# Patient Record
Sex: Female | Born: 1960 | Race: White | Hispanic: No | Marital: Married
Health system: Southern US, Academic
[De-identification: ages and names within clinical notes are randomized; demographics above are authoritative.]

## PROBLEM LIST (undated history)

## (undated) DIAGNOSIS — E039 Hypothyroidism, unspecified: Secondary | ICD-10-CM

## (undated) DIAGNOSIS — M329 Systemic lupus erythematosus, unspecified: Secondary | ICD-10-CM

## (undated) DIAGNOSIS — R569 Unspecified convulsions: Secondary | ICD-10-CM

## (undated) DIAGNOSIS — F32A Depression, unspecified: Secondary | ICD-10-CM

## (undated) DIAGNOSIS — R262 Difficulty in walking, not elsewhere classified: Secondary | ICD-10-CM

## (undated) DIAGNOSIS — E079 Disorder of thyroid, unspecified: Secondary | ICD-10-CM

## (undated) DIAGNOSIS — Z5189 Encounter for other specified aftercare: Secondary | ICD-10-CM

## (undated) DIAGNOSIS — G629 Polyneuropathy, unspecified: Secondary | ICD-10-CM

## (undated) DIAGNOSIS — IMO0002 Reserved for concepts with insufficient information to code with codable children: Secondary | ICD-10-CM

## (undated) DIAGNOSIS — S72402A Unspecified fracture of lower end of left femur, initial encounter for closed fracture: Secondary | ICD-10-CM

## (undated) DIAGNOSIS — D2121 Benign neoplasm of connective and other soft tissue of right lower limb, including hip: Secondary | ICD-10-CM

## (undated) DIAGNOSIS — G2581 Restless legs syndrome: Secondary | ICD-10-CM

## (undated) DIAGNOSIS — K3184 Gastroparesis: Secondary | ICD-10-CM

## (undated) HISTORY — PX: HERNIA REPAIR: SHX51

## (undated) HISTORY — PX: GASTRIC BYPASS: SHX52

## (undated) HISTORY — DX: Restless legs syndrome: G25.81

## (undated) HISTORY — PX: CHOLECYSTECTOMY: SHX55

## (undated) HISTORY — DX: Encounter for other specified aftercare: Z51.89

## (undated) HISTORY — DX: Unspecified fracture of lower end of left femur, initial encounter for closed fracture: S72.402A

## (undated) HISTORY — DX: Polyneuropathy, unspecified: G62.9

## (undated) HISTORY — PX: WISDOM TOOTH EXTRACTION: SHX21

## (undated) HISTORY — DX: Difficulty in walking, not elsewhere classified: R26.2

## (undated) HISTORY — DX: Benign neoplasm of connective and other soft tissue of right lower limb, including hip: D21.21

---

## 1998-01-20 ENCOUNTER — Ambulatory Visit (INDEPENDENT_AMBULATORY_CARE_PROVIDER_SITE_OTHER): Payer: Self-pay | Admitting: EXTERNAL

## 1998-04-19 ENCOUNTER — Ambulatory Visit (INDEPENDENT_AMBULATORY_CARE_PROVIDER_SITE_OTHER): Payer: Self-pay | Admitting: EXTERNAL

## 1998-09-01 ENCOUNTER — Ambulatory Visit (INDEPENDENT_AMBULATORY_CARE_PROVIDER_SITE_OTHER): Payer: Self-pay | Admitting: EXTERNAL

## 1999-02-09 ENCOUNTER — Ambulatory Visit (INDEPENDENT_AMBULATORY_CARE_PROVIDER_SITE_OTHER): Payer: Self-pay | Admitting: EXTERNAL

## 2000-02-04 ENCOUNTER — Ambulatory Visit (HOSPITAL_COMMUNITY): Payer: Self-pay

## 2000-07-17 ENCOUNTER — Ambulatory Visit (INDEPENDENT_AMBULATORY_CARE_PROVIDER_SITE_OTHER): Payer: Self-pay | Admitting: Neurological Surgery

## 2005-10-27 ENCOUNTER — Inpatient Hospital Stay (HOSPITAL_COMMUNITY): Payer: Self-pay | Admitting: Neurology

## 2013-04-29 ENCOUNTER — Other Ambulatory Visit (HOSPITAL_COMMUNITY): Payer: Self-pay | Admitting: EXTERNAL

## 2013-05-14 ENCOUNTER — Other Ambulatory Visit (HOSPITAL_COMMUNITY): Payer: Self-pay

## 2013-05-26 ENCOUNTER — Ambulatory Visit
Admission: RE | Admit: 2013-05-26 | Discharge: 2013-05-26 | Disposition: A | Discharge status: Home / Self Care | Source: Ambulatory Visit | Attending: EXTERNAL | Admitting: EXTERNAL

## 2013-05-26 DIAGNOSIS — H9319 Tinnitus, unspecified ear: Secondary | ICD-10-CM | POA: Insufficient documentation

## 2013-05-26 MED ORDER — IOPAMIDOL 370 MG IODINE/ML (76 %) INTRAVENOUS SOLUTION
140.00 mL | INTRAVENOUS | Status: AC
Start: 2013-05-26 — End: 2013-05-26
  Administered 2013-05-26: 140 mL via INTRAVENOUS

## 2013-09-05 ENCOUNTER — Inpatient Hospital Stay (HOSPITAL_COMMUNITY): Payer: Self-pay | Admitting: INTERNAL MEDICINE

## 2020-09-15 ENCOUNTER — Encounter: Payer: Self-pay | Admitting: Psychiatry

## 2020-09-15 ENCOUNTER — Emergency Department: Payer: 59

## 2020-09-15 ENCOUNTER — Inpatient Hospital Stay
Admission: EM | Admit: 2020-09-15 | Discharge: 2020-09-18 | DRG: 871 | Disposition: A | Discharge status: Home / Self Care | Payer: 59 | Attending: Internal Medicine | Admitting: Internal Medicine

## 2020-09-15 ENCOUNTER — Other Ambulatory Visit: Payer: Self-pay

## 2020-09-15 DIAGNOSIS — Z79890 Hormone replacement therapy (postmenopausal): Secondary | ICD-10-CM | POA: Diagnosis not present

## 2020-09-15 DIAGNOSIS — R112 Nausea with vomiting, unspecified: Secondary | ICD-10-CM

## 2020-09-15 DIAGNOSIS — K529 Noninfective gastroenteritis and colitis, unspecified: Secondary | ICD-10-CM | POA: Diagnosis not present

## 2020-09-15 DIAGNOSIS — R569 Unspecified convulsions: Secondary | ICD-10-CM | POA: Diagnosis not present

## 2020-09-15 DIAGNOSIS — R197 Diarrhea, unspecified: Secondary | ICD-10-CM | POA: Diagnosis present

## 2020-09-15 DIAGNOSIS — Z888 Allergy status to other drugs, medicaments and biological substances status: Secondary | ICD-10-CM | POA: Diagnosis not present

## 2020-09-15 DIAGNOSIS — A419 Sepsis, unspecified organism: Secondary | ICD-10-CM | POA: Diagnosis present

## 2020-09-15 DIAGNOSIS — G40909 Epilepsy, unspecified, not intractable, without status epilepticus: Secondary | ICD-10-CM | POA: Diagnosis not present

## 2020-09-15 DIAGNOSIS — G40409 Other generalized epilepsy and epileptic syndromes, not intractable, without status epilepticus: Secondary | ICD-10-CM | POA: Diagnosis present

## 2020-09-15 DIAGNOSIS — Z79899 Other long term (current) drug therapy: Secondary | ICD-10-CM | POA: Diagnosis not present

## 2020-09-15 DIAGNOSIS — E039 Hypothyroidism, unspecified: Secondary | ICD-10-CM | POA: Diagnosis present

## 2020-09-15 DIAGNOSIS — E860 Dehydration: Secondary | ICD-10-CM | POA: Diagnosis present

## 2020-09-15 DIAGNOSIS — D509 Iron deficiency anemia, unspecified: Secondary | ICD-10-CM | POA: Diagnosis present

## 2020-09-15 DIAGNOSIS — E669 Obesity, unspecified: Secondary | ICD-10-CM | POA: Diagnosis present

## 2020-09-15 DIAGNOSIS — J181 Lobar pneumonia, unspecified organism: Secondary | ICD-10-CM | POA: Diagnosis not present

## 2020-09-15 DIAGNOSIS — Z9884 Bariatric surgery status: Secondary | ICD-10-CM | POA: Diagnosis not present

## 2020-09-15 DIAGNOSIS — Z20822 Contact with and (suspected) exposure to covid-19: Secondary | ICD-10-CM | POA: Diagnosis present

## 2020-09-15 DIAGNOSIS — Z6832 Body mass index (BMI) 32.0-32.9, adult: Secondary | ICD-10-CM | POA: Diagnosis not present

## 2020-09-15 DIAGNOSIS — I10 Essential (primary) hypertension: Secondary | ICD-10-CM | POA: Diagnosis present

## 2020-09-15 DIAGNOSIS — J189 Pneumonia, unspecified organism: Secondary | ICD-10-CM | POA: Diagnosis not present

## 2020-09-15 DIAGNOSIS — J690 Pneumonitis due to inhalation of food and vomit: Secondary | ICD-10-CM | POA: Diagnosis present

## 2020-09-15 HISTORY — DX: Gastroparesis: K31.84

## 2020-09-15 HISTORY — DX: Unspecified convulsions: R56.9

## 2020-09-15 LAB — URINALYSIS, COMPLETE (UACMP) WITH MICROSCOPIC
Bacteria, UA: NONE SEEN
Bilirubin Urine: NEGATIVE
Glucose, UA: NEGATIVE mg/dL
Hgb urine dipstick: NEGATIVE
Ketones, ur: NEGATIVE mg/dL
Leukocytes,Ua: NEGATIVE
Nitrite: NEGATIVE
Protein, ur: NEGATIVE mg/dL
Specific Gravity, Urine: 1.023 (ref 1.005–1.030)
pH: 7 (ref 5.0–8.0)

## 2020-09-15 LAB — CBC WITH DIFFERENTIAL/PLATELET
Abs Immature Granulocytes: 0.06 10*3/uL (ref 0.00–0.07)
Basophils Absolute: 0 10*3/uL (ref 0.0–0.1)
Basophils Relative: 0 %
Eosinophils Absolute: 0 10*3/uL (ref 0.0–0.5)
Eosinophils Relative: 0 %
HCT: 32.6 % — ABNORMAL LOW (ref 36.0–46.0)
Hemoglobin: 10.3 g/dL — ABNORMAL LOW (ref 12.0–15.0)
Immature Granulocytes: 0 %
Lymphocytes Relative: 8 %
Lymphs Abs: 1.2 10*3/uL (ref 0.7–4.0)
MCH: 26.9 pg (ref 26.0–34.0)
MCHC: 31.6 g/dL (ref 30.0–36.0)
MCV: 85.1 fL (ref 80.0–100.0)
Monocytes Absolute: 0.5 10*3/uL (ref 0.1–1.0)
Monocytes Relative: 3 %
Neutro Abs: 13.1 10*3/uL — ABNORMAL HIGH (ref 1.7–7.7)
Neutrophils Relative %: 89 %
Platelets: 341 10*3/uL (ref 150–400)
RBC: 3.83 MIL/uL — ABNORMAL LOW (ref 3.87–5.11)
RDW: 15.2 % (ref 11.5–15.5)
WBC: 14.8 10*3/uL — ABNORMAL HIGH (ref 4.0–10.5)
nRBC: 0 % (ref 0.0–0.2)

## 2020-09-15 LAB — CBG MONITORING, ED: Glucose-Capillary: 79 mg/dL (ref 70–99)

## 2020-09-15 LAB — RESPIRATORY PANEL BY RT PCR (FLU A&B, COVID)
Influenza A by PCR: NEGATIVE
Influenza B by PCR: NEGATIVE
SARS Coronavirus 2 by RT PCR: NEGATIVE

## 2020-09-15 LAB — COMPREHENSIVE METABOLIC PANEL
ALT: 20 U/L (ref 0–44)
AST: 21 U/L (ref 15–41)
Albumin: 3.6 g/dL (ref 3.5–5.0)
Alkaline Phosphatase: 119 U/L (ref 38–126)
Anion gap: 10 (ref 5–15)
BUN: 14 mg/dL (ref 6–20)
CO2: 23 mmol/L (ref 22–32)
Calcium: 8.3 mg/dL — ABNORMAL LOW (ref 8.9–10.3)
Chloride: 109 mmol/L (ref 98–111)
Creatinine, Ser: 0.88 mg/dL (ref 0.44–1.00)
GFR, Estimated: >60 mL/min (ref >60)
Glucose, Bld: 119 mg/dL — ABNORMAL HIGH (ref 70–99)
Potassium: 3.8 mmol/L (ref 3.5–5.1)
Sodium: 142 mmol/L (ref 135–145)
Total Bilirubin: 0.6 mg/dL (ref 0.3–1.2)
Total Protein: 6.7 g/dL (ref 6.5–8.1)

## 2020-09-15 LAB — LACTIC ACID, PLASMA
Lactic Acid, Venous: 2.2 mmol/L (ref 0.5–1.9)
Lactic Acid, Venous: 1.4 mmol/L (ref 0.5–1.9)
Lactic Acid, Venous: 2 mmol/L (ref 0.5–1.9)

## 2020-09-15 LAB — LIPASE, BLOOD: Lipase: 29 U/L (ref 11–51)

## 2020-09-15 LAB — PROCALCITONIN: Procalcitonin: 6.05 ng/mL

## 2020-09-15 LAB — PHOSPHORUS: Phosphorus: 3.5 mg/dL (ref 2.5–4.6)

## 2020-09-15 LAB — MAGNESIUM: Magnesium: 1.6 mg/dL — ABNORMAL LOW (ref 1.7–2.4)

## 2020-09-15 MED ORDER — SODIUM CHLORIDE 0.9 % IV BOLUS
500.0000 mL | Freq: Once | INTRAVENOUS | Status: AC
  Administered 2020-09-15: 500 mL via INTRAVENOUS

## 2020-09-15 MED ORDER — ACETAMINOPHEN 325 MG PO TABS
650.0000 mg | ORAL_TABLET | Freq: Four times a day (QID) | ORAL | Status: DC | PRN
  Administered 2020-09-15 – 2020-09-16 (×3): 650 mg via ORAL
  Filled 2020-09-15 (×3): qty 2

## 2020-09-15 MED ORDER — MAGNESIUM SULFATE 2 GM/50ML IV SOLN: 2.0000 g | Freq: Once | INTRAVENOUS | Status: DC

## 2020-09-15 MED ORDER — ONDANSETRON HCL 4 MG/2ML IJ SOLN
4.0000 mg | Freq: Four times a day (QID) | INTRAMUSCULAR | Status: DC | PRN
  Administered 2020-09-16 – 2020-09-18 (×2): 4 mg via INTRAVENOUS
  Filled 2020-09-15 (×2): qty 2

## 2020-09-15 MED ORDER — LACTATED RINGERS IV BOLUS
1000.0000 mL | Freq: Once | INTRAVENOUS | Status: AC
  Administered 2020-09-15: 1000 mL via INTRAVENOUS

## 2020-09-15 MED ORDER — MORPHINE SULFATE (PF) 2 MG/ML IV SOLN
2.0000 mg | Freq: Once | INTRAVENOUS | Status: AC
  Administered 2020-09-15: 2 mg via INTRAVENOUS
  Filled 2020-09-15: qty 1

## 2020-09-15 MED ORDER — PROMETHAZINE HCL 25 MG PO TABS
25.0000 mg | ORAL_TABLET | Freq: Three times a day (TID) | ORAL | Status: DC | PRN
  Administered 2020-09-16 – 2020-09-18 (×6): 25 mg via ORAL
  Filled 2020-09-15 (×8): qty 1

## 2020-09-15 MED ORDER — ACETAMINOPHEN 160 MG/5ML PO SOLN
1000.0000 mg | Freq: Once | ORAL | Status: AC
  Administered 2020-09-15: 1000 mg via ORAL
  Filled 2020-09-15: qty 40.6

## 2020-09-15 MED ORDER — SODIUM CHLORIDE 0.9 % IV BOLUS
1000.0000 mL | Freq: Once | INTRAVENOUS | Status: AC
  Administered 2020-09-15: 1000 mL via INTRAVENOUS

## 2020-09-15 MED ORDER — PRAMIPEXOLE DIHYDROCHLORIDE 0.25 MG PO TABS
0.7500 mg | ORAL_TABLET | Freq: Every day | ORAL | Status: DC
  Administered 2020-09-15 – 2020-09-17 (×3): 0.75 mg via ORAL
  Filled 2020-09-15 (×4): qty 3

## 2020-09-15 MED ORDER — ONDANSETRON HCL 4 MG/2ML IJ SOLN
4.0000 mg | Freq: Once | INTRAMUSCULAR | Status: AC
  Administered 2020-09-15: 4 mg via INTRAVENOUS
  Filled 2020-09-15: qty 2

## 2020-09-15 MED ORDER — SODIUM CHLORIDE 0.9 % IV SOLN
2.0000 g | Freq: Once | INTRAVENOUS | Status: AC
  Administered 2020-09-15: 2 g via INTRAVENOUS
  Filled 2020-09-15: qty 2

## 2020-09-15 MED ORDER — ENOXAPARIN SODIUM 60 MG/0.6ML ~~LOC~~ SOLN
0.5000 mg/kg | SUBCUTANEOUS | Status: DC
  Administered 2020-09-15 – 2020-09-17 (×3): 50 mg via SUBCUTANEOUS
  Filled 2020-09-15 (×3): qty 0.6

## 2020-09-15 MED ORDER — VANCOMYCIN HCL 1250 MG/250ML IV SOLN
1250.0000 mg | Freq: Two times a day (BID) | INTRAVENOUS | Status: DC
  Administered 2020-09-16 – 2020-09-17 (×3): 1250 mg via INTRAVENOUS
  Filled 2020-09-15 (×4): qty 250

## 2020-09-15 MED ORDER — MAGNESIUM OXIDE 400 (241.3 MG) MG PO TABS
400.0000 mg | ORAL_TABLET | Freq: Two times a day (BID) | ORAL | Status: DC
  Administered 2020-09-15 – 2020-09-17 (×4): 400 mg via ORAL
  Filled 2020-09-15 (×4): qty 1

## 2020-09-15 MED ORDER — IOHEXOL 300 MG/ML  SOLN
100.0000 mL | Freq: Once | INTRAMUSCULAR | Status: AC | PRN
  Administered 2020-09-15: 100 mL via INTRAVENOUS

## 2020-09-15 MED ORDER — IOHEXOL 9 MG/ML PO SOLN
1000.0000 mL | Freq: Once | ORAL | Status: AC
  Administered 2020-09-15: 500 mL via ORAL

## 2020-09-15 MED ORDER — ZONISAMIDE 100 MG PO CAPS
100.0000 mg | ORAL_CAPSULE | Freq: Every day | ORAL | Status: DC
  Administered 2020-09-15 – 2020-09-18 (×4): 100 mg via ORAL
  Filled 2020-09-15 (×4): qty 1

## 2020-09-15 MED ORDER — SODIUM CHLORIDE 0.9 % IV SOLN
2.0000 g | Freq: Three times a day (TID) | INTRAVENOUS | Status: DC
  Administered 2020-09-15 – 2020-09-18 (×8): 2 g via INTRAVENOUS
  Filled 2020-09-15 (×10): qty 2

## 2020-09-15 MED ORDER — SODIUM CHLORIDE 0.9 % IV SOLN: 1.0000 g | Freq: Three times a day (TID) | INTRAVENOUS | Status: DC

## 2020-09-15 MED ORDER — LACTATED RINGERS IV SOLN: INTRAVENOUS | Status: AC

## 2020-09-15 MED ORDER — LEVOTHYROXINE SODIUM 50 MCG PO TABS
75.0000 ug | ORAL_TABLET | Freq: Every day | ORAL | Status: DC
  Administered 2020-09-16: 75 ug via ORAL
  Filled 2020-09-15: qty 1

## 2020-09-15 MED ORDER — VANCOMYCIN HCL 2000 MG/400ML IV SOLN
2000.0000 mg | Freq: Once | INTRAVENOUS | Status: AC
  Administered 2020-09-15: 17:00:00 2000 mg via INTRAVENOUS
  Filled 2020-09-15: qty 400

## 2020-09-15 MED ORDER — DIAZEPAM 5 MG PO TABS
10.0000 mg | ORAL_TABLET | Freq: Every day | ORAL | Status: DC
  Administered 2020-09-15 – 2020-09-17 (×3): 10 mg via ORAL
  Filled 2020-09-15 (×3): qty 2

## 2020-09-15 MED ORDER — DIAZEPAM 10 MG RE GEL: 10.0000 mg | Freq: Every day | RECTAL | Status: DC | PRN

## 2020-09-15 MED ORDER — TOPIRAMATE 100 MG PO TABS
100.0000 mg | ORAL_TABLET | Freq: Three times a day (TID) | ORAL | Status: DC
  Administered 2020-09-15 – 2020-09-18 (×8): 100 mg via ORAL
  Filled 2020-09-15 (×10): qty 1

## 2020-09-15 MED ORDER — ENOXAPARIN SODIUM 40 MG/0.4ML ~~LOC~~ SOLN: 40.0000 mg | SUBCUTANEOUS | Status: DC

## 2020-09-15 NOTE — Progress Notes (Signed)
Following for code sepsis 

## 2020-09-15 NOTE — Progress Notes (Signed)
CODE SEPSIS - PHARMACY COMMUNICATION  **Broad Spectrum Antibiotics should be administered within 1 hour of Sepsis diagnosis**  Time Code Sepsis Called/Page Received: @ 1359  Antibiotics Ordered: Cefepime and Vancomycin   Time of 1st antibiotic administration: @ 1431  Additional action taken by pharmacy: N/A  If necessary, Name of Provider/Nurse Contacted: N/A   Gardner Candle, PharmD, BCPS Clinical Pharmacist 09/15/2020 2:01 PM

## 2020-09-15 NOTE — Plan of Care (Signed)
  Problem: Education: Goal: Knowledge of General Education information will improve Description: Including pain rating scale, medication(s)/side effects and non-pharmacologic comfort measures Outcome: Progressing   Problem: Activity: Goal: Ability to tolerate increased activity will improve Outcome: Progressing   Problem: Clinical Measurements: Goal: Ability to maintain a body temperature in the normal range will improve Outcome: Progressing   Problem: Respiratory: Goal: Ability to maintain adequate ventilation will improve Outcome: Progressing   Problem: Respiratory: Goal: Ability to maintain a clear airway will improve Outcome: Progressing   Problem: Fluid Volume: Goal: Hemodynamic stability will improve Outcome: Progressing   

## 2020-09-15 NOTE — ED Notes (Signed)
MD at bedside. 

## 2020-09-15 NOTE — ED Triage Notes (Signed)
Patient arrived via EMS due to N/V per EMS and patient mother patient woke up in the middle of the night, was shaky, patient has a hx of seizures, not sure if she had a seizure. Patient is here from [place] was traveling with family. VSS

## 2020-09-15 NOTE — ED Notes (Signed)
Attempted to call report - placed on hold with no answer

## 2020-09-15 NOTE — Progress Notes (Signed)
PHARMACY -  BRIEF ANTIBIOTIC NOTE   Pharmacy has received consult(s) for Cefepime and vancomycin from an ED provider.  The patient's profile has been reviewed for ht/wt/allergies/indication/available labs.    One time order(s) placed for Vancomycin 2000mg  and Cefepime 2g IV   Further antibiotics/pharmacy consults should be ordered by admitting physician if indicated.                       Thank you, , PharmD, BCPS Clinical Pharmacist 09/15/2020 1:59 PM

## 2020-09-15 NOTE — ED Provider Notes (Signed)
Patient reports she is starting to feel improvement.  Nausea has improved no longer having any pain or discomfort.  Alert resting comfortably, patient's niece who is a Advice worker also at the bedside.  Reviewed labs with the patient and work-up to this point.   Sharyn Creamer, MD 09/15/20 1140

## 2020-09-15 NOTE — ED Provider Notes (Signed)
[facility] Emergency Department Provider Note ____________________________________________   First MD Initiated Contact with Patient 09/15/20 [phone]     (approximate)  I have reviewed the triage vital signs and the nursing notes.   HISTORY  Chief Complaint Nausea   HPI Hooria Gasparini is a 59 y.o. female history of epilepsy, seizure disorder, and gastroparesis with previous gastric bypass  Patient traveling from [place].  She reports yesterday she felt tired a little more fatigued than usual while traveling here to pick up her grandchildren she reports that she suddenly had severe abdominal cramps explosive diarrhea  yesterday afternoon, then continued her travel here and last night vomited once.  She is continued to dry heave and feels a sensation as though her bowels are moving in her left upper abdomen.  She has continued to pass gas and continue to have a couple loose nonbloody stools.  She has not noticed any fever.  Denies Covid exposure.  Does have a history of seizure disorder with severe seizures in the past, does not believe she had any seizure yesterday but says it is possible she could have a she has multiple different types of seizures but felt more like she was really having vomiting.  She does not have her medications with her but reports she obtains them at [facility] in [place]  Past Medical History:  Diagnosis Date  . Gastroparesis   . Seizures [facility])     Patient Active Problem List   Diagnosis Date Noted  . Sepsis (HCC) 09/15/2020      Prior to Admission medications   Medication Sig Start Date End Date Taking? Authorizing Provider  cyanocobalamin (,VITAMIN B-12,) 1000 MCG/ML injection Inject 1,000 mcg into the muscle every 14 (fourteen) days.   Yes [provider]  diazepam (DIASTAT ACUDIAL) 10 MG GEL Place 10 mg rectally daily as needed for seizure.   Yes [provider]  diazepam (VALIUM) 5 MG tablet Take 10 mg  by mouth at bedtime.   Yes [provider]  levothyroxine (SYNTHROID) 75 MCG tablet Take 75 mcg by mouth daily. 08/19/20  Yes [provider]  pramipexole (MIRAPEX) 0.75 MG tablet Take 0.75 mg by mouth at bedtime. 07/27/20  Yes [provider]  promethazine (PHENERGAN) 25 MG tablet Take 25 mg by mouth 3 (three) times daily as needed for nausea/vomiting. 08/15/20  Yes [provider]  topiramate (TOPAMAX) 100 MG tablet Take 100 mg by mouth 3 (three) times daily.   Yes [provider]  zonisamide (ZONEGRAN) 50 MG capsule Take 100 mg by mouth daily.  06/19/20  Yes [provider]    Allergies Reglan [metoclopramide]  No family history on file.  Social History Social History   Tobacco Use  . Smoking status: Not on file  Substance Use Topics  . Alcohol use: Not on file  . Drug use: Not on file  No active drug abuse, no alcohol use today  Review of Systems Constitutional: No fever/chills Eyes: No visual changes. ENT: No sore throat. Cardiovascular: Denies chest pain. Respiratory: Denies shortness of breath. Gastrointestinal: No abdominal pain.   Genitourinary: Negative for dysuria. Musculoskeletal: Negative for back pain. Skin: Negative for rash. Neurological: Negative for headaches, areas of focal weakness or numbness.    ____________________________________________   PHYSICAL EXAM:  VITAL SIGNS: ED Triage Vitals  Enc Vitals Group     BP 09/15/20 0956 123/67     Pulse Rate 09/15/20 0956 (!) 104     Resp  09/15/20 0956 16     Temp 09/15/20 0956 (!) 100.4 F (38 C)     Temp Source 09/15/20 0956 Oral     SpO2 09/15/20 0951 98 %     Weight 09/15/20 1006 220 lb (99.8 kg)     Height 09/15/20 1006 5\' 9"  (1.753 m)     Head Circumference --      Peak Flow --      Pain Score 09/15/20 0956 6     Pain Loc --      Pain Edu? --      Excl. in GC? --     Constitutional: Alert and oriented. Well appearing and in no acute  distress. Eyes: Conjunctivae are normal. Head: Atraumatic. Nose: No congestion/rhinnorhea. Mouth/Throat: Mucous membranes are quite dry.  Patient reports she has not had anything to eat since last night at all Neck: No stridor.  Cardiovascular: Normal rate, regular rhythm. Grossly normal heart sounds.  Good peripheral circulation. Respiratory: Normal respiratory effort.  No retractions. Lungs CTAB. Gastrointestinal: Soft and mildly tender throughout but no obvious focal discomfort. No distention.  No rebound or guarding. Musculoskeletal: No lower extremity tenderness nor edema. Neurologic:  Normal speech and language. No gross focal neurologic deficits are appreciated.  Skin:  Skin is warm, dry and intact. No rash noted. Psychiatric: Mood and affect are normal. Speech and behavior are normal.  ____________________________________________   LABS (all labs ordered are listed, but only abnormal results are displayed)  Labs Reviewed  CBC WITH DIFFERENTIAL/PLATELET - Abnormal; Notable for the following components:      Result Value   WBC 14.8 (*)    RBC 3.83 (*)    Hemoglobin 10.3 (*)    HCT 32.6 (*)    Neutro Abs 13.1 (*)    All other components within normal limits  COMPREHENSIVE METABOLIC PANEL - Abnormal; Notable for the following components:   Glucose, Bld 119 (*)    Calcium 8.3 (*)    All other components within normal limits  URINALYSIS, COMPLETE (UACMP) WITH MICROSCOPIC - Abnormal; Notable for the following components:   Color, Urine YELLOW (*)    APPearance CLEAR (*)    All other components within normal limits  LACTIC ACID, PLASMA - Abnormal; Notable for the following components:   Lactic Acid, Venous 2.2 (*)    All other components within normal limits  MAGNESIUM - Abnormal; Notable for the following components:   Magnesium 1.6 (*)    All other components within normal limits  CULTURE, BLOOD (SINGLE)  RESPIRATORY PANEL BY RT PCR (FLU A&B, COVID)  CULTURE, BLOOD  (SINGLE)  URINE CULTURE  GASTROINTESTINAL PANEL BY PCR, STOOL (REPLACES STOOL CULTURE)  GASTROINTESTINAL PANEL BY PCR, STOOL (REPLACES STOOL CULTURE)  LIPASE, BLOOD  LACTIC ACID, PLASMA  PHOSPHORUS  LACTIC ACID, PLASMA  PROCALCITONIN  CBG MONITORING, ED   ____________________________________________  ____________________________________________  RADIOLOGY  CT Head Wo Contrast  Result Date: 09/15/2020 CLINICAL DATA:  Headache.  History of seizures EXAM: CT HEAD WITHOUT CONTRAST TECHNIQUE: Contiguous axial images were obtained from the base of the skull through the vertex without intravenous contrast. COMPARISON:  None. FINDINGS: Brain: The ventricles are normal in size and configuration. There is symmetric mild frontal atrophy bilaterally. There is no intracranial mass, hemorrhage, extra-axial fluid collection, or midline shift. Brain parenchyma appears unremarkable. No demonstrable acute infarct. Vascular: No hyperdense mass. There are foci of calcification in the carotid siphon regions. Skull: Bony calvarium appears intact. Sinuses/Orbits: There is mucosal thickening involving several  ethmoid air cells. Other visualized paranasal sinuses are clear. Visualized orbits appear symmetric bilaterally. Other: Mastoid air cells are clear. IMPRESSION: Mild frontal atrophy bilaterally.  Ventricles appear normal. Brain parenchyma appears unremarkable.  No mass or hemorrhage. There are foci of arterial vascular calcification. There is mucosal thickening in several ethmoid air cells. Electronically Signed   By: Bretta BangWilliam  Woodruff III M.D.   On: 09/15/2020 13:38   CT ABDOMEN PELVIS W CONTRAST  Result Date: 09/15/2020 CLINICAL DATA:  Nausea, vomiting. EXAM: CT ABDOMEN AND PELVIS WITH CONTRAST TECHNIQUE: Multidetector CT imaging of the abdomen and pelvis was performed using the standard protocol following bolus administration of intravenous contrast. CONTRAST:  100mL OMNIPAQUE IOHEXOL 300 MG/ML  SOLN  COMPARISON:  None. FINDINGS: Lower chest: Mild airspace opacities are noted in the right lung base concerning for possible pneumonia. Moderate size sliding-type hiatal hernia is noted. Hepatobiliary: No focal liver abnormality is seen. Status post cholecystectomy. No biliary dilatation. Pancreas: Unremarkable. No pancreatic ductal dilatation or surrounding inflammatory changes. Spleen: Normal in size without focal abnormality. Adrenals/Urinary Tract: Adrenal glands are unremarkable. Kidneys are normal, without renal calculi, focal lesion, or hydronephrosis. Bladder is unremarkable. Stomach/Bowel: Status post gastric bypass. There is no evidence of bowel obstruction or inflammation. The appendix is unremarkable. Vascular/Lymphatic: Aortic atherosclerosis. No enlarged abdominal or pelvic lymph nodes. Reproductive: Uterus and bilateral adnexa are unremarkable. Other: No abdominal wall hernia or abnormality. No abdominopelvic ascites. Musculoskeletal: No acute or significant osseous findings. IMPRESSION: 1. Mild airspace opacities are noted in the right lung base concerning for possible pneumonia. 2. Moderate size sliding-type hiatal hernia. 3. Status post gastric bypass. 4. No other abnormality seen in the abdomen or pelvis. 5. Aortic atherosclerosis. Aortic Atherosclerosis (ICD10-I70.0). Electronically Signed   By: Lupita RaiderJames  Green Jr M.D.   On: 09/15/2020 13:43   DG Chest Portable 1 View  Result Date: 09/15/2020 CLINICAL DATA:  Sepsis. EXAM: PORTABLE CHEST 1 VIEW COMPARISON:  None. FINDINGS: The heart size and mediastinal contours are within normal limits. Both lungs are clear. The visualized skeletal structures are unremarkable. IMPRESSION: No active disease. Electronically Signed   By: Lupita RaiderJames  Green Jr M.D.   On: 09/15/2020 14:17    Imaging reviewed, no acute intracranial findings are denoted on CT.  Abdomen pelvis demonstrate possible findings of pneumonia but no acute intra-abdominal pathology is identified.   Incidental findings as noted above.  Chest x-ray is noted as clear ____________________________________________   PROCEDURES  Procedure(s) performed: None  Procedures  Critical Care performed: No  ____________________________________________   INITIAL IMPRESSION / ASSESSMENT AND PLAN / ED COURSE  Pertinent labs & imaging results that were available during my care of the patient were reviewed by me and considered in my medical decision making (see chart for details).   Differential diagnosis includes but is not limited to, abdominal perforation, aortic dissection, cholecystitis, appendicitis, diverticulitis, colitis, esophagitis/gastritis, kidney stone, pyelonephritis, urinary tract infection, aortic aneurysm. All are considered in decision and treatment plan. Based upon the patient's presentation and risk factors, and his presentation of nausea vomiting crampy abdominal pain loose stools sounds likely infectious in nature.  She does however have a history of previous gastric bypass and I believe CT imaging would be helpful.  She also reports associated headache, but does have a history of headaches, but in consideration other etiologies of refractory nausea vomiting and headache could be representative of central neurologic etiology such as intracranial hemorrhage, mass, etc.  This however is lower on my differential but discussed with the patient  we will proceed with imaging of the head to help exclude this, though I suspect most likely etiology be abdominal  No acute pulmonary or cardiac symptoms.  No chest pain.  Reassuring vascular exam of distal extremities     ----------------------------------------- 2:00 PM on 09/15/2020 -----------------------------------------  Patient's blood pressure seems to have dropped consistently, now 82/48.  Per her and her niece she does have a history of running low blood pressures but not in the 80s.  She is awake alert fully oriented.  Her CT scan  demonstrating possible pneumonia, have ordered chest x-ray as well to evaluate.  Covid test negative.  Elevated white count, concern for sepsis given fever, hypotension and source is still not clearly delineated though may be pulmonary patient family does report did notice she had a cough yesterday which she does not typically have.  I have placed admission request to hospitalist Lucianne Muss    ____________________________________________   FINAL CLINICAL IMPRESSION(S) / ED DIAGNOSES  Final diagnoses:  Severe sepsis (HCC)  Nausea vomiting and diarrhea        Note:  This document was prepared using Dragon voice recognition software and may include unintentional dictation errors       Sharyn Creamer, MD 09/15/20 1619

## 2020-09-15 NOTE — Progress Notes (Signed)
Notified bedside nurse of need to draw repeat lactic acid. 

## 2020-09-15 NOTE — H&P (Signed)
[facility] History and Physical   Patient: Chelsea Pena ZOX:[id number]   PCP: System, Provider Not In DOB: [birth year]   DOA: 09/15/2020   DOS: 09/15/2020   DOS: the patient was seen and examined on 09/15/2020   Patient coming from: The patient is coming from Home  Chief Complaint: Abdominal pain, diarrhea nausea and vomiting, possible seizures and headache  HPI: Chelsea BucksCindy Pounds is a 59 y.o. female with Past medical history of seizure disorder, lupus, hypothyroid,  gastroparesis status post gastric bypass, as reviewed from EMR, presented [facility] ED due to complaining of abdominal pain and diarrhea which started yesterday [place] and on the way she stopped and had huge bowel movement, she was feeling sick and did not eat much yesterday.  Patient was feeling nauseous and vomited last night.  Today morning patient was feeling sick as well nauseous and vomited and she feels like she was shaking might had an episode of seizures but she is not sure, not sure about passing out or loss of consciousness.  Patient was complaining of headache and also stated that she is being having cough for the past 1 to 2 days but no history of fever.  Patient is vaccinated against Covid 19 and she received booster dose on Monday, patient also received right hip steroid injection on Monday.   ED Course: Hypertensive, low blood pressure, fluid bolus was given in the ED as per sepsis protocol, chest x-ray consistent with right lower lobe infiltrates, WBC count 14.8, lactic acid 2.2 GI pathogen and C. difficile pending Follow procalcitonin Follow blood cultures Patient admitted for further management for sepsis as bellow     Review of Systems: as mentioned in the history of present illness.  All other systems reviewed and are negative.  Past Medical History:  Diagnosis Date  . Gastroparesis   . Seizures (HCC)    The histories are not reviewed yet. Please review them in the  "History" navigator section and refresh this SmartLink. Social History: Patient denied smoking, drug abuse and alcohol abuse.   Allergies  Allergen Reactions  . Reglan [Metoclopramide] Other (See Comments)    Tardive dyskinesia      Family history reviewed and not pertinent   Prior to Admission medications   Not on File    Physical Exam: Vitals:   09/15/20 1445 09/15/20 1500 09/15/20 1515 09/15/20 1558  BP: 100/62   (!) 106/55  Pulse: 73 73 74 70  Resp: (!) 21 19 (!) 23 17  Temp:    98.7 F (37.1 C)  TempSrc:    Oral  SpO2: 100% 100% 100% 100%  Weight:      Height:        General: alert and oriented to time, place, and person. Appear in mild distress, affect appropriate Eyes: PERRLA, Conjunctiva normal ENT: Oral Mucosa Clear, dry  Neck: No JVD, no Abnormal Mass Or lumps Cardiovascular: S1 and S2 Present, no Murmur, peripheral pulses symmetrical Respiratory: good respiratory effort, Bilateral Air entry equal and Decreased, no signs of accessory muscle use, Clear to Auscultation, no Crackles, no wheezes Abdomen: Bowel Sound present, Soft and no tenderness, no hernia Skin: no rashes  Extremities: no Pedal edema, no calf tenderness Neurologic: without any new focal findings Gait not checked due to patient safety concerns  Data Reviewed: I have personally reviewed and interpreted labs, imaging as discussed below.  CBC: Recent Labs  Lab 09/15/20 1000  WBC 14.8*  NEUTROABS 13.1*  HGB 10.3*  HCT 32.6*  MCV 85.1  PLT 341   Basic Metabolic Panel: Recent Labs  Lab 09/15/20 1000 09/15/20 1430  NA 142  --   K 3.8  --   CL 109  --   CO2 23  --   GLUCOSE 119*  --   BUN 14  --   CREATININE 0.88  --   CALCIUM 8.3*  --   MG  --  1.6*  PHOS  --  3.5   GFR: Estimated Creatinine Clearance: 86.5 mL/min (by C-G formula based on SCr of 0.88 mg/dL). Liver Function Tests: Recent Labs  Lab 09/15/20 1000  AST 21  ALT 20  ALKPHOS 119  BILITOT 0.6  PROT 6.7   ALBUMIN 3.6   Recent Labs  Lab 09/15/20 1000  LIPASE 29   No results for input(s): AMMONIA in the last 168 hours. Coagulation Profile: No results for input(s): INR, PROTIME in the last 168 hours. Cardiac Enzymes: No results for input(s): CKTOTAL, CKMB, CKMBINDEX, TROPONINI in the last 168 hours. BNP (last 3 results) No results for input(s): PROBNP in the last 8760 hours. HbA1C: No results for input(s): HGBA1C in the last 72 hours. CBG: Recent Labs  Lab 09/15/20 0948  GLUCAP 79   Lipid Profile: No results for input(s): CHOL, HDL, LDLCALC, TRIG, CHOLHDL, LDLDIRECT in the last 72 hours. Thyroid Function Tests: No results for input(s): TSH, T4TOTAL, FREET4, T3FREE, THYROIDAB in the last 72 hours. Anemia Panel: No results for input(s): VITAMINB12, FOLATE, FERRITIN, TIBC, IRON, RETICCTPCT in the last 72 hours. Urine analysis:    Component Value Date/Time   COLORURINE YELLOW (A) 09/15/2020 1246   APPEARANCEUR CLEAR (A) 09/15/2020 1246   LABSPEC 1.023 09/15/2020 1246   PHURINE 7.0 09/15/2020 1246   GLUCOSEU NEGATIVE 09/15/2020 1246   HGBUR NEGATIVE 09/15/2020 1246   BILIRUBINUR NEGATIVE 09/15/2020 1246   KETONESUR NEGATIVE 09/15/2020 1246   PROTEINUR NEGATIVE 09/15/2020 1246   NITRITE NEGATIVE 09/15/2020 1246   LEUKOCYTESUR NEGATIVE 09/15/2020 1246    Radiological Exams on Admission: CT Head Wo Contrast  Result Date: 09/15/2020 CLINICAL DATA:  Headache.  History of seizures EXAM: CT HEAD WITHOUT CONTRAST TECHNIQUE: Contiguous axial images were obtained from the base of the skull through the vertex without intravenous contrast. COMPARISON:  None. FINDINGS: Brain: The ventricles are normal in size and configuration. There is symmetric mild frontal atrophy bilaterally. There is no intracranial mass, hemorrhage, extra-axial fluid collection, or midline shift. Brain parenchyma appears unremarkable. No demonstrable acute infarct. Vascular: No hyperdense mass. There are foci of  calcification in the carotid siphon regions. Skull: Bony calvarium appears intact. Sinuses/Orbits: There is mucosal thickening involving several ethmoid air cells. Other visualized paranasal sinuses are clear. Visualized orbits appear symmetric bilaterally. Other: Mastoid air cells are clear. IMPRESSION: Mild frontal atrophy bilaterally.  Ventricles appear normal. Brain parenchyma appears unremarkable.  No mass or hemorrhage. There are foci of arterial vascular calcification. There is mucosal thickening in several ethmoid air cells. Electronically Signed   By: Bretta Bang III M.D.   On: 09/15/2020 13:38   CT ABDOMEN PELVIS W CONTRAST  Result Date: 09/15/2020 CLINICAL DATA:  Nausea, vomiting. EXAM: CT ABDOMEN AND PELVIS WITH CONTRAST TECHNIQUE: Multidetector CT imaging of the abdomen and pelvis was performed using the standard protocol following bolus administration of intravenous contrast. CONTRAST:  OMNIPAQUE IOHEXOL 300 MG/ML  SOLN COMPARISON:  None. FINDINGS: Lower chest: Mild airspace opacities are noted in the right lung base concerning for possible pneumonia. Moderate size sliding-type hiatal  hernia is noted. Hepatobiliary: No focal liver abnormality is seen. Status post cholecystectomy. No biliary dilatation. Pancreas: Unremarkable. No pancreatic ductal dilatation or surrounding inflammatory changes. Spleen: Normal in size without focal abnormality. Adrenals/Urinary Tract: Adrenal glands are unremarkable. Kidneys are normal, without renal calculi, focal lesion, or hydronephrosis. Bladder is unremarkable. Stomach/Bowel: Status post gastric bypass. There is no evidence of bowel obstruction or inflammation. The appendix is unremarkable. Vascular/Lymphatic: Aortic atherosclerosis. No enlarged abdominal or pelvic lymph nodes. Reproductive: Uterus and bilateral adnexa are unremarkable. Other: No abdominal wall hernia or abnormality. No abdominopelvic ascites. Musculoskeletal: No acute or  significant osseous findings. IMPRESSION: 1. Mild airspace opacities are noted in the right lung base concerning for possible pneumonia. 2. Moderate size sliding-type hiatal hernia. 3. Status post gastric bypass. 4. No other abnormality seen in the abdomen or pelvis. 5. Aortic atherosclerosis. Aortic Atherosclerosis (ICD10-I70.0). Electronically Signed   By: Lupita Raider M.D.   On: 09/15/2020 13:43   DG Chest Portable 1 View  Result Date: 09/15/2020 CLINICAL DATA:  Sepsis. EXAM: PORTABLE CHEST 1 VIEW COMPARISON:  None. FINDINGS: The heart size and mediastinal contours are within normal limits. Both lungs are clear. The visualized skeletal structures are unremarkable. IMPRESSION: No active disease. Electronically Signed   By: Lupita Raider M.D.   On: 09/15/2020 14:17     I reviewed all nursing notes, pharmacy notes, vitals, pertinent old records.  Assessment/Plan  Active Problems:   Sepsis (HCC)    # Sepsis could be secondary to viral gastroenteritis versus pneumonia Patient presented with abdominal pain, nausea vomiting and diarrhea and cough CT scan abdomen did not show any significant findings in the abdomen, hiatal hernia, status post gastric bypass and right lower lobe infiltrates Chest x-ray negative WBC count elevated 14.8, lactic acid 2.2 COVID-19 negative Follow C. difficile and GI pathogen F/u Repeat lactic acid level, follow-up procalcitonin level Patient was given vancomycin and cefepime in the ED which has been continued Pharmacy consulted for vancomycin dosing and trough monitoring Follow blood culture   # History of seizure disorder Resumed home medications Topamax, zonisamide and diazepam Continue seizure precautions  # Hypothyroid, continue with Synthroid    Nutrition: Soft diet easy to digest DVT Prophylaxis: Subcutaneous Lovenox  Advance goals of care discussion: Full code   Consults: None   Family Communication: family was not present at bedside,  at the time of interview.  Opportunity was given to ask question and all questions were answered satisfactorily.  Disposition: Admitted as inpatient, med-surge unit. Likely to be discharged home , in 1-2 days .  I have discussed plan of care as described above with RN and patient/family.  Severity of Illness: The appropriate patient status for this patient is INPATIENT. Inpatient status is judged to be reasonable and necessary in order to provide the required intensity of service to ensure the patient's safety. The patient's presenting symptoms, physical exam findings, and initial radiographic and laboratory data in the context of their chronic comorbidities is felt to place them at high risk for further clinical deterioration. Furthermore, it is not anticipated that the patient will be medically stable for discharge from the hospital within 2 midnights of admission. The following factors support the patient status of inpatient.   " The patient's presenting symptoms include nausea vomiting and diarrhea. " The worrisome physical exam findings include hypotension, sepsis. " The initial radiographic and laboratory data are worrisome because of limited WBC count and lactic acid. " The chronic co-morbidities include gastroparesis s/p gastric  bypass, lupus, hypothyroid, seizures.   * I certify that at the point of admission it is my clinical judgment that the patient will require inpatient hospital care spanning beyond 2 midnights from the point of admission due to high intensity of service, high risk for further deterioration and high frequency of surveillance required.*    Author: Gillis Santa, MD [facility] 09/15/2020 4:21 PM   To reach On-call, see care teams to locate the attending and reach out to them via [email or web address]. If 7PM-7AM, please contact night-coverage If you still have difficulty reaching the attending provider, please page the [facility] (Director on Call) for [facility]  on amion for assistance.

## 2020-09-15 NOTE — Progress Notes (Signed)
PHARMACIST - PHYSICIAN COMMUNICATION  CONCERNING:  Enoxaparin (Lovenox) for DVT Prophylaxis    RECOMMENDATION: Patient was prescribed enoxaprin 40mg  q24 hours for VTE prophylaxis.   Filed Weights   09/15/20 1006  Weight: 99.8 kg (220 lb)    Body mass index is 32.49 kg/m.  Estimated Creatinine Clearance: 86.5 mL/min (by C-G formula based on SCr of 0.88 mg/dL).   Based on [facility] policy patient is candidate for enoxaparin 0.5mg /kg TBW SQ every 24 hours based on BMI being >30.  DESCRIPTION: Pharmacy has adjusted enoxaparin dose per [facility] policy.  Patient is now receiving enoxaparin 50 mg every 24 hours    [facility], PharmD, BCPS Clinical Pharmacist 09/15/2020 4:02 PM

## 2020-09-15 NOTE — Progress Notes (Signed)
Pharmacy Antibiotic Note  Chelsea Pena is a 59 y.o. female admitted on 09/15/2020 with N/V and Severe sepsis.   Pharmacy has been consulted for vancomycin dosing. Patient is also receiving cefepime.   Plan: Vancomycin 2000mg  loading dose x 1, followed by Vancomycin 1250mg  IV every 12 hours. Pharmacy will monitor renal function and adjust dose as needed. Plan for trough level after 4-5 doses if vancomycin is continued.   Cefepime dose has been adjusted to 2g IV every 8 hours based on CrCl >11ml/min and sepsis indication.   Height: 5\' 9"  (175.3 cm) Weight: 99.8 kg (220 lb) IBW/kg (Calculated) : 66.2  Temp (24hrs), Avg:99.6 F (37.6 C), Min:98.8 F (37.1 C), Max:100.4 F (38 C)  Recent Labs  Lab 09/15/20 1000 09/15/20 1246  WBC 14.8*  --   CREATININE 0.88  --   LATICACIDVEN  --  2.2*    Estimated Creatinine Clearance: 86.5 mL/min (by C-G formula based on SCr of 0.88 mg/dL).    Allergies  Allergen Reactions  . Reglan [Metoclopramide]     Antimicrobials this admission: 11/11 Vancomycin >>  11/11 Cefepime  >>  Microbiology results: 11/11 BCx: pending  11/11 UCx: pending   Thank you for allowing pharmacy to be a part of this patient's care.  13/11, PharmD, BCPS Clinical Pharmacist 09/15/2020 2:49 PM

## 2020-09-15 NOTE — ED Notes (Signed)
Alease Frame (daughter) [phone]

## 2020-09-15 NOTE — ED Notes (Signed)
Medication Reconciliation Report  For Home History Technicians  HIGHLIGHTS:  1. The patient WAS personally interviewed 2. If not, what was the main source used: NOT APPLICABLE 3. Does the patient appear to take any anti-coagulation agents (e.g. warfarin, Eliquis or Xarelto): NO 4. Does the patient appear to take any anti-convulsant agents (e.g. divalproex, levetiracetam or phenytoin): YES 5. Does the patient appear to use any insulin products (e.g. Lantus, Novolin or Humalog): NO 6. Does the patient appear to take any "beta-blockers" (e.g. metoprolol, carvedilol or bisoprolol: NO  BARRIERS:  1. Were there any barriers that prevented or complicated the medication reconciliation process: NO 2. If yes, what was the primary barrier encountered: None 3. Does the patient appear compliant with prescribed medications: YES 4. Does the patient express any barriers with compliance: NO 5. What is the primary barrier the patient reports: None   NOTES:[Include any concerns, remarks or complaints the patient expresses regarding medication therapy. Any observations or other information that might be useful to the treatment team can also be included. Immediate needs or concerns should be referred to the RN or appropriate member of the treatment team.]  The patient reports taking TOPIRAMATE and ZONISAMIDE as reflected in med reconciliation tab. This differs somewhat from the directions reported from pharmacy records (100mg  BID and 50mg  QD, respectively)        , CPhT [place] [facility] [address] [place], [address] [place]  ** The above is intended solely for informational and/or communicative purposes. It should in no way be considered an endorsement of any specific treatment, therapy or action. **

## 2020-09-15 NOTE — Plan of Care (Signed)
  Problem: Education: Goal: Knowledge of General Education information will improve Description: Including pain rating scale, medication(s)/side effects and non-pharmacologic comfort measures Outcome: Progressing   Problem: Activity: Goal: Ability to tolerate increased activity will improve Outcome: Progressing   Problem: Clinical Measurements: Goal: Ability to maintain a body temperature in the normal range will improve Outcome: Progressing   Problem: Respiratory: Goal: Ability to maintain adequate ventilation will improve Outcome: Progressing Goal: Ability to maintain a clear airway will improve Outcome: Progressing   

## 2020-09-15 NOTE — ED Notes (Signed)
MD aware of hypotension

## 2020-09-16 ENCOUNTER — Encounter: Payer: Self-pay | Admitting: Student

## 2020-09-16 DIAGNOSIS — J189 Pneumonia, unspecified organism: Secondary | ICD-10-CM

## 2020-09-16 DIAGNOSIS — K529 Noninfective gastroenteritis and colitis, unspecified: Secondary | ICD-10-CM

## 2020-09-16 DIAGNOSIS — G40909 Epilepsy, unspecified, not intractable, without status epilepticus: Secondary | ICD-10-CM | POA: Diagnosis not present

## 2020-09-16 DIAGNOSIS — R569 Unspecified convulsions: Secondary | ICD-10-CM

## 2020-09-16 DIAGNOSIS — A419 Sepsis, unspecified organism: Secondary | ICD-10-CM | POA: Diagnosis not present

## 2020-09-16 LAB — COMPREHENSIVE METABOLIC PANEL
ALT: 18 U/L (ref 0–44)
AST: 19 U/L (ref 15–41)
Albumin: 3.3 g/dL — ABNORMAL LOW (ref 3.5–5.0)
Alkaline Phosphatase: 106 U/L (ref 38–126)
Anion gap: 5 (ref 5–15)
BUN: 13 mg/dL (ref 6–20)
CO2: 23 mmol/L (ref 22–32)
Calcium: 8.1 mg/dL — ABNORMAL LOW (ref 8.9–10.3)
Chloride: 109 mmol/L (ref 98–111)
Creatinine, Ser: 0.86 mg/dL (ref 0.44–1.00)
GFR, Estimated: >60 mL/min (ref >60)
Glucose, Bld: 99 mg/dL (ref 70–99)
Potassium: 3.7 mmol/L (ref 3.5–5.1)
Sodium: 137 mmol/L (ref 135–145)
Total Bilirubin: 0.6 mg/dL (ref 0.3–1.2)
Total Protein: 6.2 g/dL — ABNORMAL LOW (ref 6.5–8.1)

## 2020-09-16 LAB — CBC
HCT: 31 % — ABNORMAL LOW (ref 36.0–46.0)
Hemoglobin: 9.6 g/dL — ABNORMAL LOW (ref 12.0–15.0)
MCH: 26.8 pg (ref 26.0–34.0)
MCHC: 31 g/dL (ref 30.0–36.0)
MCV: 86.6 fL (ref 80.0–100.0)
Platelets: 282 10*3/uL (ref 150–400)
RBC: 3.58 MIL/uL — ABNORMAL LOW (ref 3.87–5.11)
RDW: 15.4 % (ref 11.5–15.5)
WBC: 16.4 10*3/uL — ABNORMAL HIGH (ref 4.0–10.5)
nRBC: 0 % (ref 0.0–0.2)

## 2020-09-16 LAB — URINE CULTURE: Culture: NO GROWTH

## 2020-09-16 LAB — PROCALCITONIN: Procalcitonin: 8.99 ng/mL

## 2020-09-16 LAB — GLUCOSE, CAPILLARY
Glucose-Capillary: 101 mg/dL — ABNORMAL HIGH (ref 70–99)
Glucose-Capillary: 100 mg/dL — ABNORMAL HIGH (ref 70–99)
Glucose-Capillary: 99 mg/dL (ref 70–99)
Glucose-Capillary: 132 mg/dL — ABNORMAL HIGH (ref 70–99)

## 2020-09-16 MED ORDER — LEVOTHYROXINE SODIUM 75 MCG PO TABS
75.0000 ug | ORAL_TABLET | Freq: Every day | ORAL | Status: DC
  Administered 2020-09-17 – 2020-09-18 (×2): 75 ug via ORAL
  Filled 2020-09-16 (×2): qty 3
  Filled 2020-09-16 (×2): qty 1

## 2020-09-16 MED ORDER — METRONIDAZOLE IN NACL 5-0.79 MG/ML-% IV SOLN: 500.0000 mg | Freq: Three times a day (TID) | INTRAVENOUS | Status: DC

## 2020-09-16 MED ORDER — METRONIDAZOLE 500 MG PO TABS
500.0000 mg | ORAL_TABLET | Freq: Three times a day (TID) | ORAL | Status: DC
  Administered 2020-09-16 – 2020-09-18 (×7): 500 mg via ORAL
  Filled 2020-09-16 (×7): qty 1

## 2020-09-16 MED ORDER — LACTATED RINGERS IV SOLN: INTRAVENOUS | Status: DC

## 2020-09-16 NOTE — Progress Notes (Signed)
Patient alert and oriented, no complaints of pain, Tylenol given for fever this am, will continue to monitor.

## 2020-09-16 NOTE — Consult Note (Signed)
Neurology Consultation Reason for Consult: Possible seizure Referring Physician: Gwenyth Allegra  CC: Possible seizure  History is obtained from: Patient  HPI: Chelsea Pena is a 59 y.o. female with a history of seizures who presents with possible breakthrough seizure.  She states that she had a history of febrile seizures, and suspects that she had seizures as a child, but her first "grand mal" seizure was in 1996.  She comes to the emergency department complaining of abdominal pain and diarrhea as well as cough.  She was found to have pneumonia.  She states that it is possible, though she is not certain that she had seizures prior to admission.  She has had not had any seizures since admission.  She currently takes 100 mg 3 times daily of Topamax as well as 100 mg daily of Zonegran and 10 mg of diazepam at night.  ROS: A 14 point ROS was performed and is negative except as noted in the HPI.   Past Medical History:  Diagnosis Date   Gastroparesis    Seizures (HCC)      History reviewed. No pertinent family history.   Social History:  reports that she has never smoked. She has never used smokeless tobacco. No history on file for alcohol use and drug use.   Exam: Current vital signs: BP (!) 103/55 (BP Location: Right Arm)    Pulse 77    Temp 98.8 F (37.1 C) (Oral)    Resp (!) 22    Ht 5\' 9"  (1.753 m)    Wt 99.8 kg    SpO2 95%    BMI 32.49 kg/m  Vital signs in last 24 hours: Temp:  [98 F (36.7 C)-101.8 F (38.8 C)] 98.8 F (37.1 C) (11/12 1044) Pulse Rate:  [70-86] 77 (11/12 1044) Resp:  [15-24] 22 (11/12 1044) BP: (82-127)/(48-65) 103/55 (11/12 1044) SpO2:  [89 %-100 %] 95 % (11/12 1044)   Physical Exam  Constitutional: Appears well-developed and well-nourished.  Psych: Affect appropriate to situation Eyes: No scleral injection HENT: No OP obstrucion MSK: no joint deformities.  Cardiovascular: Normal rate and regular rhythm.  Respiratory: Effort normal, non-labored  breathing GI: Soft.  No distension. There is no tenderness.  Skin: WDI  Neuro: Mental Status: Patient is awake, alert, interactive and appropriate Patient is able to give a clear and coherent history. No signs of aphasia or neglect Cranial Nerves: II: Visual Fields are full. Pupils are equal, round, and reactive to light.   III,IV, VI: EOMI without ptosis or diploplia.  V: Facial sensation is symmetric to temperature VII: Facial movement is symmetric.  VIII: hearing is intact to voice X: Uvula elevates symmetrically XI: Shoulder shrug is symmetric. XII: tongue is midline without atrophy or fasciculations.  Motor: Tone is normal. Bulk is normal. 5/5 strength was present in all four extremities.  Sensory: Sensation is symmetric to light touch and temperature in the arms and legs. Cerebellar: FNF and HKS are intact bilaterally    I have reviewed labs in epic and the results pertinent to this consultation are: Creatinine 0.8  I have reviewed the images obtained: CT head-relatively unremarkable  Impression: 58 year old female with a history of seizures with possible breakthrough seizures in the setting of GI illness and pneumonia.  I would not change her underlying antiepileptics.  Also I do not feel that an EEG would change management at this time and therefore I have canceled this.    Recommendations: 1) continue home antiepileptics 2) please call if the patient  has any further breakthrough seizures and we can make further recommendations at that time.   Ritta Slot, MD [facility] [phone]  If 7pm- 7am, please page neurology on call as listed in AMION.

## 2020-09-16 NOTE — Progress Notes (Signed)
PROGRESS NOTE  Chelsea Pena BZJ:[id number] DOB: 1961/07/10 DOA: 09/15/2020 PCP: System, Provider Not In  HPI/Recap of past 24 hours: HPI from Dr Briant Cedar is a 58 y.o. female with past medical history of seizure disorder, lupus, hypothyroid,  gastroparesis status post gastric bypass, as reviewed from EMR, presented [facility] ED due to complaining of abdominal pain and diarrhea which started yesterday while she was traveling from [place] and on the way she stopped and had huge bowel movement, she was feeling sick and did not eat much as she was nauseous and vomited. Pt reported she may have had a seizure episode as witnessed by her mother. Unsure if she LOC. Patient was complaining of headache, denies any fever. Patient is vaccinated against Covid 19 and she received booster dose on Monday, patient also received right hip steroid injection on Monday. In the ED, noted to be hypotensive, given IV fluid boluses, chest x-ray consistent with right lower lobe infiltrate, WBC 14.8, lactic acid 2.2.  Patient admitted for further management.    Today, patient denies any further diarrhea, nausea/vomiting, reports feeling weak overall, denies any chest pain, shortness of breath, abdominal pain.  Noted to have a temp spike of 101.8 this morning.  No seizure episode noted    Assessment/Plan: Active Problems:   Sepsis ([place])   Sepsis likely 2/2 possible pneumonia Vs gastroenteritis Met sepsis criteria, tachycardia, tachypneic, leukocytosis Last temp spike 101.65F on 09/16/20, with leukocytosis Lactic acid 2, procalcitonin 6.05--> 8.99, will trend both BC x2 pending, UA unremarkable, UC pending CT abdomen unremarkable for any intra-abdominal issues, but did show right lower lobe infiltrates (?? Aspiration PNA during seizure) Chest x-ray negative COVID-19, flu negative Gastrointestinal panel pending (diarrhea resolved) Continue vancomycin, cefepime, metronidazole, plan to  de-escalate  History of seizure disorder Possible breakthrough seizure PTA Most likely had a seizure disorder, which may have been precipitated by gastroenteritis Resume home medications Topamax, zonisamide and diazepam Neurology consulted, no further recs Continue seizure precautions  Hypothyroidism Continue Synthroid  Possible normocytic anemia Unknown baseline No signs of bleeding Anemia panel Daily CBC  Obesity Lifestyle modification advised         Malnutrition Type:      Malnutrition Characteristics:      Nutrition Interventions:       Estimated body mass index is 32.49 kg/m as calculated from the following:   Height as of this encounter: $RemoveBeforeD'5\' 9"'[id number]$  (1.753 m).   Weight as of this encounter: 99.8 kg.     Code Status: Full  Family Communication: Discussed with patient  Disposition Plan: Status is: Inpatient  Remains inpatient appropriate because:Inpatient level of care appropriate due to severity of illness   Dispo: The patient is from: Home              Anticipated d/c is to: Home              Anticipated d/c date is: 1 day              Patient currently is not medically stable to d/c.    Consultants:  Neurology  Procedures:  None  Antimicrobials:  Vancomycin  Cefepime  Metronidazole  DVT prophylaxis: Lovenox   Objective: Vitals:   09/16/20 0604 09/16/20 0659 09/16/20 0838 09/16/20 1044  BP: 127/65  107/62 (!) 103/55  Pulse: 86  70 77  Resp: 18  20 (!) 22  Temp: (!) 101.8 F (38.8 C) 99 F (37.2 C) 98.4 F (36.9 C) 98.8 F (37.1 C)  TempSrc: Oral Oral Oral Oral  SpO2: 96%  97% 95%  Weight:      Height:        Intake/Output Summary (Last 24 hours) at 09/16/2020 1411 Last data filed at 09/16/2020 1000 Gross per 24 hour  Intake 775.66 ml  Output 3100 ml  Net -2324.34 ml   Filed Weights   09/15/20 1006  Weight: 99.8 kg    Exam:  General: NAD  Cardiovascular: S1, S2 present  Respiratory: CTAB  Abdomen:  Soft, nontender, nondistended, bowel sounds present  Musculoskeletal: No bilateral pedal edema noted  Skin: Normal  Psychiatry: Normal mood    Data Reviewed: CBC: Recent Labs  Lab 09/15/20 1000 09/16/20 0448  WBC 14.8* 16.4*  NEUTROABS 13.1*  --   HGB 10.3* 9.6*  HCT 32.6* 31.0*  MCV 85.1 86.6  PLT 341 814   Basic Metabolic Panel: Recent Labs  Lab 09/15/20 1000 09/15/20 1430 09/16/20 0448  NA 142  --  137  K 3.8  --  3.7  CL 109  --  109  CO2 23  --  23  GLUCOSE 119*  --  99  BUN 14  --  13  CREATININE 0.88  --  0.86  CALCIUM 8.3*  --  8.1*  MG  --  1.6*  --   PHOS  --  3.5  --    GFR: Estimated Creatinine Clearance: 88.5 mL/min (by C-G formula based on SCr of 0.86 mg/dL). Liver Function Tests: Recent Labs  Lab 09/15/20 1000 09/16/20 0448  AST 21 19  ALT 20 18  ALKPHOS 119 106  BILITOT 0.6 0.6  PROT 6.7 6.2*  ALBUMIN 3.6 3.3*   Recent Labs  Lab 09/15/20 1000  LIPASE 29   No results for input(s): AMMONIA in the last 168 hours. Coagulation Profile: No results for input(s): INR, PROTIME in the last 168 hours. Cardiac Enzymes: No results for input(s): CKTOTAL, CKMB, CKMBINDEX, TROPONINI in the last 168 hours. BNP (last 3 results) No results for input(s): PROBNP in the last 8760 hours. HbA1C: No results for input(s): HGBA1C in the last 72 hours. CBG: Recent Labs  Lab 09/15/20 0948 09/16/20 0834 09/16/20 1040  GLUCAP 79 101* 100*   Lipid Profile: No results for input(s): CHOL, HDL, LDLCALC, TRIG, CHOLHDL, LDLDIRECT in the last 72 hours. Thyroid Function Tests: No results for input(s): TSH, T4TOTAL, FREET4, T3FREE, THYROIDAB in the last 72 hours. Anemia Panel: No results for input(s): VITAMINB12, FOLATE, FERRITIN, TIBC, IRON, RETICCTPCT in the last 72 hours. Urine analysis:    Component Value Date/Time   COLORURINE YELLOW (A) 09/15/2020 1246   APPEARANCEUR CLEAR (A) 09/15/2020 1246   LABSPEC 1.023 09/15/2020 1246   PHURINE 7.0  09/15/2020 [place] 09/15/2020 1246   HGBUR NEGATIVE 09/15/2020 [place] 09/15/2020 [place] 09/15/2020 1246   PROTEINUR NEGATIVE 09/15/2020 1246   NITRITE NEGATIVE 09/15/2020 1246   LEUKOCYTESUR NEGATIVE 09/15/2020 1246   Sepsis Labs: $RemoveBefo'@[email or web address]'[id number]$ (procalcitonin:4,lacticidven:4)  ) Recent Results (from the past 240 hour(s))  Blood culture (single)     Status: None (Preliminary result)   Collection Time: 09/15/20 10:00 AM   Specimen: BLOOD  Result Value Ref Range Status   Specimen Description BLOOD LEFT AC  Final   Special Requests   Final    BOTTLES DRAWN AEROBIC AND ANAEROBIC Blood Culture adequate volume   Culture   Final    NO GROWTH < 24 HOURS Performed at [facility], [address] [place], [place] [address]    Report Status PENDING  Incomplete  Respiratory Panel by RT PCR (Flu A&B, Covid) - Nasopharyngeal Swab     Status: None   Collection Time: 09/15/20 10:05 AM   Specimen: Nasopharyngeal Swab  Result Value Ref Range Status   SARS Coronavirus 2 by RT PCR NEGATIVE NEGATIVE Final    Comment: (NOTE) SARS-CoV-2 target nucleic acids are NOT DETECTED.  The SARS-CoV-2 RNA is generally detectable in upper respiratoy specimens during the acute phase of infection. The lowest concentration of SARS-CoV-2 viral copies this assay can detect is 131 copies/mL. A negative result does not preclude SARS-Cov-2 infection and should not be used as the sole basis for treatment or other patient management decisions. A negative result may occur with  improper specimen collection/handling, submission of specimen other than nasopharyngeal swab, presence of viral mutation(s) within the areas targeted by this assay, and inadequate number of viral copies (<131 copies/mL). A negative result must be combined with clinical observations, patient history, and epidemiological information. The expected result is Negative.  Fact Sheet for  Patients:  https://www.moore.com/  Fact Sheet for Healthcare Providers:  https://www.young.biz/  This test is no t yet approved or cleared by the [place] FDA and  has been authorized for detection and/or diagnosis of SARS-CoV-2 by FDA under an Emergency Use Authorization (EUA). This EUA will remain  in effect (meaning this test can be used) for the duration of the COVID-19 declaration under Section 564(b)(1) of the Act, 21 U.S.C. section 360bbb-3(b)(1), unless the authorization is terminated or revoked sooner.     Influenza A by PCR NEGATIVE NEGATIVE Final   Influenza B by PCR NEGATIVE NEGATIVE Final    Comment: (NOTE) The Xpert Xpress SARS-CoV-2/FLU/RSV assay is intended as an aid in  the diagnosis of influenza from Nasopharyngeal swab specimens and  should not be used as a sole basis for treatment. Nasal washings and  aspirates are unacceptable for Xpert Xpress SARS-CoV-2/FLU/RSV  testing.  Fact Sheet for Patients: https://www.moore.com/  Fact Sheet for Healthcare Providers: https://www.young.biz/  This test is not yet approved or cleared by the [place] FDA and  has been authorized for detection and/or diagnosis of SARS-CoV-2 by  FDA under an Emergency Use Authorization (EUA). This EUA will remain  in effect (meaning this test can be used) for the duration of the  Covid-19 declaration under Section 564(b)(1) of the Act, 21  U.S.C. section 360bbb-3(b)(1), unless the authorization is  terminated or revoked. Performed at [facility], [address] [place], [place] [address]   Blood culture (single)     Status: None (Preliminary result)   Collection Time: 09/15/20 12:46 PM   Specimen: BLOOD  Result Value Ref Range Status   Specimen Description BLOOD LEFT ANTECUBITAL  Final   Special Requests   Final    BOTTLES DRAWN AEROBIC AND ANAEROBIC Blood Culture adequate volume    Culture   Final    NO GROWTH < 24 HOURS Performed at [facility], [address] [place], [place] [address]    Report Status PENDING  Incomplete      Studies: No results found.  Scheduled Meds: . diazepam  10 mg Oral QHS  . enoxaparin (LOVENOX) injection  0.5 mg/kg Subcutaneous Q24H  . [START ON 09/17/2020] levothyroxine  75 mcg Oral Q0600  . magnesium oxide  400 mg Oral BID  . metroNIDAZOLE  500 mg Oral Q8H  . pramipexole  0.75 mg Oral QHS  .  topiramate  100 mg Oral TID  . zonisamide  100 mg Oral Daily    Continuous Infusions: . ceFEPime (MAXIPIME) IV 2 g (09/16/20 1358)  . lactated ringers 100 mL/hr at 09/16/20 0805  . vancomycin 1,250 mg (09/16/20 0606)     LOS: 1 day     Alma Friendly, MD [facility]  If 7PM-7AM, please contact night-coverage [email or web address] 09/16/2020, 2:11 PM

## 2020-09-16 NOTE — Plan of Care (Signed)
  Problem: Activity: Goal: Ability to tolerate increased activity will improve Outcome: Progressing   Problem: Education: Goal: Knowledge of General Education information will improve Description: Including pain rating scale, medication(s)/side effects and non-pharmacologic comfort measures Outcome: Progressing   Problem: Clinical Measurements: Goal: Ability to maintain a body temperature in the normal range will improve Outcome: Progressing   Problem: Respiratory: Goal: Ability to maintain adequate ventilation will improve Outcome: Progressing Goal: Ability to maintain a clear airway will improve Outcome: Progressing   Problem: Fluid Volume: Goal: Hemodynamic stability will improve Outcome: Progressing   Problem: Clinical Measurements: Goal: Diagnostic test results will improve Outcome: Progressing Goal: Signs and symptoms of infection will decrease Outcome: Progressing   Problem: Respiratory: Goal: Ability to maintain adequate ventilation will improve Outcome: Progressing   Problem: Fluid Volume: Goal: Hemodynamic stability will improve Outcome: Progressing   Problem: Clinical Measurements: Goal: Diagnostic test results will improve Outcome: Progressing Goal: Signs and symptoms of infection will decrease Outcome: Progressing   Problem: Respiratory: Goal: Ability to maintain adequate ventilation will improve Outcome: Progressing

## 2020-09-17 DIAGNOSIS — K529 Noninfective gastroenteritis and colitis, unspecified: Secondary | ICD-10-CM | POA: Diagnosis not present

## 2020-09-17 DIAGNOSIS — A419 Sepsis, unspecified organism: Secondary | ICD-10-CM | POA: Diagnosis not present

## 2020-09-17 DIAGNOSIS — R569 Unspecified convulsions: Secondary | ICD-10-CM | POA: Diagnosis not present

## 2020-09-17 DIAGNOSIS — J189 Pneumonia, unspecified organism: Secondary | ICD-10-CM | POA: Diagnosis not present

## 2020-09-17 LAB — CBC WITH DIFFERENTIAL/PLATELET
Abs Immature Granulocytes: 0.03 10*3/uL (ref 0.00–0.07)
Basophils Absolute: 0.1 10*3/uL (ref 0.0–0.1)
Basophils Relative: 1 %
Eosinophils Absolute: 0.2 10*3/uL (ref 0.0–0.5)
Eosinophils Relative: 2 %
HCT: 28.7 % — ABNORMAL LOW (ref 36.0–46.0)
Hemoglobin: 8.7 g/dL — ABNORMAL LOW (ref 12.0–15.0)
Immature Granulocytes: 0 %
Lymphocytes Relative: 19 %
Lymphs Abs: 1.5 10*3/uL (ref 0.7–4.0)
MCH: 26.3 pg (ref 26.0–34.0)
MCHC: 30.3 g/dL (ref 30.0–36.0)
MCV: 86.7 fL (ref 80.0–100.0)
Monocytes Absolute: 0.3 10*3/uL (ref 0.1–1.0)
Monocytes Relative: 4 %
Neutro Abs: 6 10*3/uL (ref 1.7–7.7)
Neutrophils Relative %: 74 %
Platelets: 232 10*3/uL (ref 150–400)
RBC: 3.31 MIL/uL — ABNORMAL LOW (ref 3.87–5.11)
RDW: 15.6 % — ABNORMAL HIGH (ref 11.5–15.5)
WBC: 8.1 10*3/uL (ref 4.0–10.5)
nRBC: 0 % (ref 0.0–0.2)

## 2020-09-17 LAB — IRON AND TIBC
Iron: 23 ug/dL — ABNORMAL LOW (ref 28–170)
Saturation Ratios: 6 % — ABNORMAL LOW (ref 10.4–31.8)
TIBC: 364 ug/dL (ref 250–450)
UIBC: 341 ug/dL

## 2020-09-17 LAB — BASIC METABOLIC PANEL
Anion gap: 5 (ref 5–15)
BUN: 12 mg/dL (ref 6–20)
CO2: 22 mmol/L (ref 22–32)
Calcium: 8.2 mg/dL — ABNORMAL LOW (ref 8.9–10.3)
Chloride: 113 mmol/L — ABNORMAL HIGH (ref 98–111)
Creatinine, Ser: 0.88 mg/dL (ref 0.44–1.00)
GFR, Estimated: >60 mL/min (ref >60)
Glucose, Bld: 99 mg/dL (ref 70–99)
Potassium: 3.7 mmol/L (ref 3.5–5.1)
Sodium: 140 mmol/L (ref 135–145)

## 2020-09-17 LAB — VITAMIN B12: Vitamin B-12: 308 pg/mL (ref 180–914)

## 2020-09-17 LAB — FOLATE: Folate: 9.6 ng/mL (ref >5.9)

## 2020-09-17 LAB — FERRITIN: Ferritin: 26 ng/mL (ref 11–307)

## 2020-09-17 LAB — LACTIC ACID, PLASMA: Lactic Acid, Venous: 0.8 mmol/L (ref 0.5–1.9)

## 2020-09-17 LAB — PROCALCITONIN: Procalcitonin: 5.3 ng/mL

## 2020-09-17 LAB — MAGNESIUM: Magnesium: 2.1 mg/dL (ref 1.7–2.4)

## 2020-09-17 MED ORDER — FERROUS SULFATE 325 (65 FE) MG PO TABS
325.0000 mg | ORAL_TABLET | Freq: Every day | ORAL | Status: DC
  Administered 2020-09-18: 325 mg via ORAL
  Filled 2020-09-17: qty 1

## 2020-09-17 MED ORDER — SODIUM CHLORIDE 0.9 % IV SOLN
510.0000 mg | Freq: Once | INTRAVENOUS | Status: AC
  Administered 2020-09-17: 16:00:00 510 mg via INTRAVENOUS
  Filled 2020-09-17: qty 17

## 2020-09-17 NOTE — Progress Notes (Signed)
PROGRESS NOTE  Chelsea Pena TOI:[id number] DOB: 11/05/1961 DOA: 09/15/2020 PCP: System, Provider Not In  HPI/Recap of past 24 hours: HPI from Dr Briant Cedar is a 59 y.o. female with past medical history of seizure disorder, lupus, hypothyroid,  gastroparesis status post gastric bypass, as reviewed from EMR, presented [facility] ED due to complaining of abdominal pain and diarrhea which started yesterday while she was traveling from [place] and on the way she stopped and had huge bowel movement, she was feeling sick and did not eat much as she was nauseous and vomited. Pt reported she may have had a seizure episode as witnessed by her mother. Unsure if she LOC. Patient was complaining of headache, denies any fever. Patient is vaccinated against Covid 19 and she received booster dose on Monday, patient also received right hip steroid injection on Monday. In the ED, noted to be hypotensive, given IV fluid boluses, chest x-ray consistent with right lower lobe infiltrate, WBC 14.8, lactic acid 2.2.  Patient admitted for further management.     Today, patient continues to still feel weak overall, denies any further diarrhea, nausea/vomiting, fever/chills, chest pain, abdominal pain.    Assessment/Plan: Active Problems:   Sepsis ([place])   Sepsis likely 2/2 possible ?aspiration pneumonia Vs gastroenteritis Met sepsis criteria, tachycardia, tachypneic, leukocytosis Last temp spike 101.43F on 09/16/20, with leukocytosis Lactic acid 2, procalcitonin 6.05--> 8.99-->5.30 BC x2 NGTD, UA unremarkable, UC no growth CT abdomen unremarkable for any intra-abdominal issues, but did show right lower lobe infiltrates (?? Aspiration PNA during seizure) Chest x-ray negative COVID-19, flu negative Gastrointestinal panel never collected (diarrhea resolved) Continue cefepime, metronidazole  History of seizure disorder Possible breakthrough seizure PTA Most likely had a seizure disorder, which may  have been precipitated by gastroenteritis Resume home medications Topamax, zonisamide and diazepam Neurology consulted, no further recs Continue seizure precautions  Hypothyroidism Continue Synthroid  Possible normocytic anemia/iron def Unknown baseline, although worsened by hemodilaution No signs of bleeding Anemia panel showed iron 23, sat 6 Will give 1 dose of feraheme and d/c on PO iron supplementation  Daily CBC  Obesity Lifestyle modification advised         Malnutrition Type:      Malnutrition Characteristics:      Nutrition Interventions:       Estimated body mass index is 32.49 kg/m as calculated from the following:   Height as of this encounter: $RemoveBeforeD'5\' 9"'[id number]$  (1.753 m).   Weight as of this encounter: 99.8 kg.     Code Status: Full  Family Communication: Discussed with patient  Disposition Plan: Status is: Inpatient  Remains inpatient appropriate because:Inpatient level of care appropriate due to severity of illness   Dispo: The patient is from: Home              Anticipated d/c is to: Home              Anticipated d/c date is: 1 day              Patient currently is not medically stable to d/c.    Consultants:  Neurology  Procedures:  None  Antimicrobials:  Cefepime  Metronidazole  DVT prophylaxis: Lovenox   Objective: Vitals:   09/17/20 0021 09/17/20 0500 09/17/20 0753 09/17/20 1143  BP: 112/65 111/64 124/72 113/64  Pulse: 65 63 66 68  Resp: $Remo'18 20 18 'RGUpq$ (!) 21  Temp: 98.3 F (36.8 C) 98.1 F (36.7 C) 98 F (36.7 C) 98.5 F (36.9 C)  TempSrc: Oral  Oral Oral Oral  SpO2: 99% 96% 99% 97%  Weight:      Height:        Intake/Output Summary (Last 24 hours) at 09/17/2020 1445 Last data filed at 09/17/2020 1033 Gross per 24 hour  Intake 1616.2 ml  Output --  Net 1616.2 ml   Filed Weights   09/15/20 1006  Weight: 99.8 kg    Exam:  General: NAD  Cardiovascular: S1, S2 present  Respiratory: CTAB  Abdomen: Soft,  nontender, nondistended, bowel sounds present  Musculoskeletal: No bilateral pedal edema noted  Skin: Normal  Psychiatry: Normal mood    Data Reviewed: CBC: Recent Labs  Lab 09/15/20 1000 09/16/20 0448 09/17/20 0514  WBC 14.8* 16.4* 8.1  NEUTROABS 13.1*  --  6.0  HGB 10.3* 9.6* 8.7*  HCT 32.6* 31.0* 28.7*  MCV 85.1 86.6 86.7  PLT 341 282 836   Basic Metabolic Panel: Recent Labs  Lab 09/15/20 1000 09/15/20 1430 09/16/20 0448 09/17/20 0514 09/17/20 0523  NA 142  --  137 140  --   K 3.8  --  3.7 3.7  --   CL 109  --  109 113*  --   CO2 23  --  23 22  --   GLUCOSE 119*  --  99 99  --   BUN 14  --  13 12  --   CREATININE 0.88  --  0.86 0.88  --   CALCIUM 8.3*  --  8.1* 8.2*  --   MG  --  1.6*  --   --  2.1  PHOS  --  3.5  --   --   --    GFR: Estimated Creatinine Clearance: 86.5 mL/min (by C-G formula based on SCr of 0.88 mg/dL). Liver Function Tests: Recent Labs  Lab 09/15/20 1000 09/16/20 0448  AST 21 19  ALT 20 18  ALKPHOS 119 106  BILITOT 0.6 0.6  PROT 6.7 6.2*  ALBUMIN 3.6 3.3*   Recent Labs  Lab 09/15/20 1000  LIPASE 29   No results for input(s): AMMONIA in the last 168 hours. Coagulation Profile: No results for input(s): INR, PROTIME in the last 168 hours. Cardiac Enzymes: No results for input(s): CKTOTAL, CKMB, CKMBINDEX, TROPONINI in the last 168 hours. BNP (last 3 results) No results for input(s): PROBNP in the last 8760 hours. HbA1C: No results for input(s): HGBA1C in the last 72 hours. CBG: Recent Labs  Lab 09/15/20 0948 09/16/20 0834 09/16/20 1040 09/16/20 1533 09/16/20 1820  GLUCAP 79 101* 100* 99 132*   Lipid Profile: No results for input(s): CHOL, HDL, LDLCALC, TRIG, CHOLHDL, LDLDIRECT in the last 72 hours. Thyroid Function Tests: No results for input(s): TSH, T4TOTAL, FREET4, T3FREE, THYROIDAB in the last 72 hours. Anemia Panel: Recent Labs    09/17/20 0514  VITAMINB12 308  FOLATE 9.6  FERRITIN 26  TIBC 364  IRON  23*   Urine analysis:    Component Value Date/Time   COLORURINE YELLOW (A) 09/15/2020 1246   APPEARANCEUR CLEAR (A) 09/15/2020 1246   LABSPEC 1.023 09/15/2020 1246   PHURINE 7.0 09/15/2020 1246   GLUCOSEU NEGATIVE 09/15/2020 1246   HGBUR NEGATIVE 09/15/2020 1246   [place] 09/15/2020 1246   [place] 09/15/2020 1246   PROTEINUR NEGATIVE 09/15/2020 1246   NITRITE NEGATIVE 09/15/2020 1246   LEUKOCYTESUR NEGATIVE 09/15/2020 1246   Sepsis Labs: $RemoveBefo'@[email or web address]'[id number]$ (procalcitonin:4,lacticidven:4)  ) Recent Results (from the past 240 hour(s))  Blood culture (single)     Status: None (Preliminary  result)   Collection Time: 09/15/20 10:00 AM   Specimen: BLOOD  Result Value Ref Range Status   Specimen Description BLOOD LEFT AC  Final   Special Requests   Final    BOTTLES DRAWN AEROBIC AND ANAEROBIC Blood Culture adequate volume   Culture   Final    NO GROWTH 2 DAYS Performed at [facility], [address] [place], [place] [address]    Report Status PENDING  Incomplete  Respiratory Panel by RT PCR (Flu A&B, Covid) - Nasopharyngeal Swab     Status: None   Collection Time: 09/15/20 10:05 AM   Specimen: Nasopharyngeal Swab  Result Value Ref Range Status   SARS Coronavirus 2 by RT PCR NEGATIVE NEGATIVE Final    Comment: (NOTE) SARS-CoV-2 target nucleic acids are NOT DETECTED.  The SARS-CoV-2 RNA is generally detectable in upper respiratoy specimens during the acute phase of infection. The lowest concentration of SARS-CoV-2 viral copies this assay can detect is 131 copies/mL. A negative result does not preclude SARS-Cov-2 infection and should not be used as the sole basis for treatment or other patient management decisions. A negative result may occur with  improper specimen collection/handling, submission of specimen other than nasopharyngeal swab, presence of viral mutation(s) within the areas targeted by this assay, and inadequate number of viral  copies (<131 copies/mL). A negative result must be combined with clinical observations, patient history, and epidemiological information. The expected result is Negative.  Fact Sheet for Patients:  PinkCheek.be  Fact Sheet for Healthcare Providers:  GravelBags.it  This test is no t yet approved or cleared by the [place] FDA and  has been authorized for detection and/or diagnosis of SARS-CoV-2 by FDA under an Emergency Use Authorization (EUA). This EUA will remain  in effect (meaning this test can be used) for the duration of the COVID-19 declaration under Section 564(b)(1) of the Act, 21 U.S.C. section 360bbb-3(b)(1), unless the authorization is terminated or revoked sooner.     Influenza A by PCR NEGATIVE NEGATIVE Final   Influenza B by PCR NEGATIVE NEGATIVE Final    Comment: (NOTE) The Xpert Xpress SARS-CoV-2/FLU/RSV assay is intended as an aid in  the diagnosis of influenza from Nasopharyngeal swab specimens and  should not be used as a sole basis for treatment. Nasal washings and  aspirates are unacceptable for Xpert Xpress SARS-CoV-2/FLU/RSV  testing.  Fact Sheet for Patients: PinkCheek.be  Fact Sheet for Healthcare Providers: GravelBags.it  This test is not yet approved or cleared by the [place] FDA and  has been authorized for detection and/or diagnosis of SARS-CoV-2 by  FDA under an Emergency Use Authorization (EUA). This EUA will remain  in effect (meaning this test can be used) for the duration of the  Covid-19 declaration under Section 564(b)(1) of the Act, 21  U.S.C. section 360bbb-3(b)(1), unless the authorization is  terminated or revoked. Performed at [facility], [address] [place], [place] [address]   Urine Culture     Status: None   Collection Time: 09/15/20 12:46 PM   Specimen: Urine, Random  Result Value Ref  Range Status   Specimen Description   Final    URINE, RANDOM Performed at [facility], [address] [place], [place] [address]    Special Requests   Final    NONE Performed at [facility], [address] [place], [place] [address]    Culture   Final    NO GROWTH Performed at [facility],  [address], [place], [place] [address]    Report Status 09/16/2020 FINAL  Final  Blood culture (single)     Status: None (Preliminary result)   Collection Time: 09/15/20 12:46 PM   Specimen: BLOOD  Result Value Ref Range Status   Specimen Description BLOOD LEFT ANTECUBITAL  Final   Special Requests   Final    BOTTLES DRAWN AEROBIC AND ANAEROBIC Blood Culture adequate volume   Culture   Final    NO GROWTH 2 DAYS Performed at [facility], [address], [place], [place] [address]    Report Status PENDING  Incomplete      Studies: No results found.  Scheduled Meds: . diazepam  10 mg Oral QHS  . enoxaparin (LOVENOX) injection  0.5 mg/kg Subcutaneous Q24H  . levothyroxine  75 mcg Oral Q0600  . magnesium oxide  400 mg Oral BID  . metroNIDAZOLE  500 mg Oral Q8H  . pramipexole  0.75 mg Oral QHS  . topiramate  100 mg Oral TID  . zonisamide  100 mg Oral Daily    Continuous Infusions: . ceFEPime (MAXIPIME) IV 2 g (09/17/20 1321)  . lactated ringers 100 mL/hr at 09/17/20 0558     LOS: 2 days     Alma Friendly, MD [facility]  If 7PM-7AM, please contact night-coverage [email or web address] 09/17/2020, 2:45 PM

## 2020-09-17 NOTE — Plan of Care (Signed)
  Problem: Education: Goal: Knowledge of General Education information will improve Description: Including pain rating scale, medication(s)/side effects and non-pharmacologic comfort measures Outcome: Progressing   Problem: Activity: Goal: Ability to tolerate increased activity will improve Outcome: Progressing   Problem: Clinical Measurements: Goal: Ability to maintain a body temperature in the normal range will improve Outcome: Progressing   Problem: Respiratory: Goal: Ability to maintain adequate ventilation will improve Outcome: Progressing   Problem: Respiratory: Goal: Ability to maintain a clear airway will improve Outcome: Progressing   Problem: Fluid Volume: Goal: Hemodynamic stability will improve Outcome: Progressing

## 2020-09-18 DIAGNOSIS — G40909 Epilepsy, unspecified, not intractable, without status epilepticus: Secondary | ICD-10-CM | POA: Diagnosis not present

## 2020-09-18 DIAGNOSIS — J181 Lobar pneumonia, unspecified organism: Secondary | ICD-10-CM | POA: Diagnosis not present

## 2020-09-18 DIAGNOSIS — A419 Sepsis, unspecified organism: Secondary | ICD-10-CM | POA: Diagnosis not present

## 2020-09-18 LAB — CBC WITH DIFFERENTIAL/PLATELET
Abs Immature Granulocytes: 0.05 10*3/uL (ref 0.00–0.07)
Basophils Absolute: 0 10*3/uL (ref 0.0–0.1)
Basophils Relative: 1 %
Eosinophils Absolute: 0.2 10*3/uL (ref 0.0–0.5)
Eosinophils Relative: 2 %
HCT: 29.3 % — ABNORMAL LOW (ref 36.0–46.0)
Hemoglobin: 9.1 g/dL — ABNORMAL LOW (ref 12.0–15.0)
Immature Granulocytes: 1 %
Lymphocytes Relative: 24 %
Lymphs Abs: 1.7 10*3/uL (ref 0.7–4.0)
MCH: 26.7 pg (ref 26.0–34.0)
MCHC: 31.1 g/dL (ref 30.0–36.0)
MCV: 85.9 fL (ref 80.0–100.0)
Monocytes Absolute: 0.3 10*3/uL (ref 0.1–1.0)
Monocytes Relative: 5 %
Neutro Abs: 4.9 10*3/uL (ref 1.7–7.7)
Neutrophils Relative %: 67 %
Platelets: 267 10*3/uL (ref 150–400)
RBC: 3.41 MIL/uL — ABNORMAL LOW (ref 3.87–5.11)
RDW: 15.5 % (ref 11.5–15.5)
WBC: 7.2 10*3/uL (ref 4.0–10.5)
nRBC: 0 % (ref 0.0–0.2)

## 2020-09-18 MED ORDER — AMOXICILLIN-POT CLAVULANATE 875-125 MG PO TABS: 1.0000 | ORAL_TABLET | Freq: Two times a day (BID) | ORAL | 0 refills | Status: AC

## 2020-09-18 MED ORDER — FERROUS SULFATE 325 (65 FE) MG PO TABS: 325.0000 mg | ORAL_TABLET | Freq: Every day | ORAL | 0 refills | Status: AC

## 2020-09-18 NOTE — Discharge Summary (Signed)
Discharge Summary  Chelsea Pena PNT:[id number] DOB: [birth year]  PCP: System, Provider Not In  Admit date: 09/15/2020 Discharge date: 09/18/2020  Time spent: 40 mins  Recommendations for Outpatient Follow-up:  1. PCP follow-up in 1 week 2. Neurology follow-up as scheduled    Discharge Diagnoses:  Active Hospital Problems   Diagnosis Date Noted  . Sepsis ([place]) 09/15/2020    Resolved Hospital Problems  No resolved problems to display.    Discharge Condition: Stable  Diet recommendation: Heart healthy  Vitals:   09/18/20 0500 09/18/20 0900  BP: 124/74 132/62  Pulse: 60 86  Resp: 18   Temp: 97.7 F (36.5 C) 97.9 F (36.6 C)  SpO2: 96% 97%    History of present illness:  Chelsea Pena a 59 y.o.femalewith past medical history ofseizure disorder, lupus,hypothyroid,gastroparesis status post gastric bypass, as reviewed from EMR, presented [facility] ED due to complaining of abdominal pain and diarrhea which started yesterday while she was traveling from [place] and on the way she stopped and had huge bowel movement, she was feeling sick and did not eat much as she was nauseous and vomited. Pt reported she may have had a seizure episode as witnessed by her mother. Unsure if she LOC. Patient was complaining of headache, denies any fever. Patient is vaccinated against Covid 19 and she received booster dose on Monday, patient also received right hip steroid injection on Monday. In the ED, noted to be hypotensive, given IV fluid boluses, chest x-ray consistent with right lower lobe infiltrate, WBC 14.8, lactic acid 2.2.  Patient admitted for further management.    Today, patient reports feeling much better, denies any new complaints, denies any worsening shortness of breath, chest pain, abdominal pain, nausea/vomiting, fever/chills, diarrhea.  Patient stable to be discharged to follow-up with PCP, neurologist   Hospital Course:  Active Problems:   Sepsis  ([place])   Sepsis likely 2/2 possible ?aspiration pneumonia Met sepsis criteria, tachycardia, tachypneic, leukocytosis Last temp spike 101.43F on 09/16/20, now with resolved leukocytosis Lactic acid 2-->0.8, procalcitonin 6.05--> 8.99-->5.30 BC x2 NGTD, UA unremarkable, UC no growth CT abdomen unremarkable for any intra-abdominal issues, but did show right lower lobe infiltrates (?? Aspiration PNA during seizure) Chest x-ray negative COVID-19, flu negative Gastrointestinal panel never collected (diarrhea resolved) S/p cefepime, metronidazole, discharged on p.o. Augmentin to complete 7 days of antibiotics  History of seizure disorder No further witnessed seizure during entire admission Most likely had a seizure disorder, which may have been precipitated by gastroenteritis/dehydration Continue home medications Topamax, zonisamide and diazepam Neurology consulted, no further recs Outpatient follow-up with neurologist as scheduled Patient advised if feeling sick should avoid driving  Hypothyroidism Continue Synthroid  Possible normocytic anemia/iron def Unknown baseline, although worsened by hemodilaution No signs of bleeding Anemia panel showed iron 23, sat 6 S/P 1 dose of feraheme on 09/18/2020, discharged on p.o. iron supplementation Follow-up with PCP  Obesity Lifestyle modification advised         Malnutrition Type:      Malnutrition Characteristics:      Nutrition Interventions:      Estimated body mass index is 32.49 kg/m as calculated from the following:   Height as of this encounter: $RemoveBeforeD'5\' 9"'fdIJiqKZdvWVDm$  (1.753 m).   Weight as of this encounter: 99.8 kg.    Procedures:  None  Consultations:  Neurology  Discharge Exam: BP 132/62 (BP Location: Left Arm)   Pulse 86   Temp 97.9 F (36.6 C) (Oral)   Resp 18   Ht $R'5\' 9"'zI$  (  1.753 m)   Wt 99.8 kg   SpO2 97%   BMI 32.49 kg/m   General: NAD Cardiovascular: S1, S2 present Respiratory: CTA B Neurology: No  obvious focal neurologic deficits noted   Discharge Instructions You were cared for by a hospitalist during your hospital stay. If you have any questions about your discharge medications or the care you received while you were in the hospital after you are discharged, you can call the unit and asked to speak with the hospitalist on call if the hospitalist that took care of you is not available. Once you are discharged, your primary care physician will handle any further medical issues. Please note that NO REFILLS for any discharge medications will be authorized once you are discharged, as it is imperative that you return to your primary care physician (or establish a relationship with a primary care physician if you do not have one) for your aftercare needs so that they can reassess your need for medications and monitor your lab values.  Discharge Instructions    Diet - low sodium heart healthy   Complete by: As directed    Increase activity slowly   Complete by: As directed      Allergies as of 09/18/2020      Reactions   Reglan [metoclopramide] Other (See Comments)   Tardive dyskinesia        Medication List    TAKE these medications   amoxicillin-clavulanate 875-125 MG tablet Commonly known as: Augmentin Take 1 tablet by mouth 2 (two) times daily for 3 days.   cyanocobalamin 1000 MCG/ML injection Commonly known as: (VITAMIN B-12) Inject 1,000 mcg into the muscle every 14 (fourteen) days.   Diastat AcuDial 10 MG Gel Generic drug: diazepam Place 10 mg rectally daily as needed for seizure.   diazepam 5 MG tablet Commonly known as: VALIUM Take 10 mg by mouth at bedtime.   ferrous sulfate 325 (65 FE) MG tablet Take 1 tablet (325 mg total) by mouth daily with breakfast. Start taking on: September 19, 2020   levothyroxine 75 MCG tablet Commonly known as: SYNTHROID Take 75 mcg by mouth daily.   pramipexole 0.75 MG tablet Commonly known as: MIRAPEX Take 0.75 mg by mouth at  bedtime.   promethazine 25 MG tablet Commonly known as: PHENERGAN Take 25 mg by mouth 3 (three) times daily as needed for nausea/vomiting.   topiramate 100 MG tablet Commonly known as: TOPAMAX Take 100 mg by mouth 3 (three) times daily.   zonisamide 50 MG capsule Commonly known as: ZONEGRAN Take 100 mg by mouth daily.      Allergies  Allergen Reactions  . Reglan [Metoclopramide] Other (See Comments)    Tardive dyskinesia        The results of significant diagnostics from this hospitalization (including imaging, microbiology, ancillary and laboratory) are listed below for reference.    Significant Diagnostic Studies: CT Head Wo Contrast  Result Date: 09/15/2020 CLINICAL DATA:  Headache.  History of seizures EXAM: CT HEAD WITHOUT CONTRAST TECHNIQUE: Contiguous axial images were obtained from the base of the skull through the vertex without intravenous contrast. COMPARISON:  None. FINDINGS: Brain: The ventricles are normal in size and configuration. There is symmetric mild frontal atrophy bilaterally. There is no intracranial mass, hemorrhage, extra-axial fluid collection, or midline shift. Brain parenchyma appears unremarkable. No demonstrable acute infarct. Vascular: No hyperdense mass. There are foci of calcification in the carotid siphon regions. Skull: Bony calvarium appears intact. Sinuses/Orbits: There is mucosal thickening involving several  ethmoid air cells. Other visualized paranasal sinuses are clear. Visualized orbits appear symmetric bilaterally. Other: Mastoid air cells are clear. IMPRESSION: Mild frontal atrophy bilaterally.  Ventricles appear normal. Brain parenchyma appears unremarkable.  No mass or hemorrhage. There are foci of arterial vascular calcification. There is mucosal thickening in several ethmoid air cells. Electronically Signed   By: Bretta Bang III M.D.   On: 09/15/2020 13:38   CT ABDOMEN PELVIS W CONTRAST  Result Date: 09/15/2020 CLINICAL DATA:   Nausea, vomiting. EXAM: CT ABDOMEN AND PELVIS WITH CONTRAST TECHNIQUE: Multidetector CT imaging of the abdomen and pelvis was performed using the standard protocol following bolus administration of intravenous contrast. CONTRAST:  OMNIPAQUE IOHEXOL 300 MG/ML  SOLN COMPARISON:  None. FINDINGS: Lower chest: Mild airspace opacities are noted in the right lung base concerning for possible pneumonia. Moderate size sliding-type hiatal hernia is noted. Hepatobiliary: No focal liver abnormality is seen. Status post cholecystectomy. No biliary dilatation. Pancreas: Unremarkable. No pancreatic ductal dilatation or surrounding inflammatory changes. Spleen: Normal in size without focal abnormality. Adrenals/Urinary Tract: Adrenal glands are unremarkable. Kidneys are normal, without renal calculi, focal lesion, or hydronephrosis. Bladder is unremarkable. Stomach/Bowel: Status post gastric bypass. There is no evidence of bowel obstruction or inflammation. The appendix is unremarkable. Vascular/Lymphatic: Aortic atherosclerosis. No enlarged abdominal or pelvic lymph nodes. Reproductive: Uterus and bilateral adnexa are unremarkable. Other: No abdominal wall hernia or abnormality. No abdominopelvic ascites. Musculoskeletal: No acute or significant osseous findings. IMPRESSION: 1. Mild airspace opacities are noted in the right lung base concerning for possible pneumonia. 2. Moderate size sliding-type hiatal hernia. 3. Status post gastric bypass. 4. No other abnormality seen in the abdomen or pelvis. 5. Aortic atherosclerosis. Aortic Atherosclerosis (ICD10-I70.0). Electronically Signed   By: Lupita Raider M.D.   On: 09/15/2020 13:43   DG Chest Portable 1 View  Result Date: 09/15/2020 CLINICAL DATA:  Sepsis. EXAM: PORTABLE CHEST 1 VIEW COMPARISON:  None. FINDINGS: The heart size and mediastinal contours are within normal limits. Both lungs are clear. The visualized skeletal structures are unremarkable. IMPRESSION: No  active disease. Electronically Signed   By: Lupita Raider M.D.   On: 09/15/2020 14:17    Microbiology: Recent Results (from the past 240 hour(s))  Blood culture (single)     Status: None (Preliminary result)   Collection Time: 09/15/20 10:00 AM   Specimen: BLOOD  Result Value Ref Range Status   Specimen Description BLOOD LEFT [facility]  Final   Special Requests   Final    BOTTLES DRAWN AEROBIC AND ANAEROBIC Blood Culture adequate volume   Culture   Final    NO GROWTH 3 DAYS Performed at [facility], [address], [place], [place] [address]    Report Status PENDING  Incomplete  Respiratory Panel by RT PCR (Flu A&B, Covid) - Nasopharyngeal Swab     Status: None   Collection Time: 09/15/20 10:05 AM   Specimen: Nasopharyngeal Swab  Result Value Ref Range Status   SARS Coronavirus 2 by RT PCR NEGATIVE NEGATIVE Final    Comment: (NOTE) SARS-CoV-2 target nucleic acids are NOT DETECTED.  The SARS-CoV-2 RNA is generally detectable in upper respiratoy specimens during the acute phase of infection. The lowest concentration of SARS-CoV-2 viral copies this assay can detect is 131 copies/mL. A negative result does not preclude SARS-Cov-2 infection and should not be used as the sole basis for treatment or other patient management decisions. A negative result may occur with  improper specimen collection/handling, submission of  specimen other than nasopharyngeal swab, presence of viral mutation(s) within the areas targeted by this assay, and inadequate number of viral copies (<131 copies/mL). A negative result must be combined with clinical observations, patient history, and epidemiological information. The expected result is Negative.  Fact Sheet for Patients:  PinkCheek.be  Fact Sheet for Healthcare Providers:  GravelBags.it  This test is no t yet approved or cleared by the [place] FDA and  has been authorized for  detection and/or diagnosis of SARS-CoV-2 by FDA under an Emergency Use Authorization (EUA). This EUA will remain  in effect (meaning this test can be used) for the duration of the COVID-19 declaration under Section 564(b)(1) of the Act, 21 U.S.C. section 360bbb-3(b)(1), unless the authorization is terminated or revoked sooner.     Influenza A by PCR NEGATIVE NEGATIVE Final   Influenza B by PCR NEGATIVE NEGATIVE Final    Comment: (NOTE) The Xpert Xpress SARS-CoV-2/FLU/RSV assay is intended as an aid in  the diagnosis of influenza from Nasopharyngeal swab specimens and  should not be used as a sole basis for treatment. Nasal washings and  aspirates are unacceptable for Xpert Xpress SARS-CoV-2/FLU/RSV  testing.  Fact Sheet for Patients: PinkCheek.be  Fact Sheet for Healthcare Providers: GravelBags.it  This test is not yet approved or cleared by the [place] FDA and  has been authorized for detection and/or diagnosis of SARS-CoV-2 by  FDA under an Emergency Use Authorization (EUA). This EUA will remain  in effect (meaning this test can be used) for the duration of the  Covid-19 declaration under Section 564(b)(1) of the Act, 21  U.S.C. section 360bbb-3(b)(1), unless the authorization is  terminated or revoked. Performed at [facility], [address], [place], [place] [address]   Urine Culture     Status: None   Collection Time: 09/15/20 12:46 PM   Specimen: Urine, Random  Result Value Ref Range Status   Specimen Description   Final    URINE, RANDOM Performed at [facility], [address], [place], [place] [address]    Special Requests   Final    NONE Performed at [facility], [address] [place], [place] [address]    Culture   Final    NO GROWTH Performed at [facility], [place] [address] [place], [place] [address]    Report Status 09/16/2020 FINAL  Final  Blood  culture (single)     Status: None (Preliminary result)   Collection Time: 09/15/20 12:46 PM   Specimen: BLOOD  Result Value Ref Range Status   Specimen Description BLOOD LEFT ANTECUBITAL  Final   Special Requests   Final    BOTTLES DRAWN AEROBIC AND ANAEROBIC Blood Culture adequate volume   Culture   Final    NO GROWTH 3 DAYS Performed at [facility], [place] [place],  [address]    Report Status PENDING  Incomplete     Labs: Basic Metabolic Panel: Recent Labs  Lab 09/15/20 1000 09/15/20 1430 09/16/20 0448 09/17/20 0514 09/17/20 0523  NA 142  --  137 140  --   K 3.8  --  3.7 3.7  --   CL 109  --  109 113*  --   CO2 23  --  23 22  --   GLUCOSE 119*  --  99 99  --   BUN 14  --  13 12  --   CREATININE 0.88  --  0.86 0.88  --  CALCIUM 8.3*  --  8.1* 8.2*  --   MG  --  1.6*  --   --  2.1  PHOS  --  3.5  --   --   --    Liver Function Tests: Recent Labs  Lab 09/15/20 1000 09/16/20 0448  AST 21 19  ALT 20 18  ALKPHOS 119 106  BILITOT 0.6 0.6  PROT 6.7 6.2*  ALBUMIN 3.6 3.3*   Recent Labs  Lab 09/15/20 1000  LIPASE 29   No results for input(s): AMMONIA in the last 168 hours. CBC: Recent Labs  Lab 09/15/20 1000 09/16/20 0448 09/17/20 0514 09/18/20 0645  WBC 14.8* 16.4* 8.1 7.2  NEUTROABS 13.1*  --  6.0 4.9  HGB 10.3* 9.6* 8.7* 9.1*  HCT 32.6* 31.0* 28.7* 29.3*  MCV 85.1 86.6 86.7 85.9  PLT 341 282 232 267   Cardiac Enzymes: No results for input(s): CKTOTAL, CKMB, CKMBINDEX, TROPONINI in the last 168 hours. BNP: BNP (last 3 results) No results for input(s): BNP in the last 8760 hours.  ProBNP (last 3 results) No results for input(s): PROBNP in the last 8760 hours.  CBG: Recent Labs  Lab 09/15/20 0948 09/16/20 0834 09/16/20 1040 09/16/20 1533 09/16/20 1820  GLUCAP 79 101* 100* 99 132*       Signed:  Alma Friendly, MD [facility] 09/18/2020, 10:55 AM

## 2020-09-18 NOTE — Discharge Instructions (Signed)
Food Choices to Help Relieve Diarrhea, Adult When you have diarrhea, the foods you eat and your eating habits are very important. Choosing the right foods and drinks can help:  Relieve diarrhea.  Replace lost fluids and nutrients.  Prevent dehydration. What general guidelines should I follow?  Relieving diarrhea  Choose foods with less than 2 g or .07 oz. of fiber per serving.  Limit fats to less than 8 tsp (38 g or 1.34 oz.) a day.  Avoid the following: ? Foods and beverages sweetened with high-fructose corn syrup, honey, or sugar alcohols such as xylitol, sorbitol, and mannitol. ? Foods that contain a lot of fat or sugar. ? Fried, greasy, or spicy foods. ? High-fiber grains, breads, and cereals. ? Raw fruits and vegetables.  Eat foods that are rich in probiotics. These foods include dairy products such as yogurt and fermented milk products. They help increase healthy bacteria in the stomach and intestines (gastrointestinal tract, or GI tract).  If you have lactose intolerance, avoid dairy products. These may make your diarrhea worse.  Take medicine to help stop diarrhea (antidiarrheal medicine) only as told by your health care provider. Replacing nutrients  Eat small meals or snacks every 3-4 hours.  Eat bland foods, such as white rice, toast, or baked potato, until your diarrhea starts to get better. Gradually reintroduce nutrient-rich foods as tolerated or as told by your health care provider. This includes: ? Well-cooked protein foods. ? Peeled, seeded, and soft-cooked fruits and vegetables. ? Low-fat dairy products.  Take vitamin and mineral supplements as told by your health care provider. Preventing dehydration  Start by sipping water or a special solution to prevent dehydration (oral rehydration solution, ORS). Urine that is clear or pale yellow means that you are getting enough fluid.  Try to drink at least 8-10 cups of fluid each day to help replace lost  fluids.  You may add other liquids in addition to water, such as clear juice or decaffeinated sports drinks, as tolerated or as told by your health care provider.  Avoid drinks with caffeine, such as coffee, tea, or soft drinks.  Avoid alcohol. What foods are recommended?     The items listed may not be a complete list. Talk with your health care provider about what dietary choices are best for you. Grains White rice. White, French, or pita breads (fresh or toasted), including plain rolls, buns, or bagels. White pasta. Saltine, soda, or graham crackers. Pretzels. Low-fiber cereal. Cooked cereals made with water (such as cornmeal, farina, or cream cereals). Plain muffins. Matzo. Melba toast. Zwieback. Vegetables Potatoes (without the skin). Most well-cooked and canned vegetables without skins or seeds. Tender lettuce. Fruits Apple sauce. Fruits canned in juice. Cooked apricots, cherries, grapefruit, peaches, pears, or plums. Fresh bananas and cantaloupe. Meats and other protein foods Baked or boiled chicken. Eggs. Tofu. Fish. Seafood. Smooth nut butters. Ground or well-cooked tender beef, ham, veal, lamb, pork, or poultry. Dairy Plain yogurt, kefir, and unsweetened liquid yogurt. Lactose-free milk, buttermilk, skim milk, or soy milk. Low-fat or nonfat hard cheese. Beverages Water. Low-calorie sports drinks. Fruit juices without pulp. Strained tomato and vegetable juices. Decaffeinated teas. Sugar-free beverages not sweetened with sugar alcohols. Oral rehydration solutions, if approved by your health care provider. Seasoning and other foods Bouillon, broth, or soups made from recommended foods. What foods are not recommended? The items listed may not be a complete list. Talk with your health care provider about what dietary choices are best for you. Grains Whole   grain, whole wheat, bran, or rye breads, rolls, pastas, and crackers. Wild or brown rice. Whole grain or bran cereals. Barley.  Oats and oatmeal. Corn tortillas or taco shells. Granola. Popcorn. Vegetables Raw vegetables. Fried vegetables. Cabbage, broccoli, Brussels sprouts, artichokes, baked beans, beet greens, corn, kale, legumes, peas, sweet potatoes, and yams. Potato skins. Cooked spinach and cabbage. Fruits Dried fruit, including raisins and dates. Raw fruits. Stewed or dried prunes. Canned fruits with syrup. Meat and other protein foods Fried or fatty meats. Deli meats. Chunky nut butters. Nuts and seeds. Beans and lentils. Bacon. Hot dogs. Sausage. Dairy High-fat cheeses. Whole milk, chocolate milk, and beverages made with milk, such as milk shakes. Half-and-half. Cream. sour cream. Ice cream. Beverages Caffeinated beverages (such as coffee, tea, soda, or energy drinks). Alcoholic beverages. Fruit juices with pulp. Prune juice. Soft drinks sweetened with high-fructose corn syrup or sugar alcohols. High-calorie sports drinks. Fats and oils Butter. Cream sauces. Margarine. Salad oils. Plain salad dressings. Olives. Avocados. Mayonnaise. Sweets and desserts Sweet rolls, doughnuts, and sweet breads. Sugar-free desserts sweetened with sugar alcohols such as xylitol and sorbitol. Seasoning and other foods Honey. Hot sauce. Chili powder. Gravy. Cream-based or milk-based soups. Pancakes and waffles. Summary  When you have diarrhea, the foods you eat and your eating habits are very important.  Make sure you get at least 8-10 cups of fluid each day, or enough to keep your urine clear or pale yellow.  Eat bland foods and gradually reintroduce healthy, nutrient-rich foods as tolerated, or as told by your health care provider.  Avoid high-fiber, fried, greasy, or spicy foods. This information is not intended to replace advice given to you by your health care provider. Make sure you discuss any questions you have with your health care provider. Document Revised: 02/12/2019 Document Reviewed: 10/19/2016 Elsevier Patient  Education  2020 [employer]  

## 2020-09-20 LAB — CULTURE, BLOOD (SINGLE)
Culture: NO GROWTH
Culture: NO GROWTH
Special Requests: ADEQUATE
Special Requests: ADEQUATE

## 2021-05-30 ENCOUNTER — Inpatient Hospital Stay
Admission: EM | Admit: 2021-05-30 | Discharge: 2021-06-02 | DRG: 101 | Disposition: A | Discharge status: Home / Self Care | Payer: Commercial Managed Care - POS | Attending: Internal Medicine | Admitting: Internal Medicine

## 2021-05-30 DIAGNOSIS — F419 Anxiety disorder, unspecified: Secondary | ICD-10-CM | POA: Diagnosis present

## 2021-05-30 DIAGNOSIS — Z823 Family history of stroke: Secondary | ICD-10-CM

## 2021-05-30 DIAGNOSIS — Z9884 Bariatric surgery status: Secondary | ICD-10-CM

## 2021-05-30 DIAGNOSIS — R4781 Slurred speech: Secondary | ICD-10-CM | POA: Diagnosis present

## 2021-05-30 DIAGNOSIS — Z8616 Personal history of COVID-19: Secondary | ICD-10-CM

## 2021-05-30 DIAGNOSIS — F32A Depression, unspecified: Secondary | ICD-10-CM | POA: Diagnosis present

## 2021-05-30 DIAGNOSIS — E669 Obesity, unspecified: Secondary | ICD-10-CM | POA: Diagnosis present

## 2021-05-30 DIAGNOSIS — H532 Diplopia: Secondary | ICD-10-CM | POA: Diagnosis present

## 2021-05-30 DIAGNOSIS — E876 Hypokalemia: Secondary | ICD-10-CM | POA: Diagnosis present

## 2021-05-30 DIAGNOSIS — M329 Systemic lupus erythematosus, unspecified: Secondary | ICD-10-CM | POA: Diagnosis present

## 2021-05-30 DIAGNOSIS — K3184 Gastroparesis: Secondary | ICD-10-CM | POA: Diagnosis present

## 2021-05-30 DIAGNOSIS — G40909 Epilepsy, unspecified, not intractable, without status epilepticus: Principal | ICD-10-CM | POA: Diagnosis present

## 2021-05-30 DIAGNOSIS — Z79890 Hormone replacement therapy (postmenopausal): Secondary | ICD-10-CM

## 2021-05-30 DIAGNOSIS — E039 Hypothyroidism, unspecified: Secondary | ICD-10-CM | POA: Diagnosis present

## 2021-05-30 DIAGNOSIS — Z79899 Other long term (current) drug therapy: Secondary | ICD-10-CM

## 2021-05-30 DIAGNOSIS — Z885 Allergy status to narcotic agent status: Secondary | ICD-10-CM

## 2021-05-30 DIAGNOSIS — D649 Anemia, unspecified: Secondary | ICD-10-CM | POA: Diagnosis present

## 2021-05-30 DIAGNOSIS — R569 Unspecified convulsions: Secondary | ICD-10-CM | POA: Diagnosis present

## 2021-05-30 DIAGNOSIS — Z6836 Body mass index (BMI) 36.0-36.9, adult: Secondary | ICD-10-CM

## 2021-05-30 HISTORY — DX: Systemic lupus erythematosus, unspecified: M32.9

## 2021-05-30 HISTORY — DX: Unspecified convulsions: R56.9

## 2021-05-30 HISTORY — DX: Reserved for concepts with insufficient information to code with codable children: IMO0002

## 2021-05-30 HISTORY — DX: Disorder of thyroid, unspecified: E07.9

## 2021-05-30 LAB — I-STAT CHEM 8 CARTRIDGE
Anion Gap I-Stat: 16 (ref 7.0–16.0)
BUN I-Stat: 11 mg/dL (ref 7–22)
Calcium Ionized I-Stat: 4.9 mg/dL (ref 4.35–5.10)
Chloride I-Stat: 107 mMol/L (ref 98–110)
Creatinine I-Stat: 0.9 mg/dL (ref 0.60–1.20)
EGFR: 73 mL/min/{1.73_m2} (ref 60–150)
Glucose I-Stat: 100 mg/dL — ABNORMAL HIGH (ref 71–99)
Hematocrit I-Stat: 37 % (ref 36.0–48.0)
Hemoglobin I-Stat: 12.6 gm/dL (ref 12.0–16.0)
Potassium I-Stat: 3.4 mMol/L — ABNORMAL LOW (ref 3.5–5.3)
Sodium I-Stat: 142 mMol/L (ref 136–147)
TCO2 I-Stat: 24 mMol/L (ref 24–29)

## 2021-05-30 LAB — ECG 12-LEAD
P Wave Axis: 65 deg
P-R Interval: 164 ms
Patient Age: 60 years
Q-T Interval(Corrected): 451 ms
Q-T Interval: 391 ms
QRS Axis: 13 deg
QRS Duration: 82 ms
T Axis: 27 years
Ventricular Rate: 80 //min

## 2021-05-30 LAB — VH I-STAT CHEM 8 NOTIFICATION

## 2021-05-30 MED ORDER — SODIUM CHLORIDE (PF) 0.9 % IJ SOLN
3.0000 mL | Freq: Two times a day (BID) | INTRAMUSCULAR | Status: DC
Start: 2021-05-31 — End: 2021-06-02
  Administered 2021-05-31 (×3): 3 mL via INTRAVENOUS

## 2021-05-30 MED ORDER — SODIUM CHLORIDE (PF) 0.9 % IJ SOLN
3.0000 mL | Freq: Two times a day (BID) | INTRAMUSCULAR | Status: DC
Start: 2021-05-31 — End: 2021-06-02
  Administered 2021-05-31 (×2): 3 mL via INTRAVENOUS

## 2021-05-30 MED ORDER — MODAFINIL 100 MG PO TABS
200.0000 mg | ORAL_TABLET | Freq: Every day | ORAL | Status: DC
Start: 2021-05-31 — End: 2021-06-02
  Administered 2021-05-31 – 2021-06-02 (×3): 200 mg via ORAL
  Filled 2021-05-30 (×3): qty 2

## 2021-05-30 MED ORDER — ACETAMINOPHEN 160 MG/5ML PO SOLN
650.0000 mg | ORAL | Status: DC | PRN
Start: 2021-05-30 — End: 2021-06-02

## 2021-05-30 MED ORDER — VH VITAMIN B-1 100 MG PO TABS (WRAP)
100.0000 mg | ORAL_TABLET | Freq: Every day | ORAL | Status: DC
Start: 2021-05-31 — End: 2021-06-02
  Administered 2021-05-31 – 2021-06-02 (×3): 100 mg via ORAL
  Filled 2021-05-30 (×3): qty 1

## 2021-05-30 MED ORDER — SODIUM CHLORIDE (PF) 0.9 % IJ SOLN
0.4000 mg | INTRAMUSCULAR | Status: DC | PRN
Start: 2021-05-30 — End: 2021-06-02

## 2021-05-30 MED ORDER — ENOXAPARIN SODIUM 40 MG/0.4ML IJ SOSY
40.0000 mg | PREFILLED_SYRINGE | INTRAMUSCULAR | Status: DC
Start: 2021-05-31 — End: 2021-05-30

## 2021-05-30 MED ORDER — SODIUM CHLORIDE 0.9 % IV SOLN
INTRAVENOUS | Status: DC
Start: 2021-05-31 — End: 2021-06-01

## 2021-05-30 MED ORDER — BRIVARACETAM 50 MG PO TABS
50.0000 mg | ORAL_TABLET | Freq: Two times a day (BID) | ORAL | Status: DC
Start: 2021-05-31 — End: 2021-06-02
  Administered 2021-05-31 – 2021-06-02 (×5): 50 mg via ORAL
  Filled 2021-05-30 (×5): qty 1

## 2021-05-30 MED ORDER — ENOXAPARIN SODIUM 40 MG/0.4ML IJ SOSY
40.0000 mg | PREFILLED_SYRINGE | INTRAMUSCULAR | Status: DC
Start: 2021-05-31 — End: 2021-06-02
  Administered 2021-05-31 – 2021-06-01 (×3): 40 mg via SUBCUTANEOUS
  Filled 2021-05-30 (×3): qty 0.4

## 2021-05-30 MED ORDER — FOLIC ACID 1 MG PO TABS
1.0000 mg | ORAL_TABLET | Freq: Every day | ORAL | Status: DC
Start: 2021-05-31 — End: 2021-06-02
  Administered 2021-05-31 – 2021-06-02 (×3): 1 mg via ORAL
  Filled 2021-05-30 (×3): qty 1

## 2021-05-30 MED ORDER — CEROVITE ADVANCED FORMULA PO TABS
1.0000 | ORAL_TABLET | Freq: Every day | ORAL | Status: DC
Start: 2021-05-31 — End: 2021-06-02
  Administered 2021-05-31 – 2021-06-02 (×3): 1 via ORAL
  Filled 2021-05-30 (×3): qty 1

## 2021-05-30 MED ORDER — LEVOTHYROXINE SODIUM 75 MCG PO TABS
75.0000 ug | ORAL_TABLET | Freq: Every day | ORAL | Status: DC
Start: 2021-05-31 — End: 2021-06-02
  Administered 2021-05-31 – 2021-06-02 (×3): 75 ug via ORAL
  Filled 2021-05-30 (×3): qty 1

## 2021-05-30 MED ORDER — ACETAMINOPHEN 325 MG PO TABS
650.0000 mg | ORAL_TABLET | ORAL | Status: DC | PRN
Start: 2021-05-30 — End: 2021-06-02
  Administered 2021-05-31 (×2): 650 mg via ORAL
  Filled 2021-05-30 (×2): qty 2

## 2021-05-30 MED ORDER — PATIENT SUPPLIED NON FORMULARY
1.0000 | Freq: Every day | Status: DC
Start: 2021-05-31 — End: 2021-06-02

## 2021-05-30 MED ORDER — TOPIRAMATE 25 MG PO TABS
50.0000 mg | ORAL_TABLET | Freq: Two times a day (BID) | ORAL | Status: DC
Start: 2021-05-31 — End: 2021-05-31
  Administered 2021-05-31: 50 mg via ORAL
  Filled 2021-05-30 (×2): qty 2

## 2021-05-30 MED ORDER — DIAZEPAM 10 MG RE GEL
2.5000 mg | Freq: Four times a day (QID) | RECTAL | Status: DC | PRN
Start: 2021-05-30 — End: 2021-06-02
  Filled 2021-05-30: qty 20

## 2021-05-30 MED ORDER — PANTOPRAZOLE SODIUM 40 MG PO TBEC
40.0000 mg | DELAYED_RELEASE_TABLET | Freq: Every morning | ORAL | Status: DC
Start: 2021-05-31 — End: 2021-06-02
  Administered 2021-05-31 – 2021-06-02 (×3): 40 mg via ORAL
  Filled 2021-05-30 (×3): qty 1

## 2021-05-30 MED ORDER — ACETAMINOPHEN 650 MG RE SUPP
650.0000 mg | RECTAL | Status: DC | PRN
Start: 2021-05-30 — End: 2021-06-02

## 2021-05-30 NOTE — ED Triage Notes (Signed)
Pt w/ hx seizures has been visiting w/ daughter since Saturday and comes in by EMS w/ reports of seizure activity that has been going on since Monday July 18. Pt reports the seizures have worsened. Daughter has witnessed this activity and called 911. Pt c/o headache 9/10 at this time.

## 2021-05-30 NOTE — H&P (Signed)
Medicine History & Physical   [place]  [facility]   Patient Name: Laura Mclaughlin,Laura Mclaughlin LOS: 0 days   Attending Physician: Joesphine Bare, MD PCP: Patsy Lager, MD      Assessment and Plan:                                                                Increasing frequency of seizure activity  Known epilepsy  Resume briviact, xcopri, topamax  Discussed with neurology  Video EEG monitoring  Neuro check  Seizure precaution    Hypokalemia  Replace    Hypothyroidism  Resume synthroid    Gastroparesis  Resume protonic for now, defer reglan    Obesity with BMI of 36  Advised weight reduction    DVT PPx: lovenox  Dispo: Observation    Code: full code     History of Presenting Illness                                CC: seizure activity    Laura Mclaughlin is a 60 y.o. female patient history of hypothyroidism, SLE and epilepsy who presented with seizure activity.  It was reported the patient had recurring seizure this month.  Followed by Dr. Dana Allan in [place] and takes Xcopri, Topamax and diazepam.  Because of increased frequency of seizures she was prescribed Briviact the day prior to admission. This afternoon, while patient was in the care with her daughter, she lost consciousness. Daughter reports nonconvulsive seizure activity.  The patient afterwards was noted to be confused and later regained her baseline mental status.  This made her come to ER for further evaluation.  They have been in touch with their neurologist who recommended a 48-hour video EEG monitoring.    She has normal vital signs.  MP showed a potassium of 3.4 and glucose of 100 and the rest of electrolytes are essentially normal.  EKG is in normal sinus rhythm.  Past Medical History:   Diagnosis Date    Convulsions     Disorder of thyroid     Lupus      History reviewed. No pertinent surgical history.    History reviewed. No pertinent family history.  Social History     Tobacco Use    Smoking status: Never    Smokeless  tobacco: Never   Vaping Use    Vaping Use: Never used   Substance Use Topics    Alcohol use: Never    Drug use: Never       Subjective   Review of Systems:  Review of systems as noted in HPI. Full 12 point ROS otherwise negative.         Objective   Physical Exam:     Vitals: T:98.2 F (36.8 C) (Oral),  BP:110/75, HR:85, RR:14, SaO2:99%    1) General Appearance: Alert and oriented x 4. In no acute distress.  Maintained in room air  2) Eyes: Pink conjunctiva, anicteric sclera. Pupils are equally reactive to light.  3) ENT: Oral mucosa moist with no pharyngeal congestion, erythema or swelling.  4) Neck: Supple, with full range of motion. Trachea is central, no JVD noted  5) Chest: Clear to auscultation bilaterally, no wheezes  or rhonchi.  6) CVS: normal rate and regular rhythm, with no murmurs.  7) Abdomen: Obese soft, non-tender, no palpable mass. Bowel sounds normal. No CVA tenderness  8) Extremities: No pitting edema, pulses palpable, no calf swelling and no gross deformity.  9) Skin: Warm, dry with normal skin turgor, no rash  10) Lymphatics: No lymphadenopathy in axillary, cervical and inguinal area.   11) Neurological: Cranial nerves II-XII intact. No gross focal motor or sensory deficits noted.  12) Psychiatric: Affect is appropriate. No hallucinations.    EKG: Per my review shows sinus rhythm      Patient Vitals for the past 12 hrs:   BP Temp Pulse Resp   05/30/21 1906 110/75 98.2 F (36.8 C) 85 14     Weight Monitoring 05/30/2021   Height 175.3 cm   Height Method Stated   Weight 113.399 kg   Weight Method Stated   BMI (calculated) 37 kg/m2           LABS:  Estimated Creatinine Clearance: 89.3 mL/min (based on SCr of 0.9 mg/dL).   Recent Results (from the past 24 hour(s))   Chem 8 plus only (i-STAT)    Collection Time: 05/30/21  7:00 PM   Result Value Ref Range    I-STAT Notification Istat Notification    i-Stat Chem 8 CartrIDge    Collection Time: 05/30/21  7:10 PM   Result Value Ref Range    Sodium I-Stat  142 136 - 147 mMol/Mclaughlin    Potassium I-Stat 3.4 (Mclaughlin) 3.5 - 5.3 mMol/Mclaughlin    Chloride I-Stat 107 98 - 110 mMol/Mclaughlin    TCO2 I-Stat 24 24 - 29 mMol/Mclaughlin    Calcium Ionized I-Stat 4.90 4.35 - 5.10 mg/dL    Glucose I-Stat 161 (H) 71 - 99 mg/dL    Creatinine I-Stat 0.96 0.60 - 1.20 mg/dL    BUN I-Stat 11 7 - 22 mg/dL    Anion Gap I-Stat 04.5 7.0 - 16.0    EGFR 73 60 - 150 mL/min/1.48m2    Hematocrit I-Stat 37.0 36.0 - 48.0 %    Hemoglobin I-Stat 12.6 12.0 - 16.0 gm/dL   ECG 12 lead (Stat) (Cardiac Related)    Collection Time: 05/30/21  7:13 PM   Result Value Ref Range    Patient Age 63 years    Patient DOB [birth year]     Patient Height      Patient Weight      Interpretation Text       Sinus rhythm  Low voltage, precordial leads  No previous ECG available for comparison  Electronically Signed On 05-30-2021 19:19:20 EDT by Elray Mcgregor      Physician Interpreter Elray Mcgregor     Ventricular Rate 80 //min    QRS Duration 82 ms    P-R Interval 164 ms    Q-T Interval 391 ms    Q-T Interval(Corrected) 451 ms    P Wave Axis 65 deg    QRS Axis 13 deg    T Axis 27 years          Allergies   Allergen Reactions    Dilaudid [Hydromorphone] Irregular Heart Rate    Reglan [Metoclopramide] Other (See Comments)     Tardive dyskinesia      No results found.   Home Medications       Med List Status: Complete Set By: Marcha Dutton, RN at 05/30/2021  7:17 PM  Brivaracetam (Briviact) 50 MG Tab tablet     Take 50 mg by mouth every 12 (twelve) hours     Cenobamate (Xcopri) 14 x 150 MG & 14 x200 MG Tablet Therapy Pack     Take by mouth     levothyroxine (SYNTHROID) 75 MCG tablet     Take 75 mcg by mouth Once a day at 6:00am     modafinil (PROVIGIL) 200 MG tablet     Take 200 mg by mouth daily     pantoprazole (PROTONIX) 40 MG tablet     Take 40 mg by mouth daily     topiramate (TOPAMAX) 50 MG tablet     Take 50 mg by mouth 2 (two) times daily           Meds given in the ED:  Medications - No data to display   Time Spent:     Jenness Corner, MD     05/30/21,8:53 PM   MRN: [id number]                                      CSN: [id number] DOB: 12/12/1960

## 2021-05-30 NOTE — ED Provider Notes (Addendum)
[facility] EMERGENCY DEPARTMENT History and Physical Exam      Patient Name: Laura Mclaughlin, Laura Mclaughlin  Encounter Date:  05/30/2021  Attending Physician: Joesphine Bare, MD  PCP: Patsy Lager, MD  Patient DOB:  1960/11/21  MRN:  [id number]  Room:  C16/C16-A      History of Presenting Illness     Chief complaint: Seizures    HPI/ROS is limited by: none  HPI/ROS given by: patient and family    Location: Generalized  Duration: Chronic several episodes last several days  Severity: moderate    Laura Mclaughlin is a 60 y.o. female who presents with seizure with last seizure witnessed by daughter at 3:30pm today. 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 She sees Dr. Jeannie Done and takes xcopri, topamax, and diazepam. She was prescribed new medication Brivact as of yesterday due to increased frequency of seizures. 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 Denies CP, SOB, n/v/d, weakness, or changes in urinary or bowel movements. She is scheduled for 48 hour EEG in August and considering       Review of Systems   Review of Systems   Constitutional:  Positive for malaise/fatigue. Negative for chills and fever.   HENT:  Negative for congestion.    Eyes:  Positive for double vision (diagnosed with Duane syndrome type 3; followed by ophthalmologist).   Respiratory:  Negative for cough and shortness of breath.    Cardiovascular:  Negative for chest pain, palpitations and leg swelling.   Gastrointestinal:  Negative for abdominal pain, constipation, diarrhea, nausea and vomiting.   Genitourinary:  Negative for dysuria, frequency and urgency.   Musculoskeletal:  Positive for neck pain.   Neurological:  Positive for seizures (history of grand mal seizures 30+ years) and headaches. Negative for sensory change, speech change, focal weakness and weakness.   Psychiatric/Behavioral:  The patient has insomnia  (restlessness).          Allergies     Pt is allergic to dilaudid [hydromorphone] and reglan [metoclopramide].    Medications     No current facility-administered medications for this encounter.    Current Outpatient Medications:     Brivaracetam (Briviact) 50 MG Tab tablet, Take 50 mg by mouth every 12 (twelve) hours, Disp: , Rfl:     Cenobamate (Xcopri) 14 x 150 MG & 14 x200 MG Tablet Therapy Pack, Take by mouth, Disp: , Rfl:     levothyroxine (SYNTHROID) 75 MCG tablet, Take 75 mcg by mouth Once a day at 6:00am, Disp: , Rfl:     modafinil (PROVIGIL) 200 MG tablet, Take 200 mg by mouth daily, Disp: , Rfl:     pantoprazole (PROTONIX) 40 MG tablet, Take 40 mg by mouth daily, Disp: , Rfl:     topiramate (TOPAMAX) 50 MG tablet, Take 50 mg by mouth 2 (two) times daily, Disp: , Rfl:    Medications were reviewed by md  Past Medical History     Pt  Past Medical History:   Diagnosis Date    Convulsions     Disorder of thyroid     Lupus      Past medical history was reviewed by md  Past Surgical History     PtHistory reviewed. No pertinent surgical history.  Past surgical history was reviewed by md  Family History   History reviewed.  No pertinent family history.  The family history was reviewed by md  Social History     Pt  Social History     Socioeconomic History    Marital status: Married     Spouse name: None    Number of children: None    Years of education: None    Highest education level: None   Occupational History    None   Tobacco Use    Smoking status: Never    Smokeless tobacco: Never   Vaping Use    Vaping Use: Never used   Substance and Sexual Activity    Alcohol use: Never    Drug use: Never    Sexual activity: None   Other Topics Concern    None   Social History Narrative    None     Social Determinants of Health     Financial Resource Strain: Not on file   Food Insecurity: Not on file   Transportation Needs: Not on file   Physical Activity: Not on file   Stress: Not on file   Social Connections: Not on file    Intimate Partner Violence: Not on file   Housing Stability: Not on file     Social history reviewed by md  Physical Exam     Blood pressure 110/75, pulse 85, temperature 98.2 F (36.8 C), temperature source Oral, resp. rate 14, height 1.753 m, weight 113.4 kg, SpO2 99 %.    Physical Exam  Vitals reviewed.   Constitutional:       Appearance: She is not ill-appearing or toxic-appearing.      Comments: Patient is tearful upon examination   HENT:      Head: Normocephalic and atraumatic.      Nose: Nose normal. No congestion or rhinorrhea.      Mouth/Throat:      Mouth: Mucous membranes are moist.   Eyes:      General: No scleral icterus.        Right eye: No discharge.         Left eye: No discharge.      Conjunctiva/sclera: Conjunctivae normal.      Pupils: Pupils are equal, round, and reactive to light.      Comments: Photophobia, left eye with horizontal gaze defect   Cardiovascular:      Rate and Rhythm: Normal rate and regular rhythm.      Heart sounds: Normal heart sounds. No murmur heard.    No friction rub. No gallop.   Pulmonary:      Effort: Pulmonary effort is normal.      Breath sounds: Normal breath sounds. No wheezing or rales.   Abdominal:      Palpations: Abdomen is soft.   Musculoskeletal:      Cervical back: Normal range of motion and neck supple.   Skin:     General: Skin is warm and dry.   Neurological:      General: No focal deficit present.      Mental Status: She is oriented to person, place, and time.           Orders Placed     Orders Placed This Encounter   Procedures    Chem 8 plus only (i-STAT)    i-Stat Chem 8 CartrIDge    ECG 12 lead (Stat) (Cardiac Related)    ED Admission Request       Diagnostic Results       The results of the diagnostic  studies below have been reviewed by myself:    Labs  Results       Procedure Component Value Units Date/Time    i-Stat Chem 8 CartrIDge [[id number]]  (Abnormal) Collected: 05/30/21 1910    Specimen: Blood Updated: 05/30/21 1914     Sodium I-Stat 142  mMol/L      Potassium I-Stat 3.4 mMol/L      Chloride I-Stat 107 mMol/L      TCO2 I-Stat 24 mMol/L      Calcium Ionized I-Stat 4.90 mg/dL      Glucose I-Stat 409 mg/dL      Creatinine I-Stat 0.90 mg/dL      BUN I-Stat 11 mg/dL      Anion Gap I-Stat 81.1     EGFR 73 mL/min/1.46m2      Hematocrit I-Stat 37.0 %      Hemoglobin I-Stat 12.6 gm/dL     Chem 8 plus only (i-STAT) [[id number]] Collected: 05/30/21 1900    Specimen: ISTAT Updated: 05/30/21 1906     I-STAT Notification Istat Notification            Radiologic Studies  Radiology Results (24 Hour)       ** No results found for the last 24 hours. **            EKG: See Epiphany for formal interpretation.      MDM / Critical Care     Blood pressure 110/75, pulse 85, temperature 98.2 F (36.8 C), temperature source Oral, resp. rate 14, height 1.753 m, weight 113.4 kg, SpO2 99 %.    This patient presents to the Emergency Department with seizures.  Evaluation and treatment has been initiated in the ER, but the patient has not had significant improvement in symptoms, has had recurrent seizures resistant to treatment and/or appears ill enough with illness/co-morbidities that make admission for IV medications, neurological consultation, and further management the most appropriate disposition. Differential diagnosis has included but is not limited to status epilepticus, intracranial pathology (tumors, hydrocephalus), meningitis/encephalitis, intracranial bleeding, alcohol withdrawal and toxic/metabolic causes for seizures.  Diagnostic impression and plan were discussed and agreed upon with the patient and/or family.  Results of lab/radiology tests were reviewed and discussed with the patient and/or family. Questions were answered and concerns addressed.  Appropriate consultation was made for admission and further treatment of this patient.  Patient is aware that she cannot drive until cleared by neurology.  She stated that she would like to go home but notes that she  cannot go home at this juncture but intends to drive home.  She is aware that she cannot drive and daughter was also present for this conversation  < 30 min       Procedures             Diagnosis / Disposition     Clinical Impression  1. Seizure        Disposition  ED Disposition       ED Disposition   Admit    Condition   --    Date/Time   Tue May 30, 2021  8:47 PM    Comment   Service: Medicine [106]                 Prescriptions  New Prescriptions    No medications on file                  Joesphine Bare, MD  05/30/21 2105  Joesphine Bare, MD  05/30/21 2106

## 2021-05-30 NOTE — Plan of Care (Signed)
Problem: Neurological Deficit  Goal: Neurological status is stable or improving  Outcome: Progressing  Flowsheets (Taken 05/30/2021 2350)  Neurological status is stable or improving: Observe for seizure activity and initiate seizure precautions if indicated

## 2021-05-31 ENCOUNTER — Encounter: Payer: Self-pay | Admitting: Internal Medicine

## 2021-05-31 LAB — BASIC METABOLIC PANEL
Anion Gap: 10.7 mMol/L (ref 7.0–18.0)
BUN / Creatinine Ratio: 12.3 Ratio (ref 10.0–30.0)
BUN: 10 mg/dL (ref 7–22)
CO2: 21 mMol/L (ref 20–30)
Calcium: 8.4 mg/dL — ABNORMAL LOW (ref 8.5–10.5)
Chloride: 113 mMol/L — ABNORMAL HIGH (ref 98–110)
Creatinine: 0.81 mg/dL (ref 0.60–1.20)
EGFR: 83 mL/min/{1.73_m2} (ref 60–150)
Glucose: 92 mg/dL (ref 71–99)
Osmolality Calculated: 280 mOsm/kg (ref 275–300)
Potassium: 3.7 mMol/L (ref 3.5–5.3)
Sodium: 141 mMol/L (ref 136–147)

## 2021-05-31 LAB — CBC AND DIFFERENTIAL
Basophils %: 0.4 % (ref 0.0–3.0)
Basophils Absolute: 0 10*3/uL (ref 0.0–0.3)
Eosinophils %: 4.3 % (ref 0.0–7.0)
Eosinophils Absolute: 0.3 10*3/uL (ref 0.0–0.8)
Hematocrit: 33 % — ABNORMAL LOW (ref 36.0–48.0)
Hemoglobin: 10.9 gm/dL — ABNORMAL LOW (ref 12.0–16.0)
Lymphocytes Absolute: 1.8 10*3/uL (ref 0.6–5.1)
Lymphocytes: 27.1 % (ref 15.0–46.0)
MCH: 31 pg (ref 28–35)
MCHC: 33 gm/dL (ref 32–36)
MCV: 94 fL (ref 80–100)
MPV: 7.9 fL (ref 6.0–10.0)
Monocytes Absolute: 0.3 10*3/uL (ref 0.1–1.7)
Monocytes: 4.6 % (ref 3.0–15.0)
Neutrophils %: 63.6 % (ref 42.0–78.0)
Neutrophils Absolute: 4.2 10*3/uL (ref 1.7–8.6)
PLT CT: 246 10*3/uL (ref 130–440)
RBC: 3.53 10*6/uL — ABNORMAL LOW (ref 3.80–5.00)
RDW: 12.6 % (ref 11.0–14.0)
WBC: 6.5 10*3/uL (ref 4.0–11.0)

## 2021-05-31 LAB — ECG 12-LEAD
P Wave Axis: 58 deg
P-R Interval: 176 ms
Patient Age: 60 years
Q-T Interval(Corrected): 446 ms
Q-T Interval: 402 ms
QRS Axis: -2 deg
QRS Duration: 87 ms
T Axis: 8 years
Ventricular Rate: 74 //min

## 2021-05-31 LAB — VH URINALYSIS WITH MICROSCOPIC
Bilirubin, UA: NEGATIVE
Blood, UA: NEGATIVE
Glucose, UA: NEGATIVE mg/dL
Ketones UA: NEGATIVE mg/dL
Nitrite, UA: NEGATIVE
Protein, UR: NEGATIVE mg/dL
RBC, UA: <1 /hpf (ref 0–5)
Squam Epithel, UA: 1 /hpf (ref 0–2)
Urine Specific Gravity: 1.015 (ref 1.001–1.040)
Urobilinogen, UA: 0.2 mg/dL — AB
WBC, UA: 3 /hpf (ref 0–4)
pH, Urine: 7 pH (ref 5.0–8.0)

## 2021-05-31 LAB — HEMOGLOBIN A1C
Estimated Average Glucose: 103 mg/dL
Hgb A1C, %: 5.2 %

## 2021-05-31 MED ORDER — PRAMIPEXOLE DIHYDROCHLORIDE 0.25 MG PO TABS
0.7500 mg | ORAL_TABLET | Freq: Every evening | ORAL | Status: DC
Start: 2021-05-31 — End: 2021-05-31

## 2021-05-31 MED ORDER — DIAZEPAM 5 MG PO TABS
10.0000 mg | ORAL_TABLET | Freq: Every evening | ORAL | Status: DC
Start: 2021-05-31 — End: 2021-06-02
  Administered 2021-05-31 – 2021-06-01 (×2): 10 mg via ORAL
  Filled 2021-05-31 (×2): qty 2

## 2021-05-31 MED ORDER — PRAMIPEXOLE DIHYDROCHLORIDE 0.25 MG PO TABS
0.7500 mg | ORAL_TABLET | Freq: Every evening | ORAL | Status: DC
Start: 2021-05-31 — End: 2021-06-02
  Administered 2021-05-31 – 2021-06-01 (×3): 0.75 mg via ORAL
  Filled 2021-05-31 (×3): qty 3

## 2021-05-31 MED ORDER — DIAZEPAM 5 MG PO TABS
5.0000 mg | ORAL_TABLET | Freq: Every evening | ORAL | Status: DC | PRN
Start: 2021-05-31 — End: 2021-05-31

## 2021-05-31 MED ORDER — TOPIRAMATE 25 MG PO TABS
150.0000 mg | ORAL_TABLET | Freq: Two times a day (BID) | ORAL | Status: DC
Start: 2021-05-31 — End: 2021-06-02
  Administered 2021-05-31 – 2021-06-02 (×4): 150 mg via ORAL
  Filled 2021-05-31 (×4): qty 2

## 2021-05-31 MED ORDER — VH POTASSIUM CHLORIDE CRYS ER 20 MEQ PO TBCR (WRAP)
40.0000 meq | EXTENDED_RELEASE_TABLET | Freq: Once | ORAL | Status: AC
Start: 2021-05-31 — End: 2021-05-31
  Administered 2021-05-31: 01:00:00 40 meq via ORAL
  Filled 2021-05-31: qty 2

## 2021-05-31 NOTE — Progress Notes (Signed)
Medicine Progress Note   [facility]  [facility]   Patient Name: Laura Mclaughlin, Laura Mclaughlin LOS: 0 days   Attending Physician: Obie Dredge, * PCP: Regis Bill, [facility] Course:                                                            Laura Mclaughlin is a 60 y.o. female patient  with history of hypothyroidism, SLE and epilepsy who presented with seizure activity.  It was reported the patient had recurring seizure this month.  Followed by a neurologist Dr. Jeannie Done at  [facility] and takes Xcopri, Topamax and diazepam.  Because of increased frequency of seizures she was prescribed Briviact the day prior to admission.  In the afternoon of her day of presentation,  while patient was in the care with her daughter, she lost consciousness. Daughter reports nonconvulsive seizure activity.  The patient afterwards was noted to be confused and later regained her baseline mental status.  This made her come to ER for further evaluation.  They have been in touch with their neurologist who recommended a 48-hour video EEG monitoring.     Assessment and Plan:    Increasing frequency of seizure activity  History of epilepsy  --continue with  briviact, xcopri, topamax,   -- Also on Valium 10 mg nightly  --Video EEG monitoring  --Neuro check  --Seizure precaution  --Neurology consult on board     Hypokalemia  --Resolved    Hypothyroidism  --Continue with synthroid    Gastroparesis  --Continue with protonic for now, defer reglan    Obesity with BMI of 36  Advised weight reduction    Disposition: tbd , on continuous video EEG monitoring  DVT PPX: Medication VTE Prophylaxis Orders: enoxaparin (LOVENOX) syringe 40 mg  Mechanical VTE Prophylaxis Orders: Mechanical VTE: Pneumatic Compression; Knee high  Code:  Full Code     Subjective   Reports mild headache.  Still feels more sleepy.  Denies any chest pain or shortness of breath denies any fever chills.  Denies numbness or weakness in extremities.         Objective   Physical Exam:     Vitals: T:97.2 F (36.2 C) (Temporal), BP:104/66, HR:73, RR:16, SaO2:99%      General: Patient is awake. In no acute distress.  HEENT: No conjunctival drainage, vision is intact, anicteric sclera.  Neck: Supple, no thyromegaly.  Chest: CTA bilaterally. No rhonchi, no wheezing. No use of accessory muscles.  CVS: Normal rate and regular rhythm no murmurs, without JVD, no pitting edema, pulses palpable.  Abdomen: Soft, non-tender, no guarding or rigidity, with normal bowel sounds.  Extremities: No calf swelling and no gross deformity.  Skin: Warm, dry, no rash and no worrisome lesions.  NEURO: No motor or sensory deficits.  Psychiatric: Alert, interactive, appropriate, normal affect.    Weight Monitoring 05/30/2021 05/30/2021   Height 175.3 cm 175.3 cm   Height Method Stated -   Weight 113.399 kg 113.4 kg   Weight Method Stated Standing Scale   BMI (calculated) 37 kg/m2 37 kg/m2           Intake/Output Summary (Last 24 hours) at 05/31/2021 0846  Last data filed at 05/31/2021 0318  Gross per 24 hour   Intake 200  ml   Output --   Net 200 ml     Body mass index is 36.9 kg/m.     Meds:     Current Facility-Administered Medications   Medication Dose Route Frequency    Brivaracetam  50 mg Oral Q12H SCH    enoxaparin  40 mg Subcutaneous Q24H    folic acid  1 mg Oral Daily    levothyroxine  75 mcg Oral Daily at 0600    modafinil  200 mg Oral Daily    multivitamin  1 tablet Oral Daily    pantoprazole  40 mg Oral QAM AC    NON-FORMULARY PAT OWN MED order form  1 tablet Oral Daily    pramipexole  0.75 mg Oral QHS    sodium chloride (PF)  3 mL Intravenous Q12H SCH    sodium chloride (PF)  3 mL Intravenous Q12H SCH    topiramate  50 mg Oral BID    vitamin B-1  100 mg Oral Daily      sodium chloride 75 mL/hr at 05/31/21 0032     PRN Meds: acetaminophen **OR** acetaminophen **OR** acetaminophen, diazePAM, diazePAM, naloxone, naloxone.     LABS:     Estimated Creatinine Clearance: 99.2 mL/min (based on  SCr of 0.81 mg/dL).  Recent Labs   Lab 05/31/21  0356   WBC 6.5   RBC 3.53*   Hemoglobin 10.9*   Hematocrit 33.0*   MCV 94   PLT CT 246             Lab Results   Component Value Date    HGBA1CPERCNT 5.2 05/31/2021     Recent Labs   Lab 05/31/21  0356 05/30/21  1910   Glucose 92  --    Sodium 141  --    Potassium 3.7  --    Chloride 113*  --    CO2 21  --    BUN 10  --    Creatinine 0.81  --    Creatinine I-Stat  --  0.90   EGFR 83 73   Calcium 8.4*  --                Invalid input(s):  AMORPHOUSUA   Patient Lines/Drains/Airways Status       Active PICC Line / CVC Line / PIV Line / Drain / Airway / Intraosseous Line / Epidural Line / ART Line / Line / Wound / Pressure Ulcer / NG/OG Tube       Name Placement date Placement time Site Days    Peripheral IV 05/30/21 20 G Left Antecubital 05/30/21  1906  Antecubital  less than 1                   No results found.   Home Health Needs:  There are no questions and answers to display.       Nutrition assessment done in collaboration with Registered Dietitians:     Time spent:      Obie Dredge, MD     05/31/21,8:46 AM   MRN: [id number]                                      CSN: [id number] DOB: [birth year]

## 2021-05-31 NOTE — Plan of Care (Signed)
NURSE NOTE SUMMARY  [facility] [address]   Patient Name: Laura Mclaughlin   Attending Physician: Obie Dredge, *   Today's date:   05/31/2021 LOS: 0 days   Shift Summary:                                                              Patient exhausted with little appetite. Slept well no incidences observed. Patient not " acting like self" from daughter, daughter pressed seizure button once as she said her Mom told her to, no signs of activity were noted. Blood pressure runs soft, NS at 75/hr. On VEEG, daughter in room.     Provider Notifications:        Rapid Response Notifications:  Mobility:      PMP Activity: Step 6 - Walks in Room (05/31/2021 10:00 AM)     Weight tracking:  Family Dynamic:   Last 3 Weights for the past 72 hrs (Last 3 readings):   Weight   05/30/21 2318 113.4 kg (250 lb)   05/30/21 1906 113.4 kg (250 lb)             Last Bowel Movement   Last BM Date: 05/30/21        Problem: Neurological Deficit  Goal: Neurological status is stable or improving  Description: Interventions:  1. Monitor/assess/document neurological assessment (Stroke: every 4 hours)  2. Monitor/assess NIH Stroke Scale   3. Re-assess NIH Stroke Scale for any change in status  4. Observe for seizure activity and initiate seizure precautions if indicated  5. Perform CAM Assessment  Flowsheets (Taken 05/30/2021 2350 by Jon Billings, RN)  Neurological status is stable or improving: Observe for seizure activity and initiate seizure precautions if indicated     Problem: Moderate/High Fall Risk Score >5  Description: Fall Risk Score > 5  Goal: Patient will remain free of falls  Flowsheets (Taken 05/31/2021 1000)  VH High Risk (Greater than 13): RED "HIGH FALL RISK" SIGNAGE

## 2021-05-31 NOTE — UM Notes (Signed)
DOS 05/30/21  Obs     ED d/t seizure activity, started Biviact the day before d/t increased seizure activity, pt lost consciousness today with non convulsive seizure activity, confused post.      Assessment & Plan:    Increasing frequency of seizure activity  Known epilepsy  Resume briviact, xcopri, topamax  Discussed with neurology  Video EEG monitoring  Neuro check  Seizure precaution     Hypokalemia 3.4  Replace      DOS 05/31/21  Obs     Neurology consult:   seizure activity  Diagnostics:  Video EEG pending  Telemetry  Vitals and neuro checks q 4 hrs  Labs:  H&H 10.9 and 33 respectively  Potassium 3.4  HbA1c 5.2%  Thyroid function pending  Meds:  Home medications include:  Valium 10 mg nightly  Topamax 150 mg BID  Xcopri per titration schedule  Briviact 50 mg BID  As needed Ativan for seizure activity lasting more than 3 minutes or more than 1 seizure in 1 hour  Activity:  As tolerated  Fall/seizure/aspiration precautions  No driving for six months from time of last seizure  Rehab:         PT/OT/SLP if medically indicated    Tx:   IV NS 75 ml/hr   TSH in am   Tele   VS every 4 hours     Andreas Blower, RN, BSN  [facility]   Utilization Management  Phone:  [phone]  Fax:  [phone]

## 2021-05-31 NOTE — Nursing Progress Note (Signed)
Pt AAOx4. Slightly drowsy this shift. Q4 neuro checks stable and maintained. Seizure precautions in place. No seizures witnessed or reported by pt/dtr this shift. Fluids started. VSS. Call bell within reach. Bed alarm on. Daughter at bedside

## 2021-05-31 NOTE — Consults (Signed)
[facility] Patient    NEUROLOGY CONSULT NOTE    Date / Time: 05/31/21 8:17 AM  Patient Name: Laura Mclaughlin  Attending Physician: Obie Dredge, *    ASSESSMENT AND PLAN:   Laura Mclaughlin, who presents with recurrent seizure-like activity over the past 7 to 10 days.  History of seizures on Xcopri, diazepam, Topamax and most recently started on Briviact on 7/26. Followed by a neurologist in [facility]; have requested medical records from Dr. Jeannie Done.     Video EEG in progress to help guide further work-up and treatment recommendations.    Recurrent seizure activity  Diagnostics:  Video EEG pending  Telemetry  Vitals and neuro checks q 4 hrs  Labs:  H&H 10.9 and 33 respectively  Potassium 3.4  HbA1c 5.2%  Thyroid function pending  Meds:  Home medications include:  Valium 10 mg nightly  Topamax 150 mg BID  Xcopri per titration schedule  Briviact 50 mg BID  As needed Ativan for seizure activity lasting more than 3 minutes or more than 1 seizure in 1 hour  Activity:  As tolerated  Fall/seizure/aspiration precautions  No driving for six months from time of last seizure  Rehab:   PT/OT/SLP if medically indicated    CHIEF COMPLAINT:   Recurrent seizure activity    HISTORY OF PRESENT ILLNESS:   Laura Mclaughlin ION:[id number], MRN: [id number], is a 60 y.o. female, with medical conditions that include: Seizures, lupus, and hypothyroidism, on whom I have been asked to consult on for recurrent seizure-like activity.    Patient presents to the ER via EMS after she became unresponsive; concern for non-convulsive seizure activity. History of seizures since childhood.     Patient reports that she is certain she had a seizure last Monday as her house is being renovated and she did not go outside to speak to the workers any on that day despite being home all day. She arrived at her daughters house on Saturday and was "dazed.' Though it was a planned trip, her daughter did not realize she was still going to come as  she knew about the "seizure early in the week." Daughter states that she almost immediately went to bed. Daughter notes that throughout the night on Saturday she was "making gurgling and grunting noises." On Sunday she seemed a bit better. On the day of presentation she became unresponsive while riding in the car with her daughter. Daughter states there was no visible evidence of shaking or tremors. Following the episode of unresponsiveness she was "slurring her words and acting like she had a little to much to drink." She was confused. Daughter notified her dad who requested that she be sent to the hospital.    Patient reports that she was has been followed by a neurologist for many years though hers did recently move to [place] and she is now followed by someone who just "stepped in." She states that she has tried Keppra in the past and it made her "so angry." She was started on Briviact on the day of presentation and only had the opportunity to take one 50 mg tablet. Patient requests that we obtain her medical records from her neurology office.     REVIEW OF SYSTEMS:   A complete ROS was found to be negative except that which is detailed above in the HPI and as follows:     CURRENT PROBLEM LIST:     Active Hospital Problems    Diagnosis    Increasing  frequency of seizure activity       PAST MEDICAL HISTORY:     Past Medical History:   Diagnosis Date    Convulsions     Disorder of thyroid     Lupus        FAMILY HISTORY:   History reviewed. No pertinent family history.     SOCIAL HISTORY:     Social History     Socioeconomic History    Marital status: Married     Spouse name: None    Number of children: None    Years of education: None    Highest education level: None   Occupational History    None   Tobacco Use    Smoking status: Never    Smokeless tobacco: Never   Vaping Use    Vaping Use: Never used   Substance and Sexual Activity    Alcohol use: Never    Drug use: Never    Sexual activity: None   Other Topics  Concern    None   Social History Narrative    None     Social Determinants of Health     Financial Resource Strain: Not on file   Food Insecurity: Not on file   Transportation Needs: Not on file   Physical Activity: Not on file   Stress: Not on file   Social Connections: Not on file   Intimate Partner Violence: Not on file   Housing Stability: Not on file       ALLERGIES:     Allergies   Allergen Reactions    Dilaudid [Hydromorphone] Irregular Heart Rate    Reglan [Metoclopramide] Other (See Comments)     Tardive dyskinesia       MEDICATIONS:     Prior to Admission medications    Medication Sig Start Date End Date Taking? Authorizing Provider   Brivaracetam (Briviact) 50 MG Tab tablet Take 50 mg by mouth every 12 (twelve) hours   Yes [provider]   Cenobamate (Xcopri) 14 x 150 MG & 14 x200 MG Tablet Therapy Pack Take by mouth   Yes [provider]   diazePAM (VALIUM) 5 MG tablet Take 10 mg by mouth nightly 05/01/21  Yes [provider]   levothyroxine (SYNTHROID) 75 MCG tablet Take 75 mcg by mouth Once a day at 6:00am   Yes [provider]   modafinil (PROVIGIL) 200 MG tablet Take 200 mg by mouth daily   Yes [provider]   pantoprazole (PROTONIX) 40 MG tablet Take 40 mg by mouth daily   Yes [provider]   pramipexole (MIRAPEX) 0.75 MG tablet Take 0.75 mg by mouth nightly 04/28/21  Yes [provider]   topiramate (TOPAMAX) 50 MG tablet Take 50 mg by mouth 2 (two) times daily   Yes [provider]       Current Facility-Administered Medications   Medication Dose Route Frequency    Brivaracetam  50 mg Oral Q12H SCH    enoxaparin  40 mg Subcutaneous Q24H    folic acid  1 mg Oral Daily    levothyroxine  75 mcg Oral Daily at 0600    modafinil  200 mg Oral Daily    multivitamin  1 tablet Oral Daily    pantoprazole  40 mg Oral QAM AC    NON-FORMULARY PAT OWN MED order form  1 tablet Oral Daily    pramipexole  0.75 mg Oral QHS    sodium chloride  (PF)  3 mL Intravenous Q12H SCH    sodium chloride (PF)  3 mL Intravenous Q12H SCH    topiramate  50 mg Oral BID    vitamin B-1  100 mg Oral Daily       sodium chloride, Last Rate: 75 mL/hr at 05/31/21 0032        PHYSICAL EXAM:     Vitals:    05/31/21 0724   BP: 104/66   Pulse: 73   Resp: 16   Temp: 97.2 F (36.2 C)   SpO2: 99%       General: Patient is a 60 year old female, in no acute distress. Tearful at times  Mental status: Awake, alert, and oriented to all spheres with fluent speech (delay in naming the POTUS)and appropriate affect. Recent and remote memory intact.  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  CN 7 Spontaneous facial expressions are symmetrical.  CN 8 Auditory acuity intact bilaterally to casual conversation.  CN 9 Palate rises with phonation.  CN 11 Trapezius and sternocleidomastoids intact.  CN 12 Tongue protrudes in midline.  Motor: Power is 5/5 in all major muscle groups with normal tone and bulk for age. No drift, dystonia or tremors appreciated.   Sensory:  Sensation to light touch is intact throughout.   Coordination: Rapid alternating movements, brisk, coordinated and symmetric.   DTRs: 2+ and symmetric bilaterally  Gait: not assessed    HEENT:  Head is midline, normocephalic, atraumatic. EEG leads intact  Cardiac:  Patient in sinus rhythm on tele monitoring.  Chest:  Breath sounds CTA bilaterally.   Extremities:  No deformities, cyanosis, clubbing or edema. No new rash or bruise.    SIGNIFICANT LABS:     Results       Procedure Component Value Units Date/Time    Basic Metabolic Panel [[id number]]  (Abnormal) Collected: 05/31/21 0356    Specimen: Plasma Updated: 05/31/21 0445     Sodium 141 mMol/L      Potassium 3.7 mMol/L      Chloride 113 mMol/L      CO2 21 mMol/L      Calcium 8.4 mg/dL      Glucose 92 mg/dL      Creatinine 4.09 mg/dL       BUN 10 mg/dL      Anion Gap 81.1 mMol/L      BUN / Creatinine Ratio 12.3 Ratio      EGFR 83 mL/min/1.6m2      Osmolality Calculated 280 mOsm/kg     Hemoglobin A1C [[id number]] Collected: 05/31/21 0356    Specimen: Blood Updated: 05/31/21 0435     Hgb A1C, % 5.2 %      Estimated Average Glucose 103 mg/dL     CBC and differential [[id number]]  (Abnormal) Collected: 05/31/21 0356    Specimen: Blood Updated: 05/31/21 0423     WBC 6.5 K/cmm      RBC 3.53 M/cmm      Hemoglobin 10.9 gm/dL      Hematocrit 46.9 %      MCV 94 fL      MCH 31 pg      MCHC 33 gm/dL      RDW 62.9 %      PLT CT 246 K/cmm      MPV 7.9 fL      Neutrophils %  63.6 %      Lymphocytes 27.1 %      Monocytes 4.6 %      Eosinophils % 4.3 %      Basophils % 0.4 %      Neutrophils Absolute 4.2 K/cmm      Lymphocytes Absolute 1.8 K/cmm      Monocytes Absolute 0.3 K/cmm      Eosinophils Absolute 0.3 K/cmm      Basophils Absolute 0.0 K/cmm     i-Stat Chem 8 CartrIDge [[id number]]  (Abnormal) Collected: 05/30/21 1910    Specimen: Blood Updated: 05/30/21 1914     Sodium I-Stat 142 mMol/L      Potassium I-Stat 3.4 mMol/L      Chloride I-Stat 107 mMol/L      TCO2 I-Stat 24 mMol/L      Calcium Ionized I-Stat 4.90 mg/dL      Glucose I-Stat 409 mg/dL      Creatinine I-Stat 0.90 mg/dL      BUN I-Stat 11 mg/dL      Anion Gap I-Stat 81.1     EGFR 73 mL/min/1.62m2      Hematocrit I-Stat 37.0 %      Hemoglobin I-Stat 12.6 gm/dL     Chem 8 plus only (i-STAT) [[id number]] Collected: 05/30/21 1900    Specimen: ISTAT Updated: 05/30/21 1906     I-STAT Notification Istat Notification            SIGNIFICANT IMAGING:     No orders to display             Case discussed with Dr. Cheryl Flash who will additionally review diagnostics/labs, personally evaluate the patient, further determine the treatment plan, and amend this note as indicated. Thank you for the consultation.    Ala Bent, NP  05/31/2021  8:17 AM

## 2021-06-01 DIAGNOSIS — R569 Unspecified convulsions: Secondary | ICD-10-CM | POA: Diagnosis present

## 2021-06-01 LAB — THYROID STIMULATING HORMONE (TSH), REFLEX ON ABNORMAL TO FREE T4, SERUM: TSH: 0.77 u[IU]/mL (ref 0.40–4.20)

## 2021-06-01 LAB — VH DEXTROSE STICK GLUCOSE: Glucose POCT: 108 mg/dL — ABNORMAL HIGH (ref 71–99)

## 2021-06-01 MED ORDER — SODIUM CHLORIDE 0.9 % IV SOLN
INTRAVENOUS | Status: AC
Start: 2021-06-01 — End: 2021-06-02

## 2021-06-01 MED ORDER — VENLAFAXINE HCL ER 75 MG PO CP24
75.0000 mg | ORAL_CAPSULE | Freq: Every morning | ORAL | Status: DC
Start: 2021-06-01 — End: 2021-06-02
  Administered 2021-06-01 – 2021-06-02 (×2): 75 mg via ORAL
  Filled 2021-06-01 (×2): qty 1

## 2021-06-01 NOTE — Plan of Care (Addendum)
NURSE NOTE SUMMARY  [facility] [address]   Patient Name: Laura Mclaughlin   Attending Physician: Obie Dredge, *   Today's date:   06/01/2021 LOS: 0 days   Shift Summary:                                                              Pt AAOx4. Q4 neuro checks maintained. Up x1 to [facility]. UA collected and sent to lab per request of pt. VEEG intact, no seizure activity witnessed or reported by pt/dtr. VSS.     0100 - Dtr reported that pt was experiencing new onset of blurry, double vision. Upon assessment pt stated that this situation has only happened once before while driving recently and felt it could be r/t seizures. Pt also felt like the "room was spinning" when she tried to lay down. Pt slightly slurring words and appears fatigued. VSS. BG stable. Neuro assessment stable and unchanged, charted in flowsheet. Charge RN notified, assessed at bedside. RR notified, assessed at bedside.     0130 - Pt stated while RR @ bedside that symptoms are resolving.      Provider Notifications:        Rapid Response Notifications:  Mobility:      PMP Activity: Step 5 - Chair (05/31/2021  8:00 PM)     Weight tracking:  Family Dynamic:   Last 3 Weights for the past 72 hrs (Last 3 readings):   Weight   05/30/21 2318 113.4 kg (250 lb)   05/30/21 1906 113.4 kg (250 lb)             Last Bowel Movement   Last BM Date: 05/30/21        Problem: Neurological Deficit  Goal: Neurological status is stable or improving  Outcome: Progressing     Problem: Moderate/High Fall Risk Score >5  Goal: Patient will remain free of falls  Outcome: Progressing

## 2021-06-01 NOTE — UM Notes (Signed)
DOS 06/01/21  Obs     EEG:  Normal cEEG without focal or epileptiform abnormalities. An event marked at 12:50 (description not available and no evidence of clinical changes from video-review) an an event of blurry/double vision and room spinning with slurring of speech around showed no change in the EEG background and no evidence of seizure activity.    Neurology following   Bertram Denver, who presents with recurrent seizure-like activity over the past 7 to 10 days.  History of seizures on Xcopri, diazepam, Topamax and most recently started on Briviact on 7/26. Followed by a neurologist in [facility]; office records in bedside chart.     Video EEG in progress; event captured at 12:50 consisting of blurry/double vision, room spinning and slurring of speech was NOT associated with EEG changes. Very lengthy discussion regarding the [place] law regarding no driving for six months from time of last seizure. She states that she is aware of the law but continues to drive in spite of it. Social Worker at the bedside with me; she will inquire with DMV how to proceed. Discussed the case with Baruch Gouty, from behavioral health and they will see her on consultation.      Recurrent seizure activity  Diagnostics:  Video EEG pending  Telemetry  Vitals and neuro checks q 4 hrs  Labs:  H&H 10.9 and 33 respectively  Potassium 3.4  HbA1c 5.2%  Thyroid function normal  UA: +LE  Meds:  Home medications include:  Valium 10 mg nightly  Topamax 150 mg BID  Xcopri per titration schedule  Briviact 50 mg BID  As needed Ativan for seizure activity lasting more than 3 minutes or more than 1 seizure in 1 hour  Activity:  As tolerated  Fall/seizure/aspiration precautions  No driving for six months from time of last seizure  Rehab:         PT/OT/SLP if medically indicated    Medical Assessment & Plan:    Increasing frequency of seizure-like activity  History of epilepsy  --Video EEG monitoring in progress  --Patient reports some neuro symptoms  including blurring/double vision and some vertigo symptoms slurring of speech however notes correlated with EEG changes ,as per neurologist's report.  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  Patient continues driving despite this recurrent seizure-like activities.  She was given instruction and advised not to drive motor vehicle per [place] DMV regulation for 6 months.   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  Keep in mind any non-pharmacological interventions such as frequent orientation, minimize sleep disturbances, redirection, maintaining a low stimulus environment.      TREATMENT RECOMMENDATIONS with indications: Resume Effexor 75 mg daily. Avoid deliriants if possible that may worsen confusion/MSE.Marland Kitchen      DISPOSITION: Based on current assessment, no clear indication for Acute BHS IP at this time. Patient will have to follow up with their OP  for future psychiatric medication changes or refills after .     Tx:   IV NS 75 ml/hr   Neuro checks every 4 hours   VS every 4 hours  Tele     Andreas Blower, RN, BSN  [facility]   Utilization Management  Phone:  [phone]  Fax:  [phone]
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
## 2021-06-01 NOTE — UM Notes (Signed)
Laura Mclaughlin  1961/09/23  Ref:  [id number]      This is a failed observation.    Admitted obs 05/30/21  Converted to IP 06/01/21      DOS 05/30/21  Obs      ED d/t seizure activity, started Biviact the day before d/t increased seizure activity, pt lost consciousness today with non convulsive seizure activity, confused post.       Assessment & Plan:    Increasing frequency of seizure activity  Known epilepsy  Resume briviact, xcopri, topamax  Discussed with neurology  Video EEG monitoring  Neuro check  Seizure precaution     Hypokalemia 3.4  Replace        DOS 05/31/21  Obs      Neurology consult:   seizure activity  Diagnostics:  Video EEG pending  Telemetry  Vitals and neuro checks q 4 hrs  Labs:  H&H 10.9 and 33 respectively  Potassium 3.4  HbA1c 5.2%  Thyroid function pending  Meds:  Home medications include:  Valium 10 mg nightly  Topamax 150 mg BID  Xcopri per titration schedule  Briviact 50 mg BID  As needed Ativan for seizure activity lasting more than 3 minutes or more than 1 seizure in 1 hour  Activity:  As tolerated  Fall/seizure/aspiration precautions  No driving for six months from time of last seizure  Rehab:         PT/OT/SLP if medically indicated     Tx:   IV NS 75 ml/hr   TSH in am   Tele   VS every 4 hours       DOS 06/01/21  Inpatient        EEG:  Normal cEEG without focal or epileptiform abnormalities. An event marked at 12:50 (description not available and no evidence of clinical changes from video-review) an an event of blurry/double vision and room spinning with slurring of speech around showed no change in the EEG background and no evidence of seizure activity.     Neurology following   Laura Mclaughlin, who presents with recurrent seizure-like activity over the past 7 to 10 days.  History of seizures on Xcopri, diazepam, Topamax and most recently started on Briviact on 7/26. Followed by a neurologist in [facility]; office records in bedside chart.     Video EEG in progress; event  captured at 12:50 consisting of blurry/double vision, room spinning and slurring of speech was NOT associated with EEG changes. Very lengthy discussion regarding the [place] law regarding no driving for six months from time of last seizure. She states that she is aware of the law but continues to drive in spite of it. Social Worker at the bedside with me; she will inquire with DMV how to proceed. Discussed the case with Baruch Gouty, from behavioral health and they will see her on consultation.      Recurrent seizure activity  Diagnostics:  Video EEG pending  Telemetry  Vitals and neuro checks q 4 hrs  Labs:  H&H 10.9 and 33 respectively  Potassium 3.4  HbA1c 5.2%  Thyroid function normal  UA: +LE  Meds:  Home medications include:  Valium 10 mg nightly  Topamax 150 mg BID  Xcopri per titration schedule  Briviact 50 mg BID  As needed Ativan for seizure activity lasting more than 3 minutes or more than 1 seizure in 1 hour  Activity:  As tolerated  Fall/seizure/aspiration precautions  No driving for six months from time of last  seizure  Rehab:         PT/OT/SLP if medically indicated     Medical Assessment & Plan:    Increasing frequency of seizure-like activity  History of epilepsy  --Video EEG monitoring in progress  --Patient reports some neuro symptoms including blurring/double vision and some vertigo symptoms slurring of speech however notes correlated with EEG changes ,as per neurologist's report.  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  Patient continues driving despite this recurrent seizure-like activities.  She was given instruction and advised not to drive motor vehicle per [place] DMV regulation for 6 months.   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  Keep in mind any non-pharmacological interventions such as frequent orientation, minimize sleep disturbances, redirection, maintaining a low stimulus environment.      TREATMENT RECOMMENDATIONS with indications: Resume Effexor 75 mg daily. Avoid deliriants if possible that may worsen confusion/MSE.Marland Kitchen      DISPOSITION: Based on current assessment, no clear indication for Acute BHS IP at this time. Patient will have to follow up with their OP for future psychiatric medication changes or refills after [place].      Tx:   IV NS 75 ml/hr   Neuro checks every 4 hours   VS every 4 hours  Tele         Andreas Blower, RN, BSN  [facility]   Utilization Management  Phone:  [phone]  Fax:  [phone]

## 2021-06-01 NOTE — Procedures (Addendum)
PROCEDURE:  Continuous Video-EEG Monitoring (12-26 hours)    HISTORY: The patient is a 60 year old woman with a history of seizures who presented with increased frequency of seizures.   Continuous EEG monitoring was performed to clarify diagnosis and evaluate for subclinical seizures.     DESCRIPTION:  The EEG was monitored continuously with concomitant digital video recording.      FINDINGS:  DAY 1: Continuous video-EEG recording was performed from 05/31/2021 at 09:18 through 06/01/2021 at 09:20 (Duration 23 hours 58 minutes)    INTERICTAL RECORD  The waking background shows appropriate organization with clearly defined anterior-posterior voltage and frequency gradients.  There is a well-defined posterior dominant rhythm of 9 Hertz, which is symmetrical and shows normal reactivity.  Anteriorly, there is the expected pattern of lower voltage and more irregular polymorphic faster frequencies.      The sleep background is appropriately organized with well-developed spindles and vertex waves indicative of stage NR2 sleep.  These sleep transients show appropriate morphology and are bilaterally synchronous and symmetrical.     Throughout this portion of the monitoring session there were no epileptiform discharges, paroxysmal features, focal abnormalities or significant interhemispheric asymmetries.    EVENT RECORD:  The patient button was activated at 12:50 pm. Unclear from video what was being indicated (no audio).  The EEG showed no changes.    01:00 - No event button - patient reported to nursing that she had a sensation of blurry vision, fatigue, slurred speech. There were no EEG changes.  INTERIM IMPRESSION:  Normal cEEG without focal or epileptiform abnormalities. An event marked at 12:50 (description not available and no evidence of clinical changes from video-review) an an event of blurry/double vision and room spinning with slurring of speech around 01:00 showed no change in the EEG background and no evidence of  seizure activity.

## 2021-06-01 NOTE — Progress Notes (Signed)
Readmission Risk  [facility] [address]   Patient Name: Laura Mclaughlin   Attending Physician: Obie Dredge, *   Today's date:   06/01/2021 LOS: 0 days   Expected Discharge Date      Readmission Assessment:                                                              Discharge Planning  Confirmed PCP with Pt: No  Confirm Transport to F/U Appt.: Self/Private Vehicle/Friend  Anticipated Home Health at [place]: No  Anticipated Placement at [place]: No  CM Comments: 06/01/21 SW MBM Increased seizure activity, VEEG, psych consult?  PTA Pt lives w/ spouse is from [facility] and was visiting family(children) in [place] area. Family can transport at [place]. Pt admits to driving recently and having difficulty in doing so (seeing double), DMV will need to be notified. Clinical Course ongoing, SW will continue to follow.       IDPA:      Healthcare Decisions  Interviewed:: Patient, Family  Orientation/Decision Making Abilities of Patient: Alert and Oriented x3, able to make decisions  Advance Directive: Patient does not have advance directive  Healthcare Agent Appointed: No  Prior to admission  Prior level of function: Independent with ADLs, Ambulates independently  Type of Residence: Private residence  Home Layout: Able to live on main level with bedroom/bathroom  Have running water, electricity, heat, etc?: Yes  Living Arrangements: Spouse/significant other  How do you get to your MD appointments?: Spouse/Self  How do you get your groceries?: Spouse/Self  Who fixes your meals?: Spouse/Self  Who does your laundry?: Spouse/Self  Who picks up your prescriptions?: Spouse/Self  Dressing: Independent  Grooming: Independent  Feeding: Independent  Bathing: Independent  Toileting: Independent  Discharge Planning  Support Systems: Spouse/significant other, Children  Patient expects to be discharged to:: Home  Anticipated Villas plan discussed with:: Same as interviewed  Mode of transportation:: Private car (family  member)  Consults/Providers  PT Evaluation Needed: No  OT Evalulation Needed: No  SLP Evaluation Needed: No  Family and PCP  PCP - first name: ROY  PCP - middle name: THOMAS  PCP - last name: BOWDEN  PCP - city: [place]  PCP - state: [place]  PCP - phone : [phone]  PCP - NPI: [id number]  PCP - FAX: [phone]   30 Day Readmission:       Provider Notifications:        Fanny Dance, MSW  [phone]

## 2021-06-01 NOTE — Plan of Care (Signed)
.   I spoke to patient again just now. She revisited her decision, talked to her husband for driving support and is now willing to stop driving until she is seizure free x 6 months and only with neurologist's clearance. She agreed to cooperate with recommendations    Recommend to obtain clearance for driving also from her ophthalmologist given her known duane retraction syndrome causing chronic diplopia    D/w RN,   D/w daughter  Notified Child psychotherapist

## 2021-06-01 NOTE — Consults (Signed)
Psychiatry C/L Service    Laura Mclaughlin, 60 y.o. female  Date of Birth:  Sep 16, 1961    Consultant: Zena Amos, NP 06/01/2021 9:29 AM    Attending requesting consult: Neurology    Reason for Consult: non-epileptic seizures. Continues to drive despite knowning the law of no driving for six months from time of last seizure    Identifying information: Patient is a 60 year old female with history of her disorder, hypothyroidism, lupus, gastric bypass and COVID (January 2022) who was admitted on 05-30-2021 1 daughter called EMS after patient became unresponsive with concern for seizure activity.  However, EEG activity has been negative for seizure activity.    CC: " I have had a lot of stress for years".      HPI: Per notes by Collene Mares, NP for neurology, Patient reports that she is certain she had a seizure last Monday as her house is being renovated and she did not go outside to speak to the workers any on that day despite being home all day. She arrived at her daughters house on Saturday and was "dazed.' Though it was a planned trip, her daughter did not realize she was still going to come as she knew about the "seizure early in the week." Daughter states that she almost immediately went to bed. Daughter notes that throughout the night on Saturday she was "making gurgling and grunting noises." On Sunday she seemed a bit better. On the day of presentation she became unresponsive while riding in the car with her daughter. Daughter states there was no visible evidence of shaking or tremors. Following the episode of unresponsiveness she was "slurring her words and acting like she had a little to much to drink." She was confused. Daughter notified her dad who requested that she be sent to the hospital.  Patient reports that she was has been followed by a neurologist for many years though hers did recently move to [place] and she is now followed by someone who just "stepped in." She states that she has tried Keppra in  the past and it made her "so angry." She was started on Briviact on the day of presentation and only had the opportunity to take one 50 mg tablet.     When seen by psychiatry this morning, the patient was unaware of psychiatry consult but agreeable to evaluation.  Her daughter was also present in the room and provided collateral.    The patient is vague, often concrete in thought with some mild infrequent slurring of speech at times and word-finding difficulty.  She endorses a history of anxiety/depressed mood and has been seeing an outpatient psychiatrist in [place] for approximately 5 years.  She had a brief trial of low-dose Wellbutrin which was discontinued due to increased risk for seizures.  She is vague regarding timelines but indicates she has been taking Effexor 75 mg daily for the past year.  Sees her psychiatrist by telepsych visits every 3 months.  She also indicates she was recently started on modafinil due to Xcorpi causing fatigue.  She does not take this on a routine basis but estimates taking it twice a week.    The patient minimizes current depressive and anxious symptoms.  She is vague regarding symptoms that led to her seeing a psychiatrist.  She does endorse a history of numerous stressors listing off: My daughter has significant health problems, my problems, my father had a CVA; I have had a lot of stress over the years.  She initially listed these stressors as though they were all recent or ongoing.  However when asked very specifically when each event happened, they were a number of years apart and her father CVA was many years ago.    The patient denies any change in level of interest in normal activities but goes on to explain again all of her stressors and the busyness related to these stressors preventing her from engaging in some of her activities.  The patient endorses difficulty with sleep "always" since childhood.  She endorses possibly getting 5 to 6 hours of sleep at night.   She denies frequent naps.  She has a tendency to "stress eat".  She denies any previous or current active or passive suicidal ideation.    The patient identifies anxiety more so than depression.  She is noted to become easily frustrated with numerous evaluation questions.  She does endorse tenseness and worry.  She believes her Effexor has helped her to feel calmer.  Although she denied any new stressors, she seems to become anxious discussing her new neurologist and concerns that that neurologist will not be there long.  She wants to have VNS placement surgery and to get off of all/as much of her seizure medications.  The daughter also offers that the patient misses her family members.  She has 2 daughters who have moved out of the area and the patient and her husband have not been looking for a new church.  These things have been stressful for the patient.    The patient denies any history of manic symptoms.  She denies any psychotic experiences, abuse/traumatic experiences or substance abuse.    Pertinent Psychiatric/Substance History:   Inpatient Admission: Denies.  OP treatment: As noted above.  Suicide attempts/SIB: Denies.  Previous Medications: Wellbutrin, Effexor, as noted above.    Substance: Denies.    Past medical: Reviewed  Allergies: Reviewed  Current meds: Reviewed    Pertinent Family: MTR: Denies.  FTR: Denies.  Siblings: Denies.     Social:   Resides: With her husband of 69 years [place], [place].  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  Currently working part-time for [employer] and part-time with her own crafting business.  Relationship Status: Good relationships with husband and children.  Has 4 brothers and grandchildren  History of Abuse/trauma: no    MSE:         Appearance: appears younger than stated age, good/fair eye contact through interview, acceptable hygiene, and dressed in hospital attire  Mood:  "I'm doing ok"  Affect:  Pleasant  and cooperative and Anxious discussing stressors  Thought Process:  Perseverating/Circumstantial  Thought Content:  No Suicidal thoughts, intent or plan, No Homicidal thoughts, intent or plan, No delusional thought content, No preoccupation with situation evident, and Future orientation, positive outlook  Perceptual Disturbance:  Denies auditory, tacile, olfactory, visual hallucination at this time  Insight:  Fair  Judgment: Fair  Cognition:   Alert, Oriented to time, place and person, Registration good, and Attention Good, Attentive through interview, able to concentrate on task    A:   Patient is currently having symptoms consistent with anxiety, NOS.  Rule out psychogenic seizures.  Patient has a history of anxiety and possible depression.     The patient is being treated by an outpatient psychiatrist who is prescribing Effexor and modafinil.  Discussed and agreed to resume her Effexor.  She has been utilizing modafinil only on a as  needed basis in response to side effects from her antiepileptics.    We discussed the possibility of psychogenic seizures which were explained to the patient and daughter.  It seems the patient becomes quite anxious discussing having a new neurologist who may not continue to practice there for any specific length of time.  The patient is interested in VNS implant and worried about needing to find a new neurologist/neurosurgeon.    Also discussed with patient's the importance of and legal aspects regarding driving when she has been having seizures.  Review of other outpatient records indicate CT of the brain on 09-15-2020 at [facility] shows mild frontal lobe atrophy bilaterally.  This could possibly explain some of the patient's seeming lack of concern about this.  However, she does comment that she takes it "seriously" begins offering a number of excuses.    The patient is encouraged to make a appointment with her psychiatrist for the near future and encouraged psychotherapy to help  her manage her stressors.    P:    MEDICAL: Please continue to address the underlying medical/surgical condition for admission.  Keep in mind any non-pharmacological interventions such as frequent orientation, minimize sleep disturbances, redirection, maintaining a low stimulus environment.     TREATMENT RECOMMENDATIONS with indications: Resume Effexor 75 mg daily. Avoid deliriants if possible that may worsen confusion/MSE.Marland Kitchen     DISPOSITION: Based on current assessment, no clear indication for Acute BHS IP at this time. Patient will have to follow up with their OP for future psychiatric medication changes or refills after [place].     Zena Amos, NP  06/01/2021 9:29 AM    Psychiatry [phone] during daytime hours or [phone] for Behavioral Health Intake 24/7.  During weekend/holiday coverage, Web on Call for emergency consults only.     Will sign off for now.

## 2021-06-01 NOTE — Progress Notes (Signed)
Medicine Progress Note   [facility]  [employer]   Patient Name: [name], [name] LOS: 0 days   Attending Physician: Obie Dredge, * PCP: Regis Bill, [facility] Course:                                                            MIKEA Mclaughlin is a 60 y.o. female patient  with history of hypothyroidism, SLE and epilepsy who presented with seizure activity.  It was reported the patient had recurring seizure this month.  Followed by a neurologist Dr. Jeannie Done at  [facility] and takes Xcopri, Topamax and diazepam.  Because of increased frequency of seizures she was prescribed Briviact the day prior to admission.  In the afternoon of her day of presentation,  while patient was in the care with her daughter, she lost consciousness. Daughter reports nonconvulsive seizure activity.  The patient afterwards was noted to be confused and later regained her baseline mental status.  This made her come to ER for further evaluation.  They have been in touch with their neurologist who recommended a 48-hour video EEG monitoring.    Video EG monitoring in progress no correlation of her symptoms with EEG;  no epileptiform activity reported   Assessment and Plan:    Increasing frequency of seizure-like activity  History of epilepsy  --Video EEG monitoring in progress  --Patient reports some neuro symptoms including blurring/double vision and some vertigo symptoms slurring of speech however notes correlated with EEG changes ,as per neurologist's report.  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  Patient continues driving despite this recurrent seizure-like activities.  She was given instruction and advised not to drive motor vehicle per [place] DMV regulation for 6 months.   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   DVT PPX: Medication VTE Prophylaxis Orders: enoxaparin (LOVENOX) syringe 40 mg  Mechanical VTE Prophylaxis Orders: Mechanical VTE: Pneumatic Compression; Knee high  Code:  Full Code     Subjective   Patient has no new complaint this time.        Objective   Physical Exam:     Vitals: T:(!) 96.6 F (35.9 C) (Temporal), BP:115/65, HR:75, RR:18, SaO2:98%      General: Patient is awake. In no acute distress.  HEENT: No conjunctival drainage, vision is intact, anicteric sclera.  Neck: Supple, no thyromegaly.  Chest: CTA bilaterally. No rhonchi, no wheezing. No use of accessory muscles.  CVS: Normal rate and regular rhythm no murmurs, without JVD, no pitting edema, pulses palpable.  Abdomen: Soft, non-tender, no guarding or rigidity, with normal bowel sounds.  Extremities: No calf swelling and no gross deformity.  Skin: Warm, dry, no rash and no worrisome lesions.  NEURO: No motor or sensory deficits.  Psychiatric: Alert, interactive, appropriate, normal affect.    The above is as  examined by Dr. Eustaquio Boyden on  06/01/2021    Weight Monitoring 05/30/2021 05/30/2021   Height 175.3 cm 175.3 cm   Height Method Stated -   Weight 113.399 kg 113.4 kg   Weight Method Stated Standing Scale   BMI (calculated) 37 kg/m2 37 kg/m2           Intake/Output Summary (Last 24 hours) at 06/01/2021 0820  Last data filed at 06/01/2021 0306  Gross per 24 hour   Intake 3044.03 ml   Output --   Net 3044.03 ml       Body mass index is 36.9 kg/m.     Meds:     Current Facility-Administered Medications   Medication Dose Route Frequency    Brivaracetam  50 mg Oral Q12H SCH    diazePAM  10 mg Oral QHS    enoxaparin   40 mg Subcutaneous Q24H    folic acid  1 mg Oral Daily    levothyroxine  75 mcg Oral Daily at 0600    modafinil  200 mg Oral Daily    multivitamin  1 tablet Oral Daily    pantoprazole  40 mg Oral QAM AC    NON-FORMULARY PAT OWN MED order form  1 tablet Oral Daily    pramipexole  0.75 mg Oral QHS    sodium chloride (PF)  3 mL Intravenous Q12H SCH    sodium chloride (PF)  3 mL Intravenous Q12H SCH    topiramate  150 mg Oral BID    vitamin B-1  100 mg Oral Daily      sodium chloride 75 mL/hr at 06/01/21 0214     PRN Meds: acetaminophen **OR** acetaminophen **OR** acetaminophen, diazePAM, naloxone, naloxone.     LABS:     Estimated Creatinine Clearance: 99.2 mL/min (based on SCr of 0.81 mg/dL).  Recent Labs   Lab 05/31/21  0356   WBC 6.5   RBC 3.53*   Hemoglobin 10.9*   Hematocrit 33.0*   MCV 94   PLT CT 246               Lab Results   Component Value Date    HGBA1CPERCNT 5.2 05/31/2021     Recent Labs   Lab 05/31/21  0356 05/30/21  1910   Glucose 92  --    Sodium 141  --    Potassium 3.7  --    Chloride 113*  --    CO2 21  --    BUN 10  --    Creatinine 0.81  --    Creatinine I-Stat  --  0.90   EGFR 83 73   Calcium 8.4*  --            Recent Labs   Lab 05/31/21  2207   Urine Specific Gravity 1.015   pH, Urine 7.0   Protein, UR Negative   Glucose, UA Negative   Ketones UA Negative   Bilirubin, UA Negative   Blood, UA Negative   Nitrite, UA Negative   Urobilinogen, UA 0.2*   Leukocyte Esterase, UA Small*   WBC, UA 3   RBC, UA <1   Bacteria, UA Rare*      Patient Lines/Drains/Airways Status       Active PICC Line / CVC Line / PIV Line / Drain / Airway / Intraosseous Line / Epidural Line / ART Line /  Line / Wound / Pressure Ulcer / NG/OG Tube       Name Placement date Placement time Site Days    Peripheral IV 05/30/21 20 G Left Antecubital 05/30/21  1906  Antecubital  less than 1                   No results found.   Home Health Needs:  There are no questions and answers to display.       Nutrition assessment done in  collaboration with Registered Dietitians:     Time spent:      Obie Dredge, MD     06/01/21,8:20 AM   MRN: [id number]                                      CSN: [id number] DOB: [birth year]

## 2021-06-01 NOTE — Plan of Care (Addendum)
NURSE NOTE SUMMARY  [facility] [address]   Patient Name: Laura Mclaughlin   Attending Physician: Obie Dredge, *   Today's date:   06/01/2021 LOS: 0 days   Shift Summary:                                                              Patient remains on VEEG- no witnessed events by staff- neuro assessment remains unchanged- telemetry shows SR- daughter at bedside     Provider Notifications:        Rapid Response Notifications:  Mobility:      PMP Activity: Step 6 - Walks in Room (06/01/2021  3:00 PM)     Weight tracking:  Family Dynamic:   Last 3 Weights for the past 72 hrs (Last 3 readings):   Weight   05/30/21 2318 113.4 kg (250 lb)   05/30/21 1906 113.4 kg (250 lb)             Last Bowel Movement   Last BM Date: 05/30/21        Problem: Neurological Deficit  Goal: Neurological status is stable or improving  Outcome: Progressing  Flowsheets (Taken 06/01/2021 1521)  Neurological status is stable or improving:   Monitor/assess/document neurological assessment (Stroke: every 4 hours)   Observe for seizure activity and initiate seizure precautions if indicated

## 2021-06-02 MED ORDER — VENLAFAXINE HCL ER 75 MG PO CP24
75.0000 mg | ORAL_CAPSULE | Freq: Every morning | ORAL | 0 refills | Status: DC
Start: 2021-06-03 — End: 2022-08-16

## 2021-06-02 MED ORDER — FOLIC ACID 1 MG PO TABS
1.0000 mg | ORAL_TABLET | Freq: Every day | ORAL | Status: DC
Start: 2021-06-03 — End: 2022-06-09

## 2021-06-02 NOTE — Procedures (Addendum)
PROCEDURE:  Continuous Video-EEG Monitoring (12-26 hours)    HISTORY: The patient is a 60 year old woman with a history of seizures who presented with increased frequency of seizures.   Continuous EEG monitoring was performed to clarify diagnosis and evaluate for subclinical seizures.     DESCRIPTION:  The EEG was monitored continuously with concomitant digital video recording.      FINDINGS:  DAY 1: Continuous video-EEG recording was performed from 05/31/2021 at 09:18 through 06/01/2021 at 09:20 (Duration 23 hours 58 minutes)    INTERICTAL RECORD  The waking background shows appropriate organization with clearly defined anterior-posterior voltage and frequency gradients.  There is a well-defined posterior dominant rhythm of 9 Hertz, which is symmetrical and shows normal reactivity.  Anteriorly, there is the expected pattern of lower voltage and more irregular polymorphic faster frequencies.      The sleep background is appropriately organized with well-developed spindles and vertex waves indicative of stage NR2 sleep.  These sleep transients show appropriate morphology and are bilaterally synchronous and symmetrical.     Throughout this portion of the monitoring session there were no epileptiform discharges, paroxysmal features, focal abnormalities or significant interhemispheric asymmetries.    EVENT RECORD:  The patient button was activated at 12:50 pm. Unclear from video what was being indicated (no audio).  The EEG showed no changes.    01:00 - No event button - patient reported to nursing that she had a sensation of blurry vision, fatigue, slurred speech. There were no EEG changes.    INTERIM IMPRESSION:  Normal cEEG without focal or epileptiform abnormalities. An event marked at 12:50 (description not available and no evidence of clinical changes from video-review) an an event of blurry/double vision and room spinning with slurring of speech around 01:00 showed no change in the EEG background and no evidence of  seizure activity.    ----------------------------------------  DAY 2: Continuous video-EEG recording was performed from 06/01/2021 at 09:20 through 06/02/2021 at 09:21 (duration 24 hours)    INTERICTAL RECORD  The waking background shows appropriate organization with clearly defined anterior-posterior voltage and frequency gradients.  There is a well-defined posterior dominant rhythm of 9 Hertz, which is symmetrical and shows normal reactivity, and often intermixed with a fast alpha variant.  Anteriorly, there is the expected pattern of lower voltage and more irregular polymorphic faster frequencies.      The sleep background is appropriately organized with well-developed spindles and vertex waves indicative of stage NR2 sleep.  These sleep transients show appropriate morphology and are bilaterally synchronous and symmetrical.     Throughout this portion of the monitoring session there were no epileptiform discharges, paroxysmal features, focal abnormalities or significant interhemispheric asymmetries.    EVENT RECORD:  None reported or detected    INTERIM IMPRESSION:  Normal cEEG without focal or epileptiform abnormalities.

## 2021-06-02 NOTE — Plan of Care (Signed)
Problem: Neurological Deficit  Goal: Neurological status is stable or improving  Outcome: Progressing  Flowsheets (Taken 06/01/2021 1521 by Nail, Oswaldo Conroy, RN)  Neurological status is stable or improving:   Monitor/assess/document neurological assessment (Stroke: every 4 hours)   Observe for seizure activity and initiate seizure precautions if indicated     Problem: Moderate/High Fall Risk Score >5  Goal: Patient will remain free of falls  Outcome: Progressing  Flowsheets (Taken 06/01/2021 2000)  VH High Risk (Greater than 13):   ALL REQUIRED LOW INTERVENTIONS   ALL REQUIRED MODERATE INTERVENTIONS   RED "HIGH FALL RISK" SIGNAGE   BED ALARM WILL BE ACTIVATED WHEN THE PATEINT IS IN BED WITH SIGNAGE "RESET BED ALARM"   A CHAIR PAD ALARM WILL BE USED WHEN PATIENT IS UP SITTING IN A CHAIR   PATIENT IS TO BE SUPERVISED FOR ALL TOILETING ACTIVITIES

## 2021-06-02 NOTE — UM Notes (Signed)
CONTINUED STAY REVIEW 06/02/2021    REF#:[id number]      BRENDALY TOWNSEL  1961/08/20  MRN: [id number]      MD NOTES:  7/29 NEUROLOGY  ASSESSMENT AND PLAN:   Bertram Denver, who presents with recurrent seizure-like activity over the past 7 to 10 days.  History of seizures on Xcopri, diazepam, Topamax and most recently started on Briviact on 7/26. Followed by a neurologist in [facility]; office records in bedside chart.     Feels well this morning. No further events. Okay to discontinue VEEG now and discharge from a neurology standpoint. Follow-up with her primary neurologist ASAP. No driving for six months from time of last seizure per [place] state law. Neurology signing off     Recurrent seizure activity  Diagnostics:  Video EEG recording for two days, normal  Telemetry  Vitals and neuro checks q 4 hrs  Labs:  H&H 10.9 and 33 respectively  Potassium 3.4  HbA1c 5.2%  Thyroid function normal  UA: +LE  Meds:  Home medications include:  Valium 10 mg nightly  Topamax 150 mg BID  Xcopri per titration schedule  Briviact 50 mg BID  As needed Ativan for seizure activity lasting more than 3 minutes or more than 1 seizure in 1 hour  Activity:  As tolerated  Fall/seizure/aspiration precautions  No driving for six months from time of last seizure  Rehab:         PT/OT/SLP if medically indicated       CONTINUES ON THE MEDICAL TELE FLOOR  TELE MONITORING   NEURO CHECKS & VS Q 4 HR  SZ PRECAUTIONS         Recent Labs   Lab 05/31/21  0356   WBC 6.5   Hemoglobin 10.9*   Hematocrit 33.0*   PLT CT 246          Recent Labs   Lab 05/31/21  0356   Sodium 141   Potassium 3.7   Chloride 113*   CO2 21   BUN 10   Creatinine 0.81   EGFR 83   Glucose 92   Calcium 8.4*          VITALS: 97.0 68 95% 16 133/76       Scheduled Meds:  Current Facility-Administered Medications   Medication Dose Route Frequency    Brivaracetam  50 mg Oral Q12H SCH    diazePAM  10 mg Oral QHS    enoxaparin  40 mg Subcutaneous Q24H    folic acid  1 mg Oral Daily     levothyroxine  75 mcg Oral Daily at 0600    modafinil  200 mg Oral Daily    multivitamin  1 tablet Oral Daily    pantoprazole  40 mg Oral QAM AC    NON-FORMULARY PAT OWN MED order form  1 tablet Oral Daily    pramipexole  0.75 mg Oral QHS    sodium chloride (PF)  3 mL Intravenous Q12H SCH    sodium chloride (PF)  3 mL Intravenous Q12H SCH    topiramate  150 mg Oral BID    venlafaxine  75 mg Oral QAM W/BREAKFAST    vitamin B-1  100 mg Oral Daily     Continuous Infusions:  PRN Meds:.acetaminophen **OR** acetaminophen **OR** acetaminophen, diazePAM, naloxone, naloxone     Moises Terpstra Animator, RN  Utilization Management Review Nurse  [employer] [employer] [address]  [address]  [place], [place]  [address]  [phone] office Phone, Direct and Confidential  [phone], Fax  [email or web address]
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
## 2021-06-02 NOTE — Discharge Summary (Signed)
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 female patient with history of hypothyroidism, SLE and epilepsy who presented with seizure activity.  It was reported the patient had recurring seizure this month.  Followed by a neurologist Dr. Jeannie Done at  [facility] and takes Xcopri, Topamax and diazepam.  Because of increased frequency of seizures she was prescribed Briviact the day prior to admission.  In the afternoon of her day of presentation,  while patient was in the care with her daughter, she lost consciousness. Daughter reports nonconvulsive seizure activity.  The patient afterwards was noted to be confused and later regained her baseline mental status.  This made her come to ER for further evaluation.  They have been in touch with their neurologist who recommended a 48-hour video EEG monitoring.       Video EG monitoring initiated . Patient reports some neuro symptoms including blurring/double vision and some vertigo symptoms slurring of speech however notes correlated with EEG changes; as per neurologist's report no epileptiform activity and it was normal Conditions vEEG without focal or epileptiform abnormalities. Neurology recommended to continue her antiepileptic  medications and to follow-up with her primary neurologist ASAP . No driving for six months from time of last seizure per [place] state law.      Pending Results and other significant studies:  None     Discharge Instructions:           Disposition: Home  Diet: Regular Diet  Activity: As tolerated  Discharge Code Status: Full Code    Regis Bill, DO  [address]  [place] [address]  [phone]    Follow up in 1 week(s)  [phone] (use this number if you need to call the office)  Office will call patient with an appointment    Remi Haggard, MD  [address]  [place]  [phone]    Follow up         Discharge Medications:                                                                        Discharge Medication List        Taking      Briviact 50 MG Tabs tablet  Dose: 50 mg  Generic drug: Brivaracetam  Take 50 mg by mouth every 12 (twelve) hours     diazePAM 5 MG tablet  Dose: 10 mg  Commonly known as: VALIUM  Take 10 mg by mouth nightly     folic acid 1 MG tablet  Dose: 1 mg  Commonly known as: [employer]  taking on: June 03, 2021  Take 1 tablet (1 mg total) by mouth daily     levothyroxine 75 MCG tablet  Dose: 75 mcg  Commonly known as: SYNTHROID  Take 75 mcg by mouth Once a day at 6:00am     modafinil 200 MG tablet  Dose: 200 mg  Commonly known as: PROVIGIL  Take 200 mg by mouth daily     pantoprazole 40 MG tablet  Dose: 40 mg  Commonly known as: PROTONIX  Take 40 mg by mouth daily     pramipexole 0.75 MG tablet  Dose: 0.75 mg  Commonly known as: MIRAPEX  Take 0.75 mg by mouth nightly     topiramate 50 MG tablet  Dose: 50 mg  Commonly known as: TOPAMAX  Take 50 mg by mouth 2 (two) times daily     venlafaxine 75 MG 24 hr capsule  Dose: 75 mg  Commonly known as: EFFEXOR-XR  Start taking on: June 03, 2021  Take 1 capsule (75 mg total) by mouth every morning with breakfast     Xcopri 14 x 150 MG & 14 x200 MG Tbpk  Generic drug: Cenobamate  Take by mouth               Discharge Day Exam (06/02/2021):     Blood pressure 133/76, pulse 68, temperature 97 F (36.1 C), temperature source Temporal, resp. rate 16, height 1.753 m (5' 9.02"), weight 113.4 kg (250 lb), SpO2 95 %.           General: Patient is awake.  In no acute distress.  Chest: CTA bilaterally. No rhonchi, no wheezing. No use of accessory muscles.  CVS: Normal rate and regular rhythm no murmurs, without JVD.  Abdomen: Soft, non-tender, no guarding or rigidity, with normal bowel sounds.  Extremities: No pitting edema, pulses palpable, no calf swelling and no gross deformity.  Skin: Warm, dry  NEURO: No motor or sensory deficits.     Recent Labs      Recent Labs   Lab 05/31/21  0356   WBC 6.5   RBC 3.53*   Hemoglobin 10.9*   Hematocrit 33.0*   MCV 94   PLT CT 246             Lab Results   Component Value Date    HGBA1CPERCNT 5.2 05/31/2021     Recent Labs   Lab 05/31/21  0356 05/30/21  1910   Glucose 92  --    Sodium 141  --    Potassium 3.7  --    Chloride 113*  --    CO2 21  --    BUN 10  --    Creatinine 0.81  --    Creatinine I-Stat  --  0.90   EGFR 83 73   Calcium 8.4*  --             Allergies:      Dilaudid [hydromorphone] and Reglan [metoclopramide]   Time spent on discharging the patient:  35  minutes   No results found.   Home Health Needs:  There are no questions and answers to display.      Obie Dredge, MD         06/02/21 1:44 PM   MRN: [id number]  CSN: [id number] DOB: 1961-09-14

## 2021-06-02 NOTE — Plan of Care (Signed)
NURSE NOTE SUMMARY  [facility] [address]   Patient Name: Laura Mclaughlin   Attending Physician: Obie Dredge, *   Today's date:   06/02/2021 LOS: 1 days   Shift Summary:                                                              More alert than previous shifts, no seizure activity during stay. Able to be discharged with daughter as the driver.     Provider Notifications:        Rapid Response Notifications:  Mobility:      PMP Activity: Step 6 - Walks in Room (06/02/2021  8:00 AM)     Weight tracking:  Family Dynamic:   Last 3 Weights for the past 72 hrs (Last 3 readings):   Weight   05/30/21 2318 113.4 kg (250 lb)   05/30/21 1906 113.4 kg (250 lb)             Last Bowel Movement   Last BM Date: 05/30/21        Problem: Neurological Deficit  Goal: Neurological status is stable or improving  Description: Interventions:  1. Monitor/assess/document neurological assessment (Stroke: every 4 hours)  2. Monitor/assess NIH Stroke Scale   3. Re-assess NIH Stroke Scale for any change in status  4. Observe for seizure activity and initiate seizure precautions if indicated  5. Perform CAM Assessment  Outcome: Adequate for Discharge     Problem: Moderate/High Fall Risk Score >5  Description: Fall Risk Score > 5  Goal: Patient will remain free of falls  Outcome: Adequate for Discharge
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
## 2022-05-23 IMAGING — CT CT HEAD W/O CM
3 series · 15 of 47 positions shown, 18 images · non-contrast
Comparison: None.

CLINICAL DATA: Headache.  History of seizures

EXAM:
CT HEAD WITHOUT CONTRAST
TECHNIQUE: Contiguous axial images were obtained from the base of the skull
through the vertex without intravenous contrast.

[Series 3: head wo · axial · 0.44mm/px · z∈[-115,+10]mm · 9 of 30 slices shown, 12 images]
[im 3/30  brain]
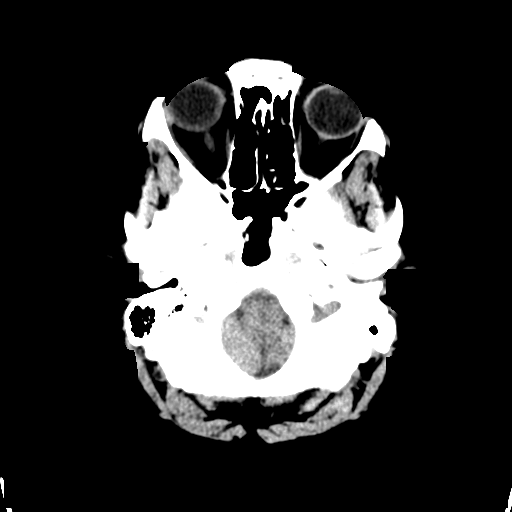
[im 3/30  bone]
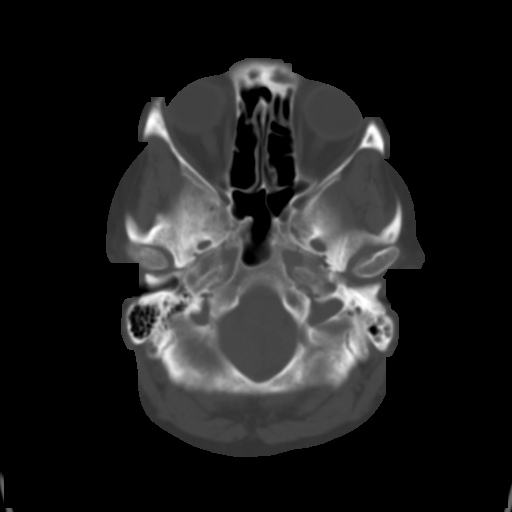
[im 6/30  brain]
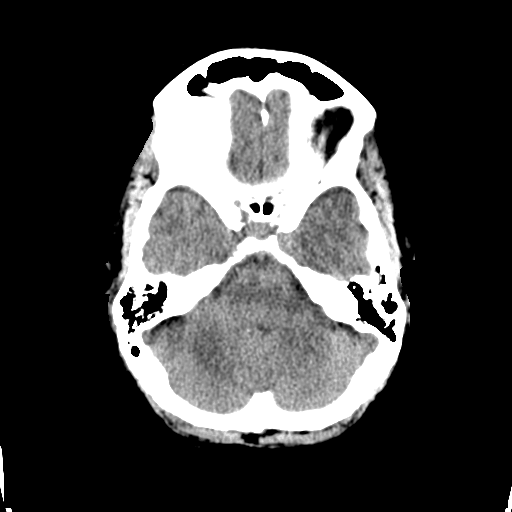
[im 9/30  brain]
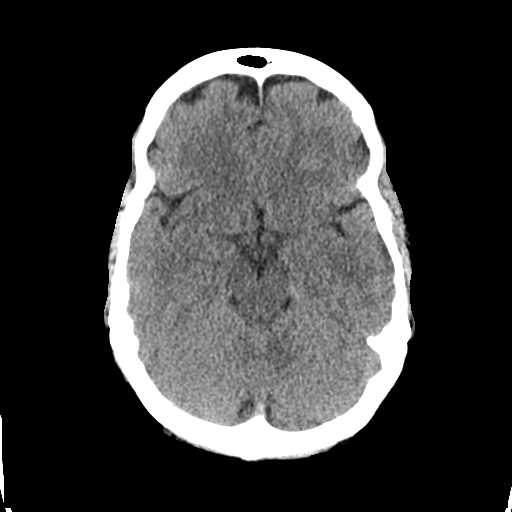
[im 12/30  brain]
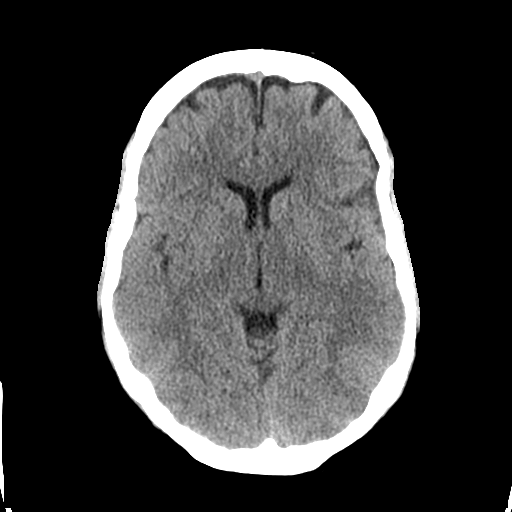
[im 16/30  brain]
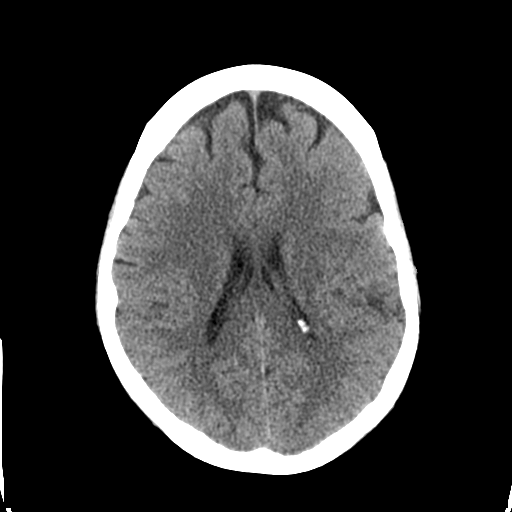
[im 16/30  bone]
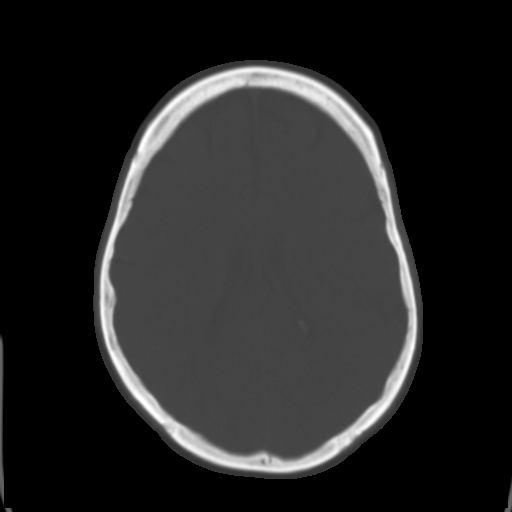
[im 19/30  brain]
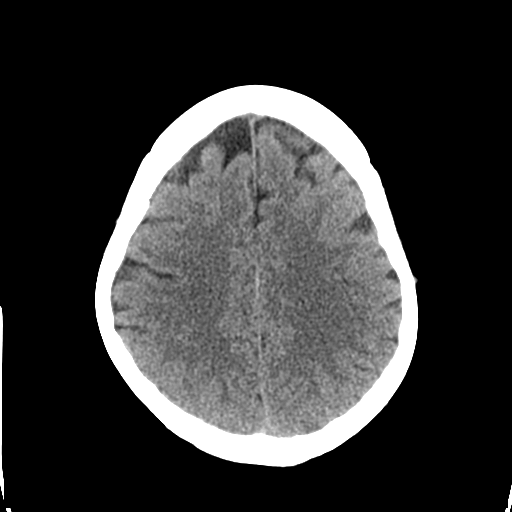
[im 22/30  brain]
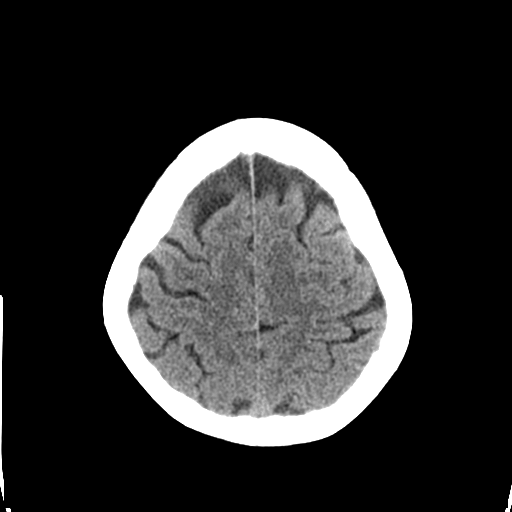
[im 25/30  brain]
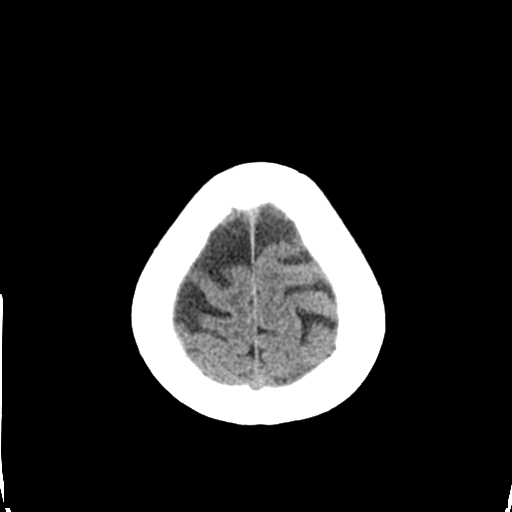
[im 28/30  brain]
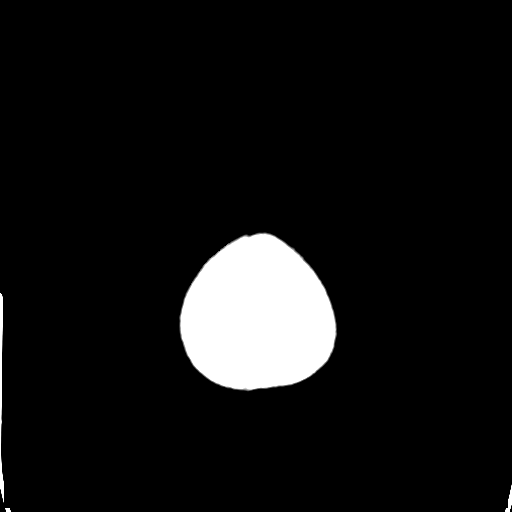
[im 28/30  bone]
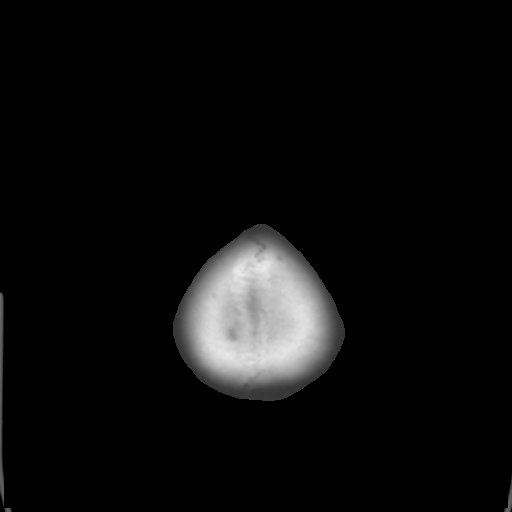

[Series 4: coronal soft tissue · coronal · 0.29mm/px · 3 of 65 slices shown]
[im 22/65  brain]
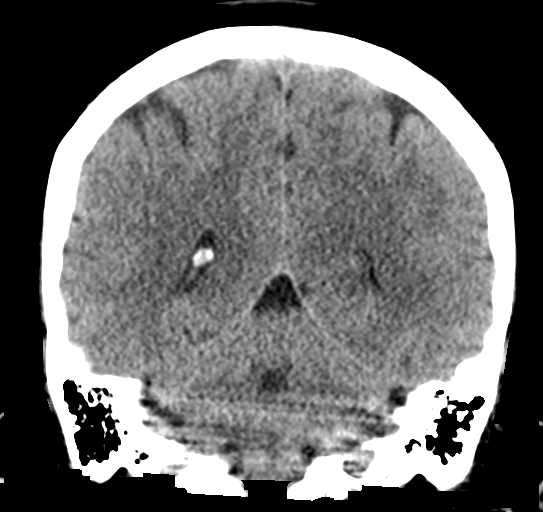
[im 29/65  brain]
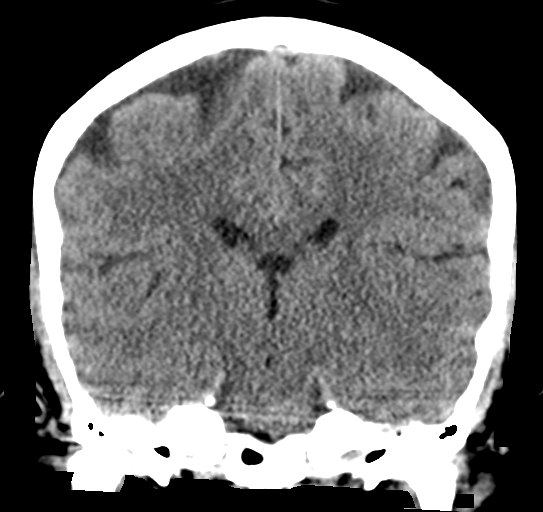
[im 36/65  brain]
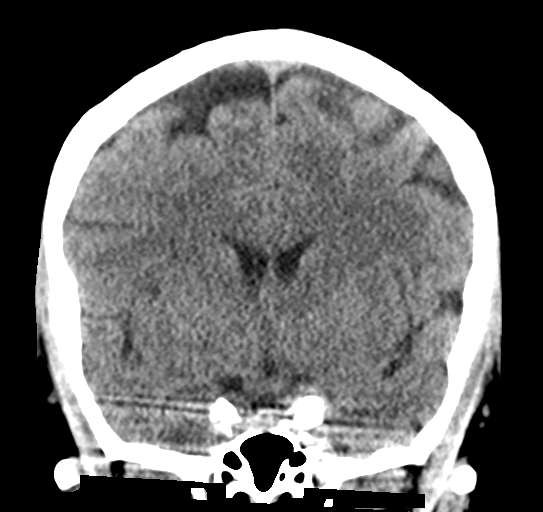

[Series 5: sagittal soft tissue · sagittal · 0.29mm/px · 3 of 52 slices shown]
[im 18/52  brain]
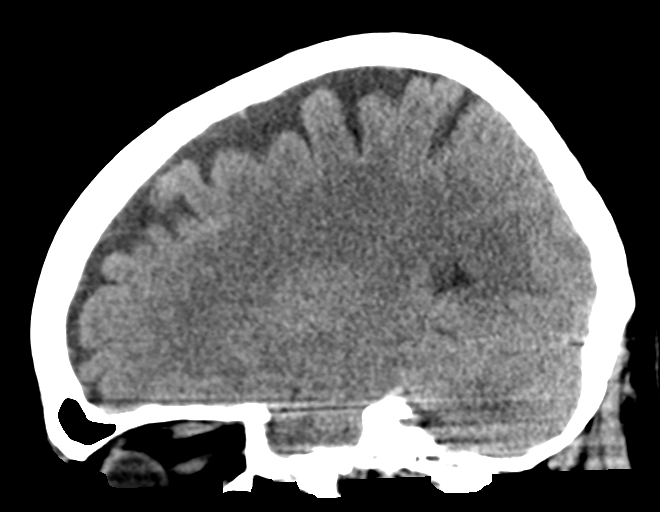
[im 26/52  brain]
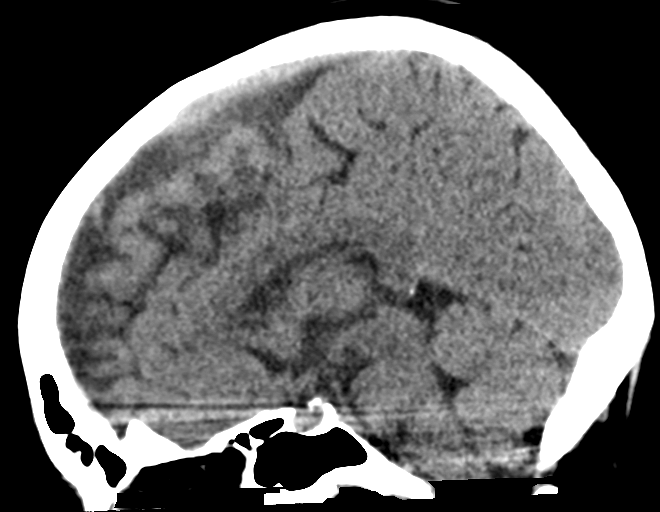
[im 35/52  brain]
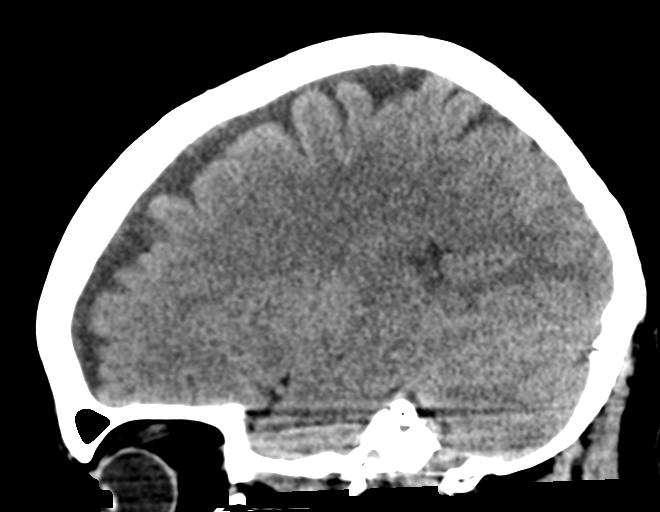

[15 of 47 positions shown; findings below may reference images not displayed]

FINDINGS: Brain: The ventricles are normal in size and configuration. There is
symmetric mild frontal atrophy bilaterally. There is no intracranial
mass, hemorrhage, extra-axial fluid collection, or midline shift.
Brain parenchyma appears unremarkable. No demonstrable acute
infarct.

Vascular: No hyperdense mass. There are foci of calcification in the
carotid siphon regions.

Skull: Bony calvarium appears intact.

Sinuses/Orbits: There is mucosal thickening involving several
ethmoid air cells. Other visualized paranasal sinuses are clear.
Visualized orbits appear symmetric bilaterally.

Other: Mastoid air cells are clear.
IMPRESSION: Mild frontal atrophy bilaterally.  Ventricles appear normal.

Brain parenchyma appears unremarkable.  No mass or hemorrhage.

There are foci of arterial vascular calcification. There is mucosal
thickening in several ethmoid air cells.

## 2022-05-23 IMAGING — CT CT ABD-PELV W/ CM
2 of 5 series · 16 of 46 positions shown, 18 images · IV contrast (APPLIED)
Comparison: None.

CLINICAL DATA: Nausea, vomiting.

EXAM:
CT ABDOMEN AND PELVIS WITH CONTRAST
TECHNIQUE: Multidetector CT imaging of the abdomen and pelvis was performed
using the standard protocol following bolus administration of
intravenous contrast.
CONTRAST:  100mL OMNIPAQUE IOHEXOL 300 MG/ML  SOLN

[Series 2: routine abd/pel with · axial · 0.95mm/px · z∈[-545,-80]mm · 13 of 105 slices shown, 15 images]
[im 6/105  soft-tissue]
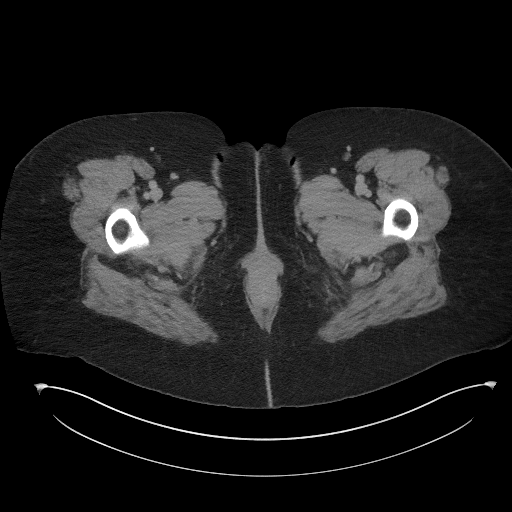
[im 6/105  bone]
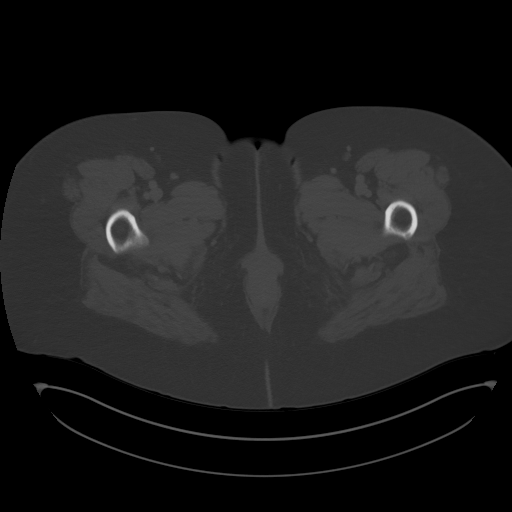
[im 17/105  soft-tissue]
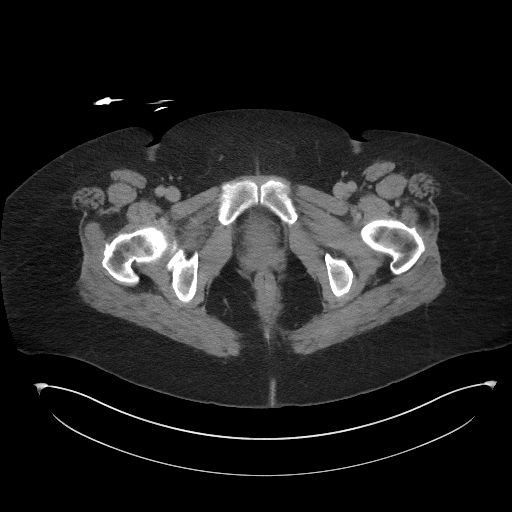
[im 22/105  soft-tissue]
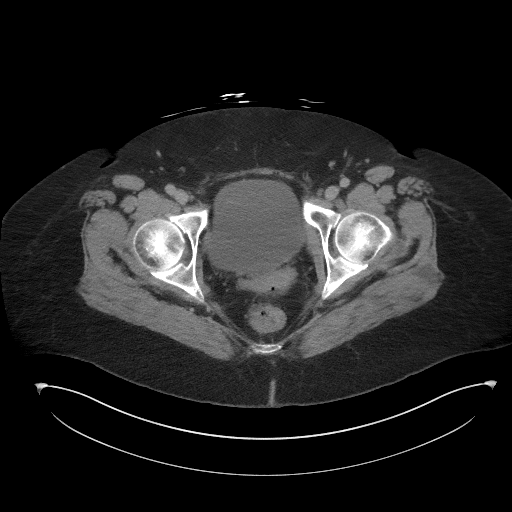
[im 28/105  soft-tissue]
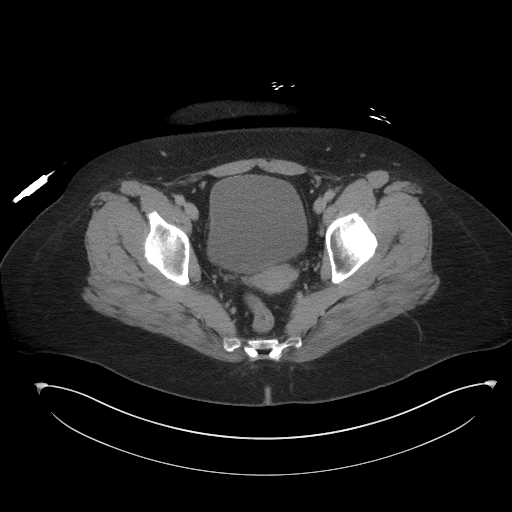
[im 39/105  soft-tissue]
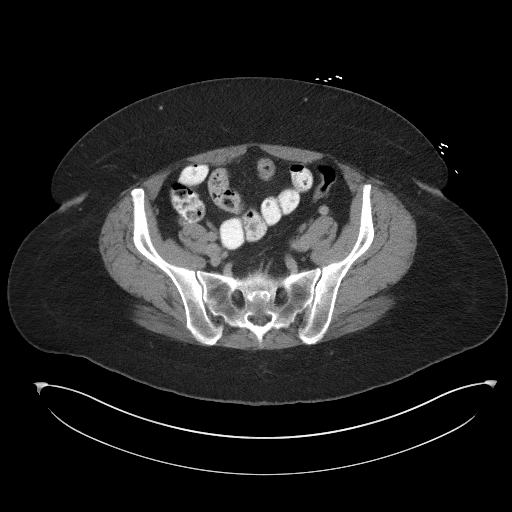
[im 44/105  soft-tissue]
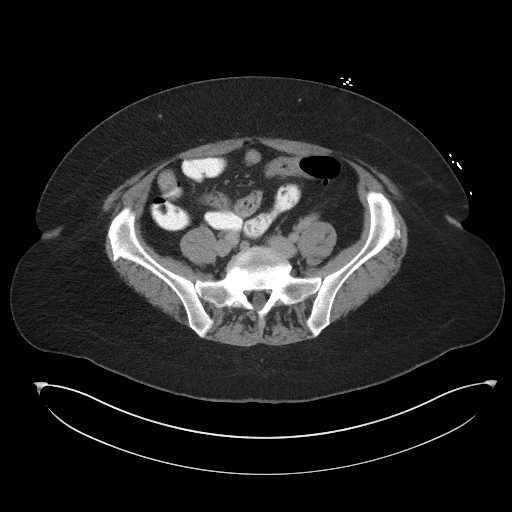
[im 55/105  soft-tissue]
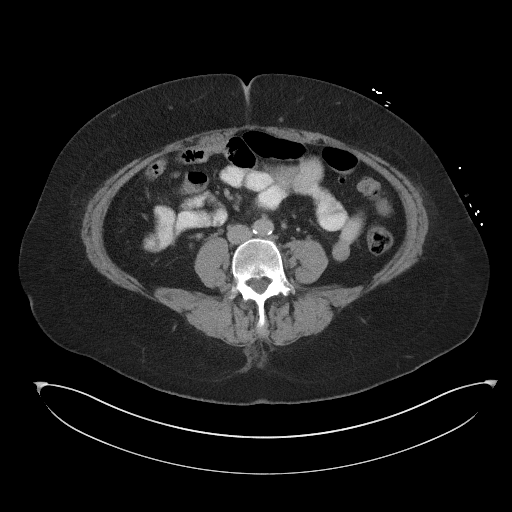
[im 61/105  soft-tissue]
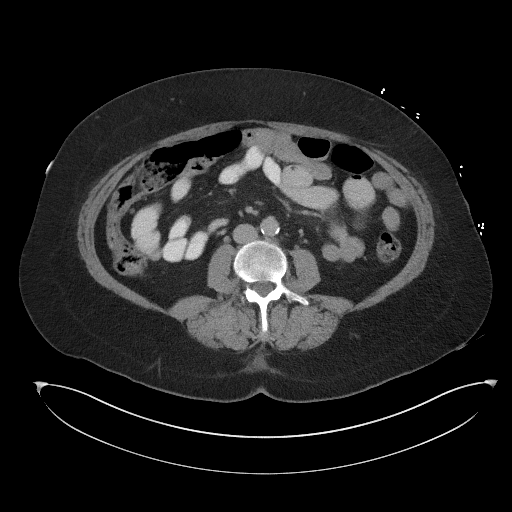
[im 66/105  soft-tissue]
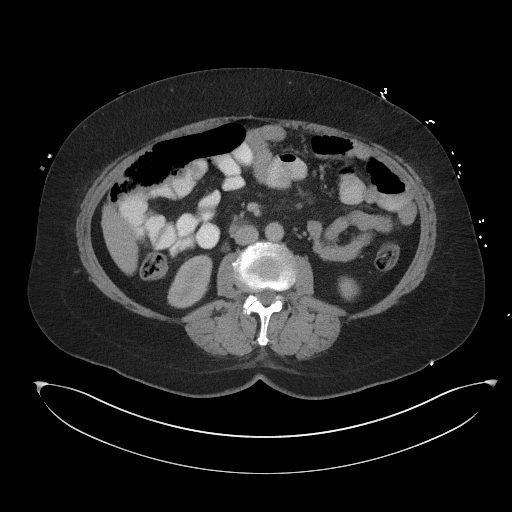
[im 66/105  bone]
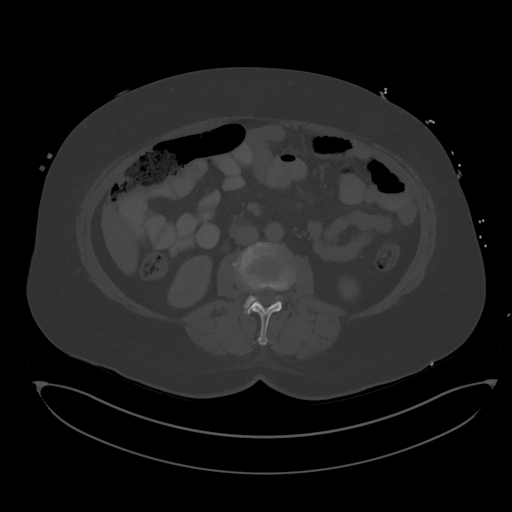
[im 77/105  soft-tissue]
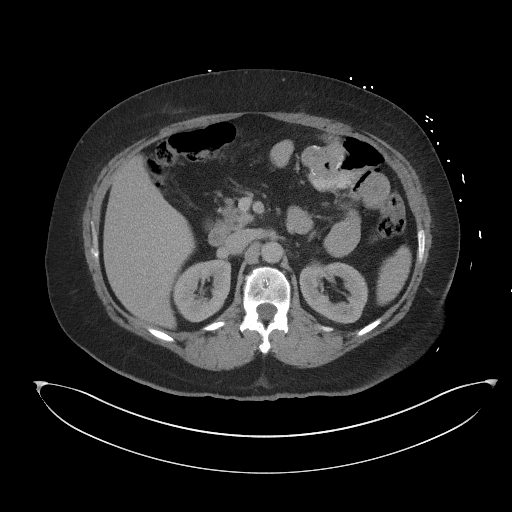
[im 83/105  soft-tissue]
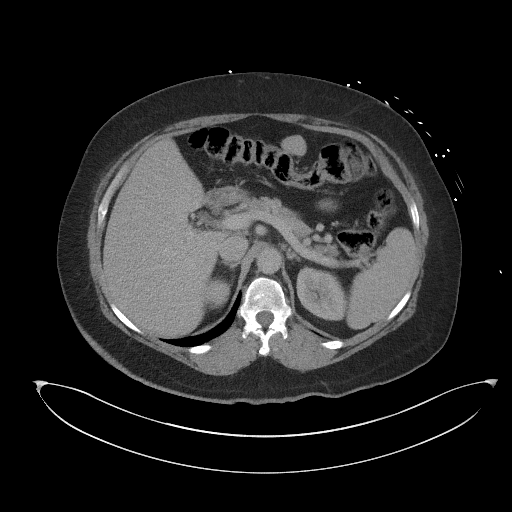
[im 88/105  soft-tissue]
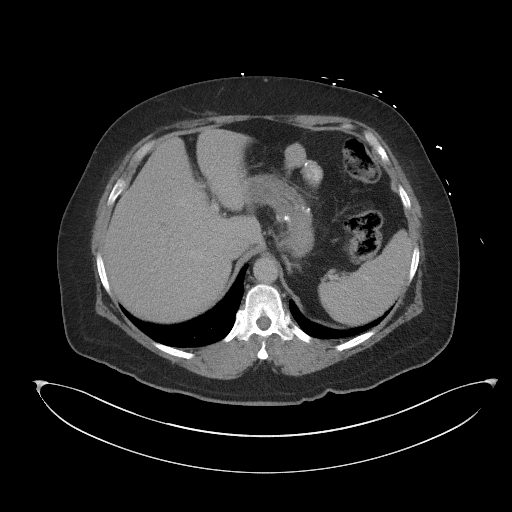
[im 99/105  soft-tissue]
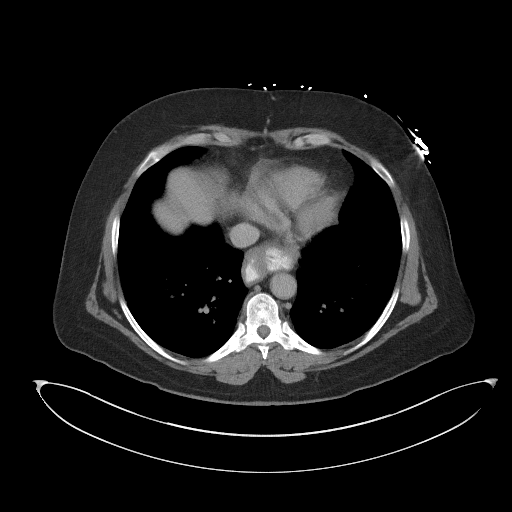

[Series 6: coronal st · coronal · 0.78mm/px · 3 of 100 slices shown]
[im 34/100  soft-tissue]
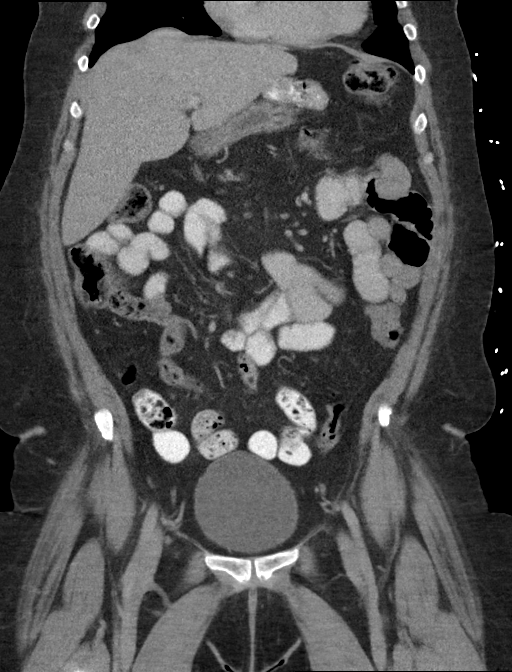
[im 45/100  soft-tissue]
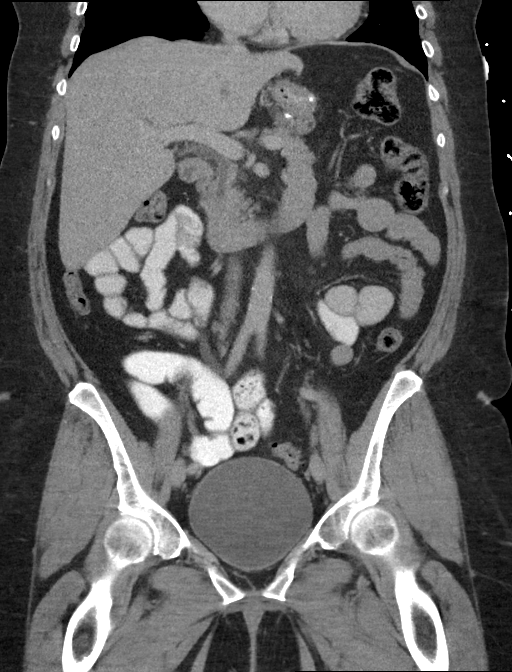
[im 56/100  soft-tissue]
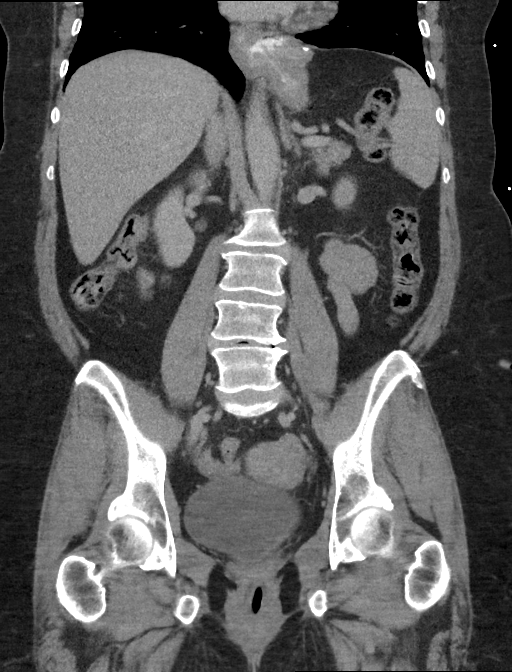

[16 of 46 positions shown; findings below may reference images not displayed]

FINDINGS: Lower chest: Mild airspace opacities are noted in the right lung
base concerning for possible pneumonia. Moderate size sliding-type
hiatal hernia is noted.

Hepatobiliary: No focal liver abnormality is seen. Status post
cholecystectomy. No biliary dilatation.

Pancreas: Unremarkable. No pancreatic ductal dilatation or
surrounding inflammatory changes.

Spleen: Normal in size without focal abnormality.

Adrenals/Urinary Tract: Adrenal glands are unremarkable. Kidneys are
normal, without renal calculi, focal lesion, or hydronephrosis.
Bladder is unremarkable.

Stomach/Bowel: Status post gastric bypass. There is no evidence of
bowel obstruction or inflammation. The appendix is unremarkable.

Vascular/Lymphatic: Aortic atherosclerosis. No enlarged abdominal or
pelvic lymph nodes.

Reproductive: Uterus and bilateral adnexa are unremarkable.

Other: No abdominal wall hernia or abnormality. No abdominopelvic
ascites.

Musculoskeletal: No acute or significant osseous findings.
IMPRESSION: 1. Mild airspace opacities are noted in the right lung base
concerning for possible pneumonia.
2. Moderate size sliding-type hiatal hernia.
3. Status post gastric bypass.
4. No other abnormality seen in the abdomen or pelvis.
5. Aortic atherosclerosis.

Aortic Atherosclerosis ([id number]-QWE.E).

## 2022-06-09 ENCOUNTER — Emergency Department: Payer: Medicare Other

## 2022-06-09 ENCOUNTER — Emergency Department: Payer: Auto Insurance (includes no fault)

## 2022-06-09 ENCOUNTER — Inpatient Hospital Stay: Payer: Medicare Other

## 2022-06-09 ENCOUNTER — Encounter: Payer: Self-pay | Admitting: Surgery

## 2022-06-09 ENCOUNTER — Emergency Department
Admission: AC | Admit: 2022-06-09 | Discharge: 2022-06-09 | Disposition: A | Discharge status: Other Acute Inpatient Hospital | Payer: Auto Insurance (includes no fault) | Attending: Emergency Medicine | Admitting: Emergency Medicine

## 2022-06-09 ENCOUNTER — Inpatient Hospital Stay
Admission: AC | Admit: 2022-06-09 | Discharge: 2022-06-29 | DRG: 956 | Disposition: A | Discharge status: Rehabilitation Facility | Payer: Medicare Other | Source: Other Acute Inpatient Hospital | Attending: Surgery | Admitting: Surgery

## 2022-06-09 DIAGNOSIS — G8911 Acute pain due to trauma: Secondary | ICD-10-CM | POA: Diagnosis present

## 2022-06-09 DIAGNOSIS — S12100A Unspecified displaced fracture of second cervical vertebra, initial encounter for closed fracture: Secondary | ICD-10-CM | POA: Diagnosis present

## 2022-06-09 DIAGNOSIS — S36116A Major laceration of liver, initial encounter: Secondary | ICD-10-CM | POA: Diagnosis present

## 2022-06-09 DIAGNOSIS — T794XXA Traumatic shock, initial encounter: Secondary | ICD-10-CM | POA: Diagnosis present

## 2022-06-09 DIAGNOSIS — S82141A Displaced bicondylar fracture of right tibia, initial encounter for closed fracture: Secondary | ICD-10-CM | POA: Diagnosis present

## 2022-06-09 DIAGNOSIS — S72402A Unspecified fracture of lower end of left femur, initial encounter for closed fracture: Secondary | ICD-10-CM | POA: Diagnosis present

## 2022-06-09 DIAGNOSIS — S12101A Unspecified nondisplaced fracture of second cervical vertebra, initial encounter for closed fracture: Secondary | ICD-10-CM

## 2022-06-09 DIAGNOSIS — S301XXA Contusion of abdominal wall, initial encounter: Secondary | ICD-10-CM

## 2022-06-09 DIAGNOSIS — S52571A Other intraarticular fracture of lower end of right radius, initial encounter for closed fracture: Secondary | ICD-10-CM | POA: Diagnosis present

## 2022-06-09 DIAGNOSIS — S36112A Contusion of liver, initial encounter: Secondary | ICD-10-CM

## 2022-06-09 DIAGNOSIS — S12000A Unspecified displaced fracture of first cervical vertebra, initial encounter for closed fracture: Secondary | ICD-10-CM | POA: Insufficient documentation

## 2022-06-09 DIAGNOSIS — S52502A Unspecified fracture of the lower end of left radius, initial encounter for closed fracture: Secondary | ICD-10-CM | POA: Insufficient documentation

## 2022-06-09 DIAGNOSIS — Z79899 Other long term (current) drug therapy: Secondary | ICD-10-CM

## 2022-06-09 DIAGNOSIS — S7292XA Unspecified fracture of left femur, initial encounter for closed fracture: Secondary | ICD-10-CM

## 2022-06-09 DIAGNOSIS — R109 Unspecified abdominal pain: Secondary | ICD-10-CM | POA: Insufficient documentation

## 2022-06-09 DIAGNOSIS — F419 Anxiety disorder, unspecified: Secondary | ICD-10-CM | POA: Insufficient documentation

## 2022-06-09 DIAGNOSIS — Y92410 Unspecified street and highway as the place of occurrence of the external cause: Secondary | ICD-10-CM

## 2022-06-09 DIAGNOSIS — S83261A Peripheral tear of lateral meniscus, current injury, right knee, initial encounter: Secondary | ICD-10-CM | POA: Diagnosis present

## 2022-06-09 DIAGNOSIS — S82201A Unspecified fracture of shaft of right tibia, initial encounter for closed fracture: Secondary | ICD-10-CM

## 2022-06-09 DIAGNOSIS — S6291XA Unspecified fracture of right wrist and hand, initial encounter for closed fracture: Secondary | ICD-10-CM

## 2022-06-09 DIAGNOSIS — Z9884 Bariatric surgery status: Secondary | ICD-10-CM

## 2022-06-09 DIAGNOSIS — T07XXXA Unspecified multiple injuries, initial encounter: Secondary | ICD-10-CM

## 2022-06-09 DIAGNOSIS — S72492A Other fracture of lower end of left femur, initial encounter for closed fracture: Secondary | ICD-10-CM | POA: Insufficient documentation

## 2022-06-09 DIAGNOSIS — R519 Headache, unspecified: Secondary | ICD-10-CM | POA: Insufficient documentation

## 2022-06-09 DIAGNOSIS — Z6841 Body Mass Index (BMI) 40.0 and over, adult: Secondary | ICD-10-CM

## 2022-06-09 DIAGNOSIS — S72452A Displaced supracondylar fracture without intracondylar extension of lower end of left femur, initial encounter for closed fracture: Secondary | ICD-10-CM | POA: Diagnosis present

## 2022-06-09 DIAGNOSIS — D62 Acute posthemorrhagic anemia: Secondary | ICD-10-CM | POA: Diagnosis present

## 2022-06-09 DIAGNOSIS — S82401A Unspecified fracture of shaft of right fibula, initial encounter for closed fracture: Secondary | ICD-10-CM | POA: Diagnosis present

## 2022-06-09 DIAGNOSIS — S59911A Unspecified injury of right forearm, initial encounter: Secondary | ICD-10-CM | POA: Insufficient documentation

## 2022-06-09 DIAGNOSIS — S0181XA Laceration without foreign body of other part of head, initial encounter: Secondary | ICD-10-CM | POA: Diagnosis present

## 2022-06-09 DIAGNOSIS — S82301A Unspecified fracture of lower end of right tibia, initial encounter for closed fracture: Secondary | ICD-10-CM | POA: Insufficient documentation

## 2022-06-09 DIAGNOSIS — Z01810 Encounter for preprocedural cardiovascular examination: Secondary | ICD-10-CM

## 2022-06-09 DIAGNOSIS — G40909 Epilepsy, unspecified, not intractable, without status epilepticus: Secondary | ICD-10-CM | POA: Diagnosis present

## 2022-06-09 HISTORY — DX: Hypothyroidism, unspecified: E03.9

## 2022-06-09 HISTORY — DX: Systemic lupus erythematosus, unspecified: M32.9

## 2022-06-09 HISTORY — DX: Unspecified convulsions: R56.9

## 2022-06-09 HISTORY — DX: Depression, unspecified: F32.A

## 2022-06-09 HISTORY — DX: Major laceration of liver, initial encounter: S36.116A

## 2022-06-09 LAB — BASIC METABOLIC PANEL
Anion Gap: 15 (ref 5.0–15.0)
BUN: 15 mg/dL (ref 7.0–21.0)
CO2: 20 mEq/L (ref 17–29)
Calcium: 8.5 mg/dL (ref 7.9–10.2)
Chloride: 108 mEq/L (ref 99–111)
Creatinine: 0.7 mg/dL (ref 0.4–1.0)
Glucose: 130 mg/dL — ABNORMAL HIGH (ref 70–100)
Potassium: 5.1 mEq/L (ref 3.5–5.3)
Sodium: 143 mEq/L (ref 135–145)
eGFR: >60 mL/min/{1.73_m2} (ref >=60)

## 2022-06-09 LAB — VENOUS EG7 POCT
Hematocrit I-Stat: 31 % — ABNORMAL LOW (ref 37.0–47.0)
Hemoglobin I-Stat: 10.5 g/dL — ABNORMAL LOW (ref 12.0–16.0)
Potassium I-Stat: 4.8 mEq/L (ref 3.5–4.9)
Sodium I-Stat: 140 mEq/L (ref 138–146)
i-STAT Base Excess Venous: -2 mEq/L
i-STAT Calcium Ionized: 1.7 mEq/L — ABNORMAL LOW (ref 2.24–2.64)
i-STAT FIO2: 36
i-STAT HCO3 Bicarbonate Venous: 21.7 mEq/L
i-STAT O2 Saturation Venous: 60 %
i-STAT Patient Temperature: 99.1
i-STAT Total CO2 Venous: 23 mEq/L — ABNORMAL LOW (ref 24.0–29.0)
i-STAT pCO2 Venous: 34.1 mmHg
i-STAT pH Venous: 7.412
i-STAT pO2 Venous: 31 mmHg

## 2022-06-09 LAB — CBC AND DIFFERENTIAL
Basophils %: 0.1 % (ref 0.0–3.0)
Basophils Absolute: 0 10*3/uL (ref 0.0–0.3)
Eosinophils %: 0.9 % (ref 0.0–7.0)
Eosinophils Absolute: 0.1 10*3/uL (ref 0.0–0.8)
Hematocrit: 33.9 % — ABNORMAL LOW (ref 36.0–48.0)
Hemoglobin: 11.1 gm/dL — ABNORMAL LOW (ref 12.0–16.0)
Lymphocytes Absolute: 2.1 10*3/uL (ref 0.6–5.1)
Lymphocytes: 15.7 % (ref 15.0–46.0)
MCH: 31 pg (ref 28–35)
MCHC: 33 gm/dL (ref 32–36)
MCV: 94 fL (ref 80–100)
MPV: 8.1 fL (ref 6.0–10.0)
Monocytes Absolute: 0.4 10*3/uL (ref 0.1–1.7)
Monocytes: 3.2 % (ref 3.0–15.0)
Neutrophils %: 80 % — ABNORMAL HIGH (ref 42.0–78.0)
Neutrophils Absolute: 10.6 10*3/uL — ABNORMAL HIGH (ref 1.7–8.6)
PLT CT: 358 10*3/uL (ref 130–440)
RBC: 3.59 10*6/uL — ABNORMAL LOW (ref 3.80–5.00)
RDW: 12.3 % (ref 11.0–14.0)
WBC: 13.3 10*3/uL — ABNORMAL HIGH (ref 4.0–11.0)

## 2022-06-09 LAB — ARTERIAL EG7 POCT
Hematocrit I-Stat: 27 % — ABNORMAL LOW (ref 37.0–47.0)
Hemoglobin I-Stat: 9.2 g/dL — ABNORMAL LOW (ref 12.0–16.0)
Potassium I-Stat: 4.1 mEq/L (ref 3.5–4.9)
Sodium I-Stat: 140 mEq/L (ref 138–146)
i-STAT Base Excess Arterial: -1 mEq/L (ref <=2.0)
i-STAT Calcium Ionized: 2.2 mEq/L — ABNORMAL LOW (ref 2.24–2.64)
i-STAT FIO2: 0.36
i-STAT HCO3 Bicarbonate Arterial: 20.9 mEq/L — ABNORMAL LOW (ref 23.0–29.0)
i-STAT Liters Per Minute: 4
i-STAT O2 Saturation Arterial: 99 % (ref 95.0–100.0)
i-STAT Patient Temperature: 99.2
i-STAT Total CO2 Arterial: 22 mEq/L — ABNORMAL LOW (ref 24.0–30.0)
i-STAT pCO2 Arterial: 25.1 mmHg — ABNORMAL LOW (ref 35.0–45.0)
i-STAT pH Arterial: 7.531 — ABNORMAL HIGH (ref 7.350–7.450)
i-STAT pO2 Arterial: 116 mmHg — ABNORMAL HIGH (ref 80.0–90.0)

## 2022-06-09 LAB — VH URINALYSIS WITH MICROSCOPIC AND CULTURE IF INDICATED
Bilirubin, UA: NEGATIVE
Blood, UA: NEGATIVE
Glucose, UA: NEGATIVE mg/dL
Ketones UA: 80 mg/dL — AB
Leukocyte Esterase, UA: NEGATIVE Leu/uL
Nitrite, UA: NEGATIVE
Protein, UR: 30 mg/dL — AB
Squam Epithel, UA: 2 /hpf (ref 0–2)
Urobilinogen, UA: 4 mg/dL — AB
WBC, UA: 1 /hpf (ref 0–4)
pH, Urine: 7 pH (ref 5.0–8.0)

## 2022-06-09 LAB — CBC
Absolute NRBC: 0 10*3/uL (ref 0.00–0.00)
Hematocrit: 31.1 % — ABNORMAL LOW (ref 34.7–43.7)
Hgb: 10.4 g/dL — ABNORMAL LOW (ref 11.4–14.8)
MCH: 29.5 pg (ref 25.1–33.5)
MCHC: 33.4 g/dL (ref 31.5–35.8)
MCV: 88.4 fL (ref 78.0–96.0)
MPV: 10.3 fL (ref 8.9–12.5)
Nucleated RBC: 0 /100 WBC (ref 0.0–0.0)
Platelets: 335 10*3/uL (ref 142–346)
RBC: 3.52 10*6/uL — ABNORMAL LOW (ref 3.90–5.10)
RDW: 14 % (ref 11–15)
WBC: 14.25 10*3/uL — ABNORMAL HIGH (ref 3.10–9.50)

## 2022-06-09 LAB — I-STAT CHEM 8 CARTRIDGE
Anion Gap I-Stat: 20 — ABNORMAL HIGH (ref 7.0–16.0)
BUN I-Stat: 19 mg/dL (ref 7–22)
Calcium Ionized I-Stat: 4.2 mg/dL — ABNORMAL LOW (ref 4.35–5.10)
Chloride I-Stat: 103 mMol/L (ref 98–110)
Creatinine I-Stat: 0.8 mg/dL (ref 0.60–1.20)
EGFR: 84 mL/min/{1.73_m2} (ref 60–150)
Glucose I-Stat: 127 mg/dL — ABNORMAL HIGH (ref 71–99)
Hematocrit I-Stat: 33 % — ABNORMAL LOW (ref 36.0–48.0)
Hemoglobin I-Stat: 11.2 gm/dL — ABNORMAL LOW (ref 12.0–16.0)
Potassium I-Stat: 4.1 mMol/L (ref 3.5–5.3)
Sodium I-Stat: 139 mMol/L (ref 136–147)
TCO2 I-Stat: 21 mMol/L — ABNORMAL LOW (ref 24–29)

## 2022-06-09 LAB — COMPREHENSIVE METABOLIC PANEL
ALT: 33 U/L (ref 0–55)
AST (SGOT): 45 U/L — ABNORMAL HIGH (ref 10–42)
Albumin/Globulin Ratio: 0.83 Ratio (ref 0.80–2.00)
Albumin: 3 gm/dL — ABNORMAL LOW (ref 3.5–5.0)
Alkaline Phosphatase: 128 U/L (ref 40–145)
Anion Gap: 18 mMol/L (ref 7.0–18.0)
BUN / Creatinine Ratio: 18.9 Ratio (ref 10.0–30.0)
BUN: 17 mg/dL (ref 7–22)
Bilirubin, Total: 0.5 mg/dL (ref 0.1–1.2)
CO2: 20 mMol/L (ref 20–30)
Calcium: 9.1 mg/dL (ref 8.5–10.5)
Chloride: 106 mMol/L (ref 98–110)
Creatinine: 0.9 mg/dL (ref 0.60–1.20)
EGFR: 73 mL/min/{1.73_m2} (ref 60–150)
Globulin: 3.6 gm/dL (ref 2.0–4.0)
Glucose: 130 mg/dL — ABNORMAL HIGH (ref 71–99)
Osmolality Calculated: 283 mOsm/kg (ref 275–300)
Potassium: 4 mMol/L (ref 3.5–5.3)
Protein, Total: 6.6 gm/dL (ref 6.0–8.3)
Sodium: 140 mMol/L (ref 136–147)

## 2022-06-09 LAB — PT AND APTT
PT INR: 1.2 — ABNORMAL HIGH (ref 0.9–1.1)
PT: 13.5 s — ABNORMAL HIGH (ref 10.1–12.9)
PTT: 28 s (ref 27–39)

## 2022-06-09 LAB — AMYLASE: Amylase: 30 U/L (ref 30–135)

## 2022-06-09 LAB — PT/INR
PT INR: 1 (ref 0.9–1.1)
PT: 10.9 s (ref 9.7–11.8)

## 2022-06-09 LAB — TYPE AND SCREEN
AB Screen Gel: NEGATIVE
AB Screen: NEGATIVE
ABO Rh: A NEG
ABO Rh: A NEG

## 2022-06-09 LAB — WHOLE BLOOD EMERGENCY RELEASE
Expiration Date: 202308122359
ISBT CODE: 5100
Product Status: TRANSFUSED
Unit Blood Type: O POS

## 2022-06-09 LAB — LACTIC ACID: Lactic Acid: 2.4 mmol/L — ABNORMAL HIGH (ref 0.2–2.0)

## 2022-06-09 LAB — VH I-STAT CHEM 8 NOTIFICATION

## 2022-06-09 LAB — APTT: aPTT: 23.4 s — ABNORMAL LOW (ref 24.0–34.0)

## 2022-06-09 LAB — VH EXTRA SPECIMEN LABEL

## 2022-06-09 LAB — ETHANOL (ALCOHOL) LEVEL: Alcohol: NOT DETECTED mg/dL

## 2022-06-09 MED ORDER — FENTANYL CITRATE (PF) 50 MCG/ML IJ SOLN (WRAP)
50.0000 ug | Freq: Once | INTRAMUSCULAR | Status: AC
Start: 2022-06-09 — End: 2022-06-09
  Administered 2022-06-09: 50 ug via INTRAVENOUS
  Filled 2022-06-09: qty 2

## 2022-06-09 MED ORDER — SODIUM CHLORIDE 0.9 % IV SOLN
INTRAVENOUS | Status: DC
Start: 2022-06-09 — End: 2022-06-09

## 2022-06-09 MED ORDER — HALOPERIDOL LACTATE 5 MG/ML IJ SOLN
10.0000 mg | Freq: Once | INTRAMUSCULAR | Status: AC
Start: 2022-06-09 — End: 2022-06-09
  Administered 2022-06-09: 10 mg via INTRAVENOUS

## 2022-06-09 MED ORDER — HALOPERIDOL LACTATE 5 MG/ML IJ SOLN
INTRAMUSCULAR | Status: AC
Start: 2022-06-09 — End: ?
  Filled 2022-06-09: qty 2

## 2022-06-09 MED ORDER — FENTANYL CITRATE (PF) 50 MCG/ML IJ SOLN (WRAP)
INTRAMUSCULAR | Status: AC
Start: 2022-06-09 — End: ?
  Filled 2022-06-09: qty 2

## 2022-06-09 MED ORDER — DIPHENHYDRAMINE HCL 50 MG/ML IJ SOLN
12.5000 mg | Freq: Once | INTRAMUSCULAR | Status: AC
Start: 2022-06-09 — End: 2022-06-09
  Administered 2022-06-09: 12.5 mg via INTRAVENOUS

## 2022-06-09 MED ORDER — FENTANYL CITRATE (PF) 50 MCG/ML IJ SOLN (WRAP)
50.0000 ug | Freq: Once | INTRAMUSCULAR | Status: AC
Start: 2022-06-09 — End: 2022-06-09
  Administered 2022-06-09: 50 ug via INTRAVENOUS

## 2022-06-09 MED ORDER — TETANUS-DIPHTHERIA TOXOIDS TD 5-2 LFU IM INJ
0.5000 mL | INJECTION | Freq: Once | INTRAMUSCULAR | Status: AC
Start: 2022-06-09 — End: 2022-06-09
  Administered 2022-06-09: 0.5 mL via INTRAMUSCULAR

## 2022-06-09 MED ORDER — DIPHENHYDRAMINE HCL 50 MG/ML IJ SOLN
INTRAMUSCULAR | Status: AC
Start: 2022-06-09 — End: ?
  Filled 2022-06-09: qty 1

## 2022-06-09 MED ORDER — VH FENTANYL CITRATE 100 MCG/2 ML (NARRATOR)
INTRAMUSCULAR | Status: AC | PRN
Start: 2022-06-09 — End: 2022-06-09
  Administered 2022-06-09: 50 ug via INTRAVENOUS

## 2022-06-09 MED ORDER — LET SOLUTION
TOPICAL | Status: AC
Start: 2022-06-09 — End: ?
  Filled 2022-06-09: qty 3

## 2022-06-09 MED ORDER — FENTANYL CITRATE (PF) 50 MCG/ML IJ SOLN (WRAP)
100.0000 ug | Freq: Once | INTRAMUSCULAR | Status: AC
Start: 2022-06-09 — End: 2022-06-09
  Administered 2022-06-09 (×2): 50 ug via INTRAVENOUS

## 2022-06-09 MED ORDER — IOHEXOL 350 MG/ML IV SOLN
100.0000 mL | Freq: Once | INTRAVENOUS | Status: AC | PRN
Start: 2022-06-09 — End: 2022-06-09
  Administered 2022-06-09: 100 mL via INTRAVENOUS

## 2022-06-09 MED ORDER — FENTANYL CITRATE (PF) 50 MCG/ML IJ SOLN (WRAP)
INTRAMUSCULAR | Status: AC
Start: 2022-06-09 — End: 2022-06-10
  Filled 2022-06-09: qty 2

## 2022-06-09 MED ORDER — FENTANYL CITRATE (PF) 50 MCG/ML IJ SOLN (WRAP)
INTRAMUSCULAR | Status: AC | PRN
Start: 2022-06-09 — End: 2022-06-09
  Administered 2022-06-09: 50 ug via INTRAVENOUS

## 2022-06-09 MED ORDER — VH FENTANYL CITRATE 100 MCG/2 ML (NARRATOR)
INTRAMUSCULAR | Status: AC | PRN
Start: 2022-06-09 — End: 2022-06-09
  Administered 2022-06-09: 100 ug via INTRAVENOUS

## 2022-06-09 MED ORDER — LET SOLUTION
Freq: Once | TOPICAL | Status: AC
Start: 2022-06-09 — End: 2022-06-09

## 2022-06-09 MED ORDER — TETANUS-DIPHTHERIA TOXOIDS TD 5-2 LFU IM INJ
INJECTION | INTRAMUSCULAR | Status: AC
Start: 2022-06-09 — End: ?
  Filled 2022-06-09: qty 0.5

## 2022-06-09 MED ORDER — LIDOCAINE-EPINEPHRINE 1 %-1:100000 IJ SOLN
INTRAMUSCULAR | Status: AC
Start: 2022-06-09 — End: ?
  Filled 2022-06-09: qty 20

## 2022-06-09 NOTE — ED Notes (Signed)
Dr Casimiro Needle attempting to doppler pedal pulses

## 2022-06-09 NOTE — ED Notes (Signed)
ID  band [id number] Beta to R wrist
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
## 2022-06-09 NOTE — ED Notes (Signed)
Family at beside. Family given emotional support. 
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
## 2022-06-09 NOTE — ED Notes (Signed)
Pt tearful and c/o increased leg spasms. MD aware and orders placed.

## 2022-06-09 NOTE — ED Notes (Signed)
Family updated as to patient's status.
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
## 2022-06-09 NOTE — ED Notes (Signed)
Pt daughter at bedside
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
## 2022-06-09 NOTE — ED Notes (Signed)
PT left and right legs and right arm splinted by md and report called to  pt to leave via vmt at 1500

## 2022-06-09 NOTE — Progress Notes (Signed)
Reason for Visit  This was an attempted visit following a trauma page.    Assessment  I was unable to assess needs as Ms. Urenda and her family members were actively receiving care.    Intervention  I consulted with the EMTs and the patient advocate.    Spiritual Care Plan  Chaplaincy will follow up to reassess if patient is admitted. If a chaplain is needed immediately, please page [phone].    Bevelyn Buckles, MDiv  Associate Chaplain       06/09/22 1200   Visit Type   Visit Type Attempt   Visit Source   Visit Source Trauma   Trauma Code Bravo   Present at Visit   Present at Visit Patient;Trauma Team   Spiritual Assessment   Spiritual Assessment Unable to Assess (comment)  (pt actively receiving care)   Spiritual Care Outcomes   Consulted With Other (comment)  (EMTs and patient advocate)   Length of Visit   Length of Visit(Retired) 0-15 minutes   Follow-Up   Follow-up Follow-up to reassess

## 2022-06-10 ENCOUNTER — Encounter: Payer: Self-pay | Admitting: Surgery

## 2022-06-10 ENCOUNTER — Other Ambulatory Visit: Payer: Self-pay

## 2022-06-10 DIAGNOSIS — S12191A Other nondisplaced fracture of second cervical vertebra, initial encounter for closed fracture: Secondary | ICD-10-CM

## 2022-06-10 DIAGNOSIS — Z8669 Personal history of other diseases of the nervous system and sense organs: Secondary | ICD-10-CM

## 2022-06-10 DIAGNOSIS — S52501A Unspecified fracture of the lower end of right radius, initial encounter for closed fracture: Secondary | ICD-10-CM

## 2022-06-10 DIAGNOSIS — S36115A Moderate laceration of liver, initial encounter: Secondary | ICD-10-CM

## 2022-06-10 DIAGNOSIS — D62 Acute posthemorrhagic anemia: Secondary | ICD-10-CM

## 2022-06-10 DIAGNOSIS — S82101A Unspecified fracture of upper end of right tibia, initial encounter for closed fracture: Secondary | ICD-10-CM

## 2022-06-10 LAB — CBC
Absolute NRBC: 0 10*3/uL (ref 0.00–0.00)
Hematocrit: 28 % — ABNORMAL LOW (ref 34.7–43.7)
Hgb: 9.3 g/dL — ABNORMAL LOW (ref 11.4–14.8)
MCH: 29.3 pg (ref 25.1–33.5)
MCHC: 33.2 g/dL (ref 31.5–35.8)
MCV: 88.3 fL (ref 78.0–96.0)
MPV: 10.7 fL (ref 8.9–12.5)
Nucleated RBC: 0 /100 WBC (ref 0.0–0.0)
Platelets: 266 10*3/uL (ref 142–346)
RBC: 3.17 10*6/uL — ABNORMAL LOW (ref 3.90–5.10)
RDW: 14 % (ref 11–15)
WBC: 7.6 10*3/uL (ref 3.10–9.50)

## 2022-06-10 LAB — PHOSPHORUS: Phosphorus: 4.7 mg/dL (ref 2.3–4.7)

## 2022-06-10 LAB — BASIC METABOLIC PANEL
Anion Gap: 12 (ref 5.0–15.0)
BUN: 14 mg/dL (ref 7.0–21.0)
CO2: 20 mEq/L (ref 17–29)
Calcium: 8 mg/dL — ABNORMAL LOW (ref 8.5–10.5)
Chloride: 109 mEq/L (ref 99–111)
Creatinine: 0.7 mg/dL (ref 0.4–1.0)
Glucose: 122 mg/dL — ABNORMAL HIGH (ref 70–100)
Potassium: 3.5 mEq/L (ref 3.5–5.3)
Sodium: 141 mEq/L (ref 135–145)
eGFR: >60 mL/min/{1.73_m2} (ref >=60)

## 2022-06-10 LAB — URINE DRUGS OF ABUSE SCREEN
Barbiturate Screen, UR: NEGATIVE
Benzodiazepine Screen, UR: POSITIVE — AB
Cannabinoid Screen, UR: NEGATIVE
Cocaine, UR: NEGATIVE
Opiate Screen, UR: POSITIVE — AB
PCP Screen, UR: NEGATIVE
Urine Amphetamine Screen: NEGATIVE
Urine Fentanyl: POSITIVE — AB

## 2022-06-10 LAB — LACTIC ACID: Lactic Acid: 1.2 mmol/L (ref 0.2–2.0)

## 2022-06-10 LAB — MAGNESIUM: Magnesium: 1.6 mg/dL (ref 1.6–2.6)

## 2022-06-10 MED ORDER — ACETAMINOPHEN 325 MG PO TABS
650.0000 mg | ORAL_TABLET | Freq: Four times a day (QID) | ORAL | Status: DC
Start: 2022-06-10 — End: 2022-06-18
  Administered 2022-06-10 – 2022-06-17 (×30): 650 mg via ORAL
  Filled 2022-06-10 (×30): qty 2

## 2022-06-10 MED ORDER — MAGNESIUM OXIDE 400 MG TABS (WRAP)
400.0000 mg | ORAL_TABLET | ORAL | Status: DC | PRN
Start: 2022-06-10 — End: 2022-06-11
  Administered 2022-06-10: 400 mg via ORAL
  Filled 2022-06-10: qty 1

## 2022-06-10 MED ORDER — MAGNESIUM SULFATE IN D5W 1-5 GM/100ML-% IV SOLN
1.0000 g | INTRAVENOUS | Status: DC | PRN
Start: 2022-06-10 — End: 2022-06-11

## 2022-06-10 MED ORDER — LACTATED RINGERS IV SOLN
INTRAVENOUS | Status: DC
Start: 2022-06-10 — End: 2022-06-11

## 2022-06-10 MED ORDER — ENOXAPARIN SODIUM 40 MG/0.4ML IJ SOSY
40.0000 mg | PREFILLED_SYRINGE | Freq: Two times a day (BID) | INTRAMUSCULAR | Status: DC
Start: 2022-06-10 — End: 2022-06-10
  Administered 2022-06-10 (×2): 40 mg via SUBCUTANEOUS
  Filled 2022-06-10 (×2): qty 0.4

## 2022-06-10 MED ORDER — POTASSIUM CHLORIDE 20 MEQ PO PACK
0.0000 meq | PACK | ORAL | Status: DC | PRN
Start: 2022-06-10 — End: 2022-06-11

## 2022-06-10 MED ORDER — POTASSIUM CHLORIDE CRYS ER 20 MEQ PO TBCR
0.0000 meq | EXTENDED_RELEASE_TABLET | ORAL | Status: DC | PRN
Start: 2022-06-10 — End: 2022-06-11
  Administered 2022-06-10: 40 meq via ORAL
  Filled 2022-06-10: qty 2

## 2022-06-10 MED ORDER — FAMOTIDINE 10 MG/ML IV SOLN (WRAP)
20.0000 mg | Freq: Two times a day (BID) | INTRAVENOUS | Status: DC
Start: 2022-06-10 — End: 2022-06-12
  Administered 2022-06-10 (×3): 20 mg via INTRAVENOUS
  Filled 2022-06-10 (×3): qty 2

## 2022-06-10 MED ORDER — SODIUM PHOSPHATES 3 MMOLE/ML IV SOLN (WRAP)
15.0000 mmol | INTRAVENOUS | Status: DC | PRN
Start: 2022-06-10 — End: 2022-06-11

## 2022-06-10 MED ORDER — OXYCODONE HCL 5 MG PO TABS
5.0000 mg | ORAL_TABLET | ORAL | Status: DC | PRN
Start: 2022-06-10 — End: 2022-06-29
  Administered 2022-06-10 – 2022-06-24 (×18): 5 mg via ORAL
  Filled 2022-06-10 (×18): qty 1

## 2022-06-10 MED ORDER — GABAPENTIN 100 MG PO CAPS
200.0000 mg | ORAL_CAPSULE | Freq: Three times a day (TID) | ORAL | Status: DC
Start: 2022-06-10 — End: 2022-06-20
  Administered 2022-06-10 – 2022-06-20 (×29): 200 mg via ORAL
  Filled 2022-06-10 (×31): qty 2

## 2022-06-10 MED ORDER — ENOXAPARIN SODIUM 60 MG/0.6ML IJ SOSY
0.5000 mg/kg | PREFILLED_SYRINGE | Freq: Two times a day (BID) | INTRAMUSCULAR | Status: DC
Start: 2022-06-10 — End: 2022-06-12
  Administered 2022-06-10 – 2022-06-12 (×4): 60 mg via SUBCUTANEOUS
  Filled 2022-06-10 (×4): qty 0.6

## 2022-06-10 MED ORDER — FAMOTIDINE 20 MG PO TABS
20.0000 mg | ORAL_TABLET | Freq: Two times a day (BID) | ORAL | Status: DC
Start: 2022-06-10 — End: 2022-06-12
  Administered 2022-06-11 (×2): 20 mg via ORAL
  Filled 2022-06-10 (×2): qty 1

## 2022-06-10 MED ORDER — OXYCODONE HCL 10 MG PO TABS
10.0000 mg | ORAL_TABLET | ORAL | Status: DC | PRN
Start: 2022-06-10 — End: 2022-06-29
  Administered 2022-06-12 – 2022-06-29 (×52): 10 mg via ORAL
  Filled 2022-06-10 (×54): qty 1

## 2022-06-10 MED ORDER — SODIUM PHOSPHATES 3 MMOLE/ML IV SOLN (WRAP)
35.0000 mmol | INTRAVENOUS | Status: DC | PRN
Start: 2022-06-10 — End: 2022-06-11

## 2022-06-10 MED ORDER — SODIUM PHOSPHATES 3 MMOLE/ML IV SOLN (WRAP)
25.0000 mmol | INTRAVENOUS | Status: DC | PRN
Start: 2022-06-10 — End: 2022-06-11

## 2022-06-10 MED ORDER — POTASSIUM CHLORIDE 10 MEQ/100ML IV SOLN (WRAP)
10.0000 meq | INTRAVENOUS | Status: DC | PRN
Start: 2022-06-10 — End: 2022-06-11

## 2022-06-10 MED ORDER — ROTIGOTINE 3 MG/24HR TD PT24
3.0000 mg | MEDICATED_PATCH | TRANSDERMAL | Status: DC
Start: 2022-06-10 — End: 2022-06-29
  Administered 2022-06-10 – 2022-06-29 (×19): 3 mg via TRANSDERMAL
  Filled 2022-06-10 (×23): qty 1

## 2022-06-10 MED ORDER — ONDANSETRON HCL 4 MG PO TABS
4.0000 mg | ORAL_TABLET | Freq: Three times a day (TID) | ORAL | Status: DC | PRN
Start: 2022-06-10 — End: 2022-06-29
  Administered 2022-06-13 – 2022-06-18 (×2): 4 mg via ORAL
  Filled 2022-06-10 (×2): qty 1

## 2022-06-10 MED ORDER — ONDANSETRON HCL 4 MG/2ML IJ SOLN
4.0000 mg | Freq: Three times a day (TID) | INTRAMUSCULAR | Status: DC | PRN
Start: 2022-06-10 — End: 2022-06-29
  Administered 2022-06-15 – 2022-06-29 (×4): 4 mg via INTRAVENOUS
  Filled 2022-06-10 (×5): qty 2

## 2022-06-10 MED ORDER — HYDROMORPHONE HCL 0.5 MG/0.5 ML IJ SOLN
0.5000 mg | Freq: Once | INTRAMUSCULAR | Status: AC
Start: 2022-06-10 — End: 2022-06-10
  Administered 2022-06-10: 0.5 mg via INTRAVENOUS
  Filled 2022-06-10: qty 1

## 2022-06-10 MED ORDER — SENNOSIDES-DOCUSATE SODIUM 8.6-50 MG PO TABS
1.0000 | ORAL_TABLET | Freq: Two times a day (BID) | ORAL | Status: DC
Start: 2022-06-10 — End: 2022-06-21
  Administered 2022-06-11 – 2022-06-20 (×18): 1 via ORAL
  Filled 2022-06-10 (×19): qty 1

## 2022-06-10 MED ORDER — METHOCARBAMOL 1000 MG/10ML IJ SOLN
1000.0000 mg | Freq: Three times a day (TID) | INTRAVENOUS | Status: DC
Start: 2022-06-10 — End: 2022-06-12
  Administered 2022-06-10 – 2022-06-12 (×6): 1000 mg via INTRAVENOUS
  Filled 2022-06-10 (×10): qty 10

## 2022-06-10 MED ORDER — HYDRALAZINE HCL 20 MG/ML IJ SOLN
10.0000 mg | Freq: Four times a day (QID) | INTRAMUSCULAR | Status: DC | PRN
Start: 2022-06-10 — End: 2022-06-29
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
## 2022-06-10 NOTE — Progress Notes (Signed)
4 eyes in 4 hours pressure injury assessment note:      Completed with: Floor RN  Unit & Time admitted: STICU 0030              Bony Prominences: Check appropriate box; if wound is present enter wound assessment in LDA     Occiput:                 [x] WNL  []  Wound present  Face:                     [] WNL  [x]  Wound present  Ears:                      [x] WNL  []  Wound present  Spine:                    [x] WNL  []  Wound present  Shoulders:             [x] WNL  []  Wound present  Elbows:                  [x] WNL  []  Wound present  Sacrum/coccyx:     [x] WNL  []  Wound present  Ischial Tuberosity:  [x] WNL  []  Wound present  Trochanter/Hip:      [x] WNL  []  Wound present  Knees:                   [] WNL  [x]  Wound present  Ankles:                   [x] WNL  []  Wound present  Heels:                    [x] WNL  []  Wound present  Other pressure areas:  []  Wound location       Device related: []  Device name:         LDA completed if wound present: yes  Consult WOCN if necessary    Other skin related issues, ie tears, rash, etc, document in Integumentary flowsheet    Laceration to R forehead   Abrasion to L eyelid, L knee and scattered  Immobilizer to RLE  Traction  to LLE  Splint to RUE   Lap sites to abdomen

## 2022-06-10 NOTE — Plan of Care (Signed)
VSS.  Pt pain and spasms controlled w/ scheduled and prn pain meds.  LLE to traction.  Pt pulses palpable and capp refill WDL.       Problem: Impaired Mobility  Goal: Mobility/Activity is maintained at optimal level for patient  Outcome: Not Progressing     Problem: Moderate/High Fall Risk Score >5  Goal: Patient will remain free of falls  Outcome: Progressing     Problem: Pain interferes with ability to perform ADL  Goal: Pain at adequate level as identified by patient  Outcome: Progressing     Problem: Side Effects from Pain Analgesia  Goal: Patient will experience minimal side effects of analgesic therapy  Outcome: Progressing     Problem: Compromised Hemodynamic Status  Goal: Vital signs and fluid balance maintained/improved  Outcome: Progressing     Problem: Peripheral Neurovascular Impairment  Goal: Extremity color, movement, sensation are maintained or improved  Outcome: Progressing     Problem: Compromised skin integrity  Goal: Skin integrity is maintained or improved  Outcome: Progressing     Problem: Nutrition  Goal: Nutritional intake is adequate  Outcome: Progressing

## 2022-06-10 NOTE — Plan of Care (Signed)
Problem: Moderate/High Fall Risk Score >5  Goal: Patient will remain free of falls  Outcome: Progressing     Problem: Pain interferes with ability to perform ADL  Goal: Pain at adequate level as identified by patient  Outcome: Progressing  Flowsheets (Taken 06/10/2022 2249)  Pain at adequate level as identified by patient:   Identify patient comfort function goal   Assess for risk of opioid induced respiratory depression, including snoring/sleep apnea. Alert healthcare team of risk factors identified.   Assess pain on admission, during daily assessment and/or before any "as needed" intervention(s)   Evaluate if patient comfort function goal is met   Reassess pain within 30-60 minutes of any procedure/intervention, per Pain Assessment, Intervention, Reassessment (AIR) Cycle   Evaluate patient's satisfaction with pain management progress   Offer non-pharmacological pain management interventions   Include patient/patient care companion in decisions related to pain management as needed     Problem: Side Effects from Pain Analgesia  Goal: Patient will experience minimal side effects of analgesic therapy  Outcome: Progressing  Flowsheets (Taken 06/10/2022 2249)  Patient will experience minimal side effects of analgesic therapy:   Assess for changes in cognitive function   Monitor/assess patient's respiratory status (RR depth, effort, breath sounds)   Prevent/manage side effects per LIP orders (i.e. nausea, vomiting, pruritus, constipation, urinary retention, etc.)   Evaluate for opioid-induced sedation with appropriate assessment tool (i.e. POSS)     Problem: Compromised Hemodynamic Status  Goal: Vital signs and fluid balance maintained/improved  Outcome: Progressing  Flowsheets (Taken 06/10/2022 2249)  Vital signs and fluid balance are maintained/improved:   Position patient for maximum circulation/cardiac output   Monitor/assess vitals and hemodynamic parameters with position changes   Monitor intake and output. Notify LIP  if urine output is less than 30 mL/hour.   Monitor/assess lab values and report abnormal values     Problem: Impaired Mobility  Goal: Mobility/Activity is maintained at optimal level for patient  Outcome: Progressing  Flowsheets (Taken 06/10/2022 2249)  Mobility/activity is maintained at optimal level for patient:   Increase mobility as tolerated/progressive mobility   Maintain proper body alignment   Perform active/passive ROM   Reposition patient every 2 hours and as needed unless able to reposition self   Plan activities to conserve energy, plan rest periods   Assess for changes in respiratory status, level of consciousness and/or development of fatigue   Consult/collaborate with Physical Therapy and/or Occupational Therapy     Problem: Peripheral Neurovascular Impairment  Goal: Extremity color, movement, sensation are maintained or improved  Outcome: Progressing  Flowsheets (Taken 06/10/2022 2249)  Extremity color, movement, sensation are maintained or improved:   Increase mobility as tolerated/progressive mobility   Assess and monitor application of corrective devices (cast, brace, splint), check skin integrity   Teach/review/reinforce ankle pump exercises   Assess extremity for proper alignment   VTE Prevention: Administer anticoagulant(s) and/or apply anti-embolism stockings/devices as ordered     Problem: Compromised skin integrity  Goal: Skin integrity is maintained or improved  Outcome: Progressing  Flowsheets (Taken 06/10/2022 2249)  Skin integrity is maintained or improved:   Assess Braden Scale every shift   Turn or reposition patient every 2 hours or as needed unless able to reposition self   Increase activity as tolerated/progressive mobility   Relieve pressure to bony prominences   Avoid shearing   Keep skin clean and dry   Encourage use of lotion/moisturizer on skin   Monitor patient's hygiene practices   Keep head of bed  30 degrees or less (unless contraindicated)     Problem: Nutrition  Goal:  Nutritional intake is adequate  Outcome: Progressing  Flowsheets (Taken 06/10/2022 2249)  Nutritional intake is adequate:   Monitor daily weights   Encourage/perform oral hygiene as appropriate   Allow adequate time for meals     Problem: Compromised Tissue integrity  Goal: Damaged tissue is healing and protected  Outcome: Progressing  Flowsheets (Taken 06/10/2022 2249)  Damaged tissue is healing and protected:   Monitor/assess Braden scale every shift   Reposition patient every 2 hours and as needed unless able to reposition self   Relieve pressure to bony prominences for patients at moderate and high risk   Avoid shearing injuries   Keep intact skin clean and dry   Increase activity as tolerated/progressive mobility   Use incontinence wipes for cleaning urine, stool and caustic drainage. Foley care as needed   Use bath wipes, not soap and water, for daily bathing   Monitor external devices/tubes for correct placement to prevent pressure, friction and shearing   Encourage use of lotion/moisturizer on skin   Monitor patient's hygiene practices  Goal: Nutritional status is improving  Outcome: Progressing  Flowsheets (Taken 06/10/2022 2249)  Nutritional status is improving:   Allow adequate time for meals   Encourage patient to take dietary supplement(s) as ordered   Include patient/patient care companion in decisions related to nutrition   Collaborate with Clinical Nutritionist

## 2022-06-10 NOTE — Progress Notes (Addendum)
Name: Laura Mclaughlin     MRN: [id number]    Ht: Height: 175.3 cm (5\' 9" )  Wt:Weight: 125.1 kg (275 lb 12.7 oz) Adm Date: 06/09/2022     Affected side: spine    Reason for Visit: fitting and delivery    Observations: Seen at bedside. TACS monitoring, LLE traction. PIV    Today's Session: fit and delivered    Brace Issued: Aspen CTO     Ordering Physician: Arlean Hopping MD     Delivery Receipt Signed by:   P{'s Daughter        Patient/Caretaker is able to properly don and doff device - Yes    Next Appointment: PRN         Seen by: Newt Lukes         [address]  [place], [place] [address]  [phone]
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
## 2022-06-10 NOTE — Plan of Care (Signed)
Problem: Pain interferes with ability to perform ADL  Goal: Pain at adequate level as identified by patient  Outcome: Progressing  Flowsheets (Taken 06/10/2022 1555)  Pain at adequate level as identified by patient:   Identify patient comfort function goal   Assess for risk of opioid induced respiratory depression, including snoring/sleep apnea. Alert healthcare team of risk factors identified.   Assess pain on admission, during daily assessment and/or before any "as needed" intervention(s)   Reassess pain within 30-60 minutes of any procedure/intervention, per Pain Assessment, Intervention, Reassessment (AIR) Cycle   Evaluate if patient comfort function goal is met   Evaluate patient's satisfaction with pain management progress   Offer non-pharmacological pain management interventions   Consult/collaborate with Pain Service   Consult/collaborate with Physical Therapy, Occupational Therapy, and/or Speech Therapy   Include patient/patient care companion in decisions related to pain management as needed     Problem: Impaired Mobility  Goal: Mobility/Activity is maintained at optimal level for patient  Outcome: Progressing  Flowsheets (Taken 06/10/2022 1555)  Mobility/activity is maintained at optimal level for patient:   Increase mobility as tolerated/progressive mobility   Encourage independent activity per ability   Maintain proper body alignment   Perform active/passive ROM   Plan activities to conserve energy, plan rest periods   Reposition patient every 2 hours and as needed unless able to reposition self   Assess for changes in respiratory status, level of consciousness and/or development of fatigue   Consult/collaborate with Physical Therapy and/or Occupational Therapy     Problem: Nutrition  Goal: Nutritional intake is adequate  Outcome: Progressing  Flowsheets (Taken 06/10/2022 1555)  Nutritional intake is adequate:   Monitor daily weights   Assist patient with meals/food selection   Allow adequate time for  meals   Encourage/perform oral hygiene as appropriate   Consult/collaborate with Clinical Nutritionist   Include patient/patient care companion in decisions related to nutrition

## 2022-06-11 ENCOUNTER — Encounter: Payer: Self-pay | Admitting: Surgery

## 2022-06-11 ENCOUNTER — Inpatient Hospital Stay: Payer: Medicare Other | Admitting: Anesthesiology

## 2022-06-11 ENCOUNTER — Inpatient Hospital Stay: Payer: Medicare Other

## 2022-06-11 ENCOUNTER — Other Ambulatory Visit (INDEPENDENT_AMBULATORY_CARE_PROVIDER_SITE_OTHER): Payer: Self-pay | Admitting: Student in an Organized Health Care Education/Training Program

## 2022-06-11 ENCOUNTER — Other Ambulatory Visit: Payer: Self-pay

## 2022-06-11 ENCOUNTER — Encounter
Admission: AC | Disposition: A | Discharge status: Rehabilitation Facility | Payer: Self-pay | Source: Other Acute Inpatient Hospital | Attending: Surgery

## 2022-06-11 DIAGNOSIS — S72492A Other fracture of lower end of left femur, initial encounter for closed fracture: Secondary | ICD-10-CM

## 2022-06-11 DIAGNOSIS — S72402A Unspecified fracture of lower end of left femur, initial encounter for closed fracture: Secondary | ICD-10-CM

## 2022-06-11 DIAGNOSIS — S12100A Unspecified displaced fracture of second cervical vertebra, initial encounter for closed fracture: Secondary | ICD-10-CM | POA: Diagnosis present

## 2022-06-11 DIAGNOSIS — T794XXA Traumatic shock, initial encounter: Secondary | ICD-10-CM | POA: Diagnosis present

## 2022-06-11 DIAGNOSIS — S52571A Other intraarticular fracture of lower end of right radius, initial encounter for closed fracture: Secondary | ICD-10-CM | POA: Diagnosis present

## 2022-06-11 DIAGNOSIS — S301XXA Contusion of abdominal wall, initial encounter: Secondary | ICD-10-CM | POA: Diagnosis present

## 2022-06-11 DIAGNOSIS — G8911 Acute pain due to trauma: Secondary | ICD-10-CM | POA: Diagnosis present

## 2022-06-11 DIAGNOSIS — S82141A Displaced bicondylar fracture of right tibia, initial encounter for closed fracture: Secondary | ICD-10-CM | POA: Diagnosis present

## 2022-06-11 HISTORY — PX: ORIF, FEMUR, DISTAL: SHX4880

## 2022-06-11 HISTORY — PX: OPEN TREATMENT, FEMORAL FRACTURE, DISTAL, MEDIAL OR LATERAL CONDYLE, INTERNAL FIXATION: SHX4880

## 2022-06-11 HISTORY — DX: Traumatic shock, initial encounter: T79.4XXA

## 2022-06-11 LAB — CULTURE, METHICILLIN RESISTANT STAPHYLOCOCCUS AUREUS (MRSA)
Culture MRSA Surveillance: NEGATIVE
Culture MRSA Surveillance: NEGATIVE

## 2022-06-11 LAB — BASIC METABOLIC PANEL
Anion Gap: 8 (ref 5.0–15.0)
BUN: 9 mg/dL (ref 7.0–21.0)
CO2: 23 mEq/L (ref 17–29)
Calcium: 8.4 mg/dL — ABNORMAL LOW (ref 8.5–10.5)
Chloride: 104 mEq/L (ref 99–111)
Creatinine: 0.7 mg/dL (ref 0.4–1.0)
Glucose: 94 mg/dL (ref 70–100)
Potassium: 4 mEq/L (ref 3.5–5.3)
Sodium: 135 mEq/L (ref 135–145)
eGFR: >60 mL/min/{1.73_m2} (ref >=60)

## 2022-06-11 LAB — ECG 12-LEAD
P Wave Axis: 65 deg
P-R Interval: 159 ms
Patient Age: 61 years
Q-T Interval(Corrected): 444 ms
Q-T Interval: 403 ms
QRS Axis: 4 deg
QRS Duration: 81 ms
T Axis: 34 years
Ventricular Rate: 73 //min

## 2022-06-11 LAB — CBC
Absolute NRBC: 0 10*3/uL (ref 0.00–0.00)
Hematocrit: 27.8 % — ABNORMAL LOW (ref 34.7–43.7)
Hgb: 9 g/dL — ABNORMAL LOW (ref 11.4–14.8)
MCH: 29.5 pg (ref 25.1–33.5)
MCHC: 32.4 g/dL (ref 31.5–35.8)
MCV: 91.1 fL (ref 78.0–96.0)
MPV: 10.5 fL (ref 8.9–12.5)
Nucleated RBC: 0 /100 WBC (ref 0.0–0.0)
Platelets: 266 10*3/uL (ref 142–346)
RBC: 3.05 10*6/uL — ABNORMAL LOW (ref 3.90–5.10)
RDW: 14 % (ref 11–15)
WBC: 7.66 10*3/uL (ref 3.10–9.50)

## 2022-06-11 LAB — PHOSPHORUS: Phosphorus: 2.9 mg/dL (ref 2.3–4.7)

## 2022-06-11 LAB — MAGNESIUM: Magnesium: 1.7 mg/dL (ref 1.6–2.6)

## 2022-06-11 SURGERY — OPEN TREATMENT, FEMORAL FRACTURE, DISTAL, MEDIAL OR LATERAL CONDYLE, INTERNAL FIXATION
Anesthesia: Anesthesia General | Site: Leg Upper | Laterality: Left | Wound class: Clean

## 2022-06-11 MED ORDER — PROPOFOL INFUSION 10 MG/ML
INTRAVENOUS | Status: DC | PRN
Start: 2022-06-11 — End: 2022-06-11
  Administered 2022-06-11: 50 ug/kg/min via INTRAVENOUS
  Administered 2022-06-11: 200 mg via INTRAVENOUS

## 2022-06-11 MED ORDER — BISACODYL 10 MG RE SUPP
10.0000 mg | Freq: Every day | RECTAL | Status: DC | PRN
Start: 2022-06-11 — End: 2022-06-29
  Administered 2022-06-27: 10 mg via RECTAL
  Filled 2022-06-11 (×2): qty 1

## 2022-06-11 MED ORDER — LABETALOL HCL 5 MG/ML IV SOLN (WRAP)
10.0000 mg | INTRAVENOUS | Status: DC | PRN
Start: 2022-06-11 — End: 2022-06-11

## 2022-06-11 MED ORDER — LIDOCAINE HCL (PF) 1 % IJ SOLN
INTRAMUSCULAR | Status: DC | PRN
Start: 2022-06-11 — End: 2022-06-11
  Administered 2022-06-11: 100 mg via INTRAVENOUS

## 2022-06-11 MED ORDER — SODIUM CHLORIDE (PF) 0.9 % IJ SOLN
INTRAMUSCULAR | Status: AC
Start: 2022-06-11 — End: ?
  Filled 2022-06-11: qty 10

## 2022-06-11 MED ORDER — POLYETHYLENE GLYCOL 3350 17 G PO PACK
17.0000 g | PACK | Freq: Every day | ORAL | Status: DC
Start: 2022-06-11 — End: 2022-06-29
  Administered 2022-06-12 – 2022-06-29 (×13): 17 g via ORAL
  Filled 2022-06-11 (×15): qty 1

## 2022-06-11 MED ORDER — BUPIVACAINE LIPOSOME 1.3 % IJ SUSP
INTRAMUSCULAR | Status: AC
Start: 2022-06-11 — End: ?
  Filled 2022-06-11: qty 20

## 2022-06-11 MED ORDER — FENTANYL CITRATE (PF) 50 MCG/ML IJ SOLN (WRAP)
INTRAMUSCULAR | Status: AC
Start: 2022-06-11 — End: ?
  Filled 2022-06-11: qty 2

## 2022-06-11 MED ORDER — ACETAMINOPHEN 500 MG PO TABS
1000.0000 mg | ORAL_TABLET | Freq: Once | ORAL | Status: DC | PRN
Start: 2022-06-11 — End: 2022-06-11

## 2022-06-11 MED ORDER — BUPIVACAINE HCL (PF) 0.5 % IJ SOLN
INTRAMUSCULAR | Status: AC
Start: 2022-06-11 — End: ?
  Filled 2022-06-11: qty 30

## 2022-06-11 MED ORDER — CEFAZOLIN SODIUM 1 G IJ SOLR
INTRAMUSCULAR | Status: AC
Start: 2022-06-11 — End: ?
  Filled 2022-06-11: qty 1000

## 2022-06-11 MED ORDER — STERILE WATER FOR INJECTION IJ/IV SOLN (WRAP)
2.0000 g | Freq: Once | INTRAMUSCULAR | Status: DC
Start: 2022-06-11 — End: 2022-06-15

## 2022-06-11 MED ORDER — ROCURONIUM BROMIDE 50 MG/5ML IV SOLN
INTRAVENOUS | Status: AC
Start: 2022-06-11 — End: ?
  Filled 2022-06-11: qty 5

## 2022-06-11 MED ORDER — STERILE WATER FOR INJECTION IJ/IV SOLN (WRAP)
2.0000 g | Freq: Once | INTRAMUSCULAR | Status: AC
Start: 2022-06-11 — End: 2022-06-11
  Administered 2022-06-11: 3 g via INTRAVENOUS

## 2022-06-11 MED ORDER — SODIUM CHLORIDE 0.9 % IR SOLN
Status: DC | PRN
Start: 2022-06-11 — End: 2022-06-11
  Administered 2022-06-11: 1000 mL

## 2022-06-11 MED ORDER — HYDROMORPHONE HCL 0.5 MG/0.5 ML IJ SOLN
0.5000 mg | INTRAMUSCULAR | Status: DC | PRN
Start: 2022-06-11 — End: 2022-06-11

## 2022-06-11 MED ORDER — ONDANSETRON HCL 4 MG/2ML IJ SOLN
INTRAMUSCULAR | Status: DC | PRN
Start: 2022-06-11 — End: 2022-06-11
  Administered 2022-06-11: 4 mg via INTRAVENOUS

## 2022-06-11 MED ORDER — ROPIVACAINE HCL 5 MG/ML IJ SOLN
INTRAMUSCULAR | Status: AC
Start: 2022-06-11 — End: ?
  Filled 2022-06-11: qty 30

## 2022-06-11 MED ORDER — TOPIRAMATE 25 MG PO TABS
150.0000 mg | ORAL_TABLET | Freq: Two times a day (BID) | ORAL | Status: DC
Start: 2022-06-11 — End: 2022-06-29
  Administered 2022-06-12 – 2022-06-29 (×34): 150 mg via ORAL
  Filled 2022-06-11 (×41): qty 2

## 2022-06-11 MED ORDER — METOCLOPRAMIDE HCL 5 MG/ML IJ SOLN
10.0000 mg | Freq: Once | INTRAMUSCULAR | Status: DC | PRN
Start: 2022-06-11 — End: 2022-06-11

## 2022-06-11 MED ORDER — MEPERIDINE HCL 25 MG/ML IJ SOLN
12.5000 mg | INTRAMUSCULAR | Status: DC | PRN
Start: 2022-06-11 — End: 2022-06-11

## 2022-06-11 MED ORDER — SUGAMMADEX SODIUM 200 MG/2ML IV SOLN
INTRAVENOUS | Status: DC | PRN
Start: 2022-06-11 — End: 2022-06-11
  Administered 2022-06-11: 200 mg via INTRAVENOUS

## 2022-06-11 MED ORDER — PROPOFOL 10 MG/ML IV EMUL (WRAP)
INTRAVENOUS | Status: AC
Start: 2022-06-11 — End: ?
  Filled 2022-06-11: qty 20

## 2022-06-11 MED ORDER — FENTANYL CITRATE (PF) 50 MCG/ML IJ SOLN (WRAP)
INTRAMUSCULAR | Status: DC | PRN
Start: 2022-06-11 — End: 2022-06-11
  Administered 2022-06-11: 100 ug via INTRAVENOUS

## 2022-06-11 MED ORDER — ONDANSETRON HCL 4 MG/2ML IJ SOLN
4.0000 mg | Freq: Once | INTRAMUSCULAR | Status: DC | PRN
Start: 2022-06-11 — End: 2022-06-11

## 2022-06-11 MED ORDER — LIDOCAINE HCL (PF) 2 % IJ SOLN
INTRAMUSCULAR | Status: AC
Start: 2022-06-11 — End: ?
  Filled 2022-06-11: qty 5

## 2022-06-11 MED ORDER — DEXAMETHASONE SODIUM PHOSPHATE 20 MG/5ML IJ SOLN
INTRAMUSCULAR | Status: AC
Start: 2022-06-11 — End: ?
  Filled 2022-06-11: qty 5

## 2022-06-11 MED ORDER — CEFAZOLIN SODIUM-DEXTROSE 2-3 GM-%(50ML) IV SOLR
INTRAVENOUS | Status: AC
Start: 2022-06-11 — End: ?
  Filled 2022-06-11: qty 50

## 2022-06-11 MED ORDER — ONDANSETRON HCL 4 MG/2ML IJ SOLN
INTRAMUSCULAR | Status: AC
Start: 2022-06-11 — End: ?
  Filled 2022-06-11: qty 2

## 2022-06-11 MED ORDER — FENTANYL CITRATE (PF) 50 MCG/ML IJ SOLN (WRAP)
25.0000 ug | INTRAMUSCULAR | Status: DC | PRN
Start: 2022-06-11 — End: 2022-06-11
  Administered 2022-06-11: 25 ug via INTRAVENOUS

## 2022-06-11 MED ORDER — HYDROCODONE-ACETAMINOPHEN 5-325 MG PO TABS
ORAL_TABLET | ORAL | Status: AC
Start: 2022-06-11 — End: 2022-06-11
  Administered 2022-06-11: 2 via ORAL
  Filled 2022-06-11: qty 2

## 2022-06-11 MED ORDER — HYDROCODONE-ACETAMINOPHEN 5-325 MG PO TABS
2.0000 | ORAL_TABLET | Freq: Once | ORAL | Status: AC | PRN
Start: 2022-06-11 — End: 2022-06-11

## 2022-06-11 MED ORDER — ROCURONIUM BROMIDE 10 MG/ML IV SOLN (WRAP)
INTRAVENOUS | Status: DC | PRN
Start: 2022-06-11 — End: 2022-06-11
  Administered 2022-06-11 (×2): 10 mg via INTRAVENOUS
  Administered 2022-06-11: 50 mg via INTRAVENOUS
  Administered 2022-06-11: 20 mg via INTRAVENOUS

## 2022-06-11 MED ORDER — FENTANYL CITRATE (PF) 50 MCG/ML IJ SOLN (WRAP)
INTRAMUSCULAR | Status: AC
Start: 2022-06-11 — End: 2022-06-11
  Administered 2022-06-11: 25 ug via INTRAVENOUS
  Filled 2022-06-11: qty 2

## 2022-06-11 MED ORDER — DEXAMETHASONE SODIUM PHOSPHATE 4 MG/ML IJ SOLN (WRAP)
INTRAMUSCULAR | Status: DC | PRN
Start: 2022-06-11 — End: 2022-06-11
  Administered 2022-06-11: 4 mg via INTRAVENOUS

## 2022-06-11 MED ORDER — HYDROMORPHONE HCL 0.5 MG/0.5 ML IJ SOLN
0.4000 mg | Freq: Once | INTRAMUSCULAR | Status: AC
Start: 2022-06-11 — End: 2022-06-11
  Administered 2022-06-11: 0.4 mg via INTRAVENOUS
  Filled 2022-06-11: qty 1

## 2022-06-11 MED ORDER — HYDRALAZINE HCL 20 MG/ML IJ SOLN
10.0000 mg | INTRAMUSCULAR | Status: AC
Start: 2022-06-11 — End: 2022-06-11

## 2022-06-11 SURGICAL SUPPLY — 118 items
APPLICATOR CHLORAPREP 26 ML 70% ISOPROPYL ALCOHOL 2% CHLORHEXIDINE (Applicator) ×3 IMPLANT
APPLICATOR PRP 70% ISPRP 2% CHG 26ML (Applicator) ×6
BAG EQP 28X36IN BAND (Other) ×2
BAG EQUIPMENT L28 IN X W36 IN BAND (Other) ×1 IMPLANT
BANDAGE GAUZE L3.6 YD X W3.4 IN 6 PLY ABSORBENT STRETCH TIGHT FINISH (Bandage) ×1 IMPLANT
BANDAGE GZE CTTN BLK2 3.6YDX3.4IN LF (Bandage) ×2
BANDAGE MEDLINE GAUZE L3.6 YD X W3.4 IN (Bandage) ×1
BIT DRILL L110 MM OD1.5 MM QUICK COUPLING (Drillbits) ×1 IMPLANT
BIT DRILL L110 MM OD1.5 MM SYNTHES QUICK (Drillbits) ×1
BIT DRILL L180 MM OD2.5 MM 3 FLUTE (Drillbits) ×1
BIT DRILL L180 MM OD2.5 MM 3 FLUTE QUICK COUPLING (Drillbits) ×1 IMPLANT
BIT DRILL L300 MM OD3.2 MM PERCUTANEOUS QUICK COUPLING CALIBRATE (Drillbits) ×1 IMPLANT
BIT DRILL L300 MM OD3.2 MM SYNTHES (Drillbits) ×1
BIT DRILL L300 MM OD4.3 MM PERCUTANEOUS QUICK COUPLING CALIBRATE (Drillbits) ×1 IMPLANT
BIT DRILL L300 MM OD4.3 MM SYNTHES (Drillbits) ×1
BIT DRL 1.5MM 110MM NS QC (Drillbits) ×2
BIT DRL 3 FLUT 2.5MM 180MM NS QC (Drillbits) ×2
BIT DRL 3.2MM 300MM QC CLBRT PERC (Drillbits) ×2
BIT DRL 4.3MM 300MM NS QC CLBRT PERC (Drillbits) ×2
COVER FLEXIBLE LIGHT HANDLE PLASTIC GREEN (Procedure Accessories) ×3 IMPLANT
COVER FLEXIBLE MEDLINE LIGHT HANDLE (Procedure Accessories) ×3
COVER LGHT HNDL PLS LF STRL FLXB DISP (Procedure Accessories) ×6
COVER TABLE L90 IN X W65 IN REINFORCE (Drape)
COVER TABLE L90 IN X W65 IN REINFORCE HEAVY DUTY CONVERTORS POLY (Drape) IMPLANT
COVER TBL POLY CNVRT 90X65IN STRL REINF (Drape)
DRAPE EQP C-ARMOR STRL XPD CLPSBL CARM (Drape)
DRAPE EXPAND COLLAPSIBLE EQUIPMENT C-ARMOR C ARM FLUOROSCOPE STERILE (Drape) IMPLANT
DRAPE SRG SMS 108X77IN LF STRL ULT LO (Drape) ×4
DRAPE SRG TBRN XL CNVRT 98X77IN LF STRL (Drape) ×4
DRAPE SURGICAL SHEET FANFOLD L98 IN X (Drape) ×2
DRAPE SURGICAL SHEET FANFOLD L98 IN X W77 IN TIBURON XL PINK (Drape) ×2 IMPLANT
DRAPE SURGICAL ULTRA LOW LINT PUNCTURE (Drape) ×2
DRAPE SURGICAL ULTRAGARD SPLIT L108 IN X W77 IN SMS (Drape) ×2 IMPLANT
DRESSING PETRO 3% BI 3BRM GZE XR 8X1IN (Dressing) ×2
DRESSING PETRO 3% BI 3BRM GZE XR 9X5IN (Dressing) ×2
DRESSING PETROLATUM L9 IN X W5 IN NONADHESIVE OCCLUSIVE BACTERIOSTATIC (Dressing) ×1 IMPLANT
DRESSING PETROLATUM XEROFORM L8 IN X W1 (Dressing) ×1
DRESSING PETROLATUM XEROFORM L8 IN X W1 IN 3% BISMUTH TRIBROMOPHENATE (Dressing) ×1 IMPLANT
DRESSING TEGADERM 2X2 3/4 (Dressing) ×2 IMPLANT
DRESSING TRANSPARENT L4 3/4 IN X W4 IN (Dressing) ×6
DRESSING TRANSPARENT L4 3/4 IN X W4 IN POLYURETHANE ADHESIVE (Dressing) ×6 IMPLANT
DRESSING TRNS PU STD TGDRM 4.75X4IN LF (Dressing) ×12
ELECTRODE ADULT PATIENT RETURN L9 FT REM POLYHESIVE ACRYLIC FOAM (Procedure Accessories) ×1 IMPLANT
ELECTRODE PATIENT RETURN L9 FT VALLEYLAB (Procedure Accessories) ×1
ELECTRODE PT RTN RM PHSV ACRL FM C30- LB (Procedure Accessories) ×2
GAUZE PADS 4 X 4 (Dressing) ×2
GLOVE SRG PLISPRN 8 BGL PI ORTHOPRO 309 (Glove) ×4
GLOVE SRG PLISPRN 8.5 BGL PI INDCTR (Glove) ×2
GLOVE SURGICAL 8 1/2 BIOGEL PI INDICATOR (Glove) ×1
GLOVE SURGICAL 8 1/2 BIOGEL PI INDICATOR UNDERGLOVE POWDER FREE SMOOTH (Glove) ×1 IMPLANT
GLOVE SURGICAL 8 BIOGEL PI ORTHOPRO (Glove) ×2
GLOVE SURGICAL 8 BIOGEL PI ORTHOPRO POWDER FREE MICRO ROUGH TEXTURE (Glove) ×2 IMPLANT
GUIDEWIRE ORTH 2.5MM 300MM NS DRL TIP (Guide Wire) ×4
GUIDEWIRE ORTH 3.2MM 400MM NS THRD (Guide Wire) ×2
GUIDEWIRE ORTH SS LCP 2.5MM 200MM NS DRL (Guide Wire) ×10
GUIDEWIRE ORTHOPEDIC L200 MM DRILL TIP OD2.5 MM STAINLESS STEEL (Guide Wire) ×5 IMPLANT
GUIDEWIRE ORTHOPEDIC L300 MM DRILL TIP OD2.5 MM (Guide Wire) ×2 IMPLANT
GUIDEWIRE ORTHOPEDIC L400 MM THREAD OD3.2 MM (Guide Wire) ×1 IMPLANT
GUIDEWIRE ORTHOPEDIC SYNTHES OD2.5 MM (Guide Wire) ×7
GUIDEWIRE ORTHOPEDIC SYNTHES OD3.2 MM (Guide Wire) ×1
PACK SRG LF STRL XTRMT DISP ~~LOC~~ (Pack) ×2
PIN HALF L150 MM OD5 MM APEX HOFFMANN II (Pins) ×1 IMPLANT
PIN HALF L150 MM OD5 MM APEX HOFFMANN II STAINLESS STEEL L50 MM (Pins) ×1 IMPLANT
PIN HLF SS APX HFMN2 5MM 150MM NS ST SLF (Pins) ×2 IMPLANT
PLATE BN SS CRV LCP CMBN 230MM NS 10 HL (Plate) ×2 IMPLANT
PLATE L230 MM CURVE CONDYLAR (Plate) ×1 IMPLANT
PLATE L230 MM CURVE PERIARTICULAR LEFT 10 HOLE 4 COLUMN THREAD (Plate) ×1 IMPLANT
SCREW BN 3.5MM 80MM ST SM HEX SCKT CORT (Screw) ×2 IMPLANT
SCREW BN SS 2.5MM FT 3.5MM 6MM 70MM NS (Screw) ×2 IMPLANT
SCREW BN SS 2.5MM FT 3.5MM 6MM 85MM NS (Screw) ×4 IMPLANT
SCREW BN SS 2MM 3.5MM 40MM NS ST CRFRM (Screw) ×2 IMPLANT
SCREW BN SS 3.5MM 4.5MM 36MM NS ST LG (Screw) ×2 IMPLANT
SCREW BN SS 3.5MM 4.5MM 40MM NS ST LG (Screw) ×6 IMPLANT
SCREW BN SS T25 LCP 5MM 55MM NS ST LCK (Screw) ×2 IMPLANT
SCREW BN SS T25 LCP 5MM 60MM NS ST LCK (Screw) ×2 IMPLANT
SCREW BN SS T25 LCP 5MM 70MM NS ST LCK (Rods) ×2 IMPLANT
SCREW BN SS T25 LCP 5MM 75MM NS ST LCK (Screw) ×4 IMPLANT
SCREW BN SS T25 LCP 5MM 85MM NS ST LCK (Screw) ×2 IMPLANT
SCREW BONE L70 MM OD3.5 MM ODSEC6 MM 2.5 MM FULL THREAD STAINLESS (Screw) ×1 IMPLANT
SCREW BONE L85 MM OD3.5 MM ODSEC6 MM 2.5 MM FULL THREAD STAINLESS (Screw) ×2 IMPLANT
SCREW L36 MM OD4.5 MM 3.5 MM STAINLESS (Screw) ×1 IMPLANT
SCREW L36 MM OD4.5 MM 3.5 MM STAINLESS STEEL CORTEX SELF TAP LARGE (Screw) ×1 IMPLANT
SCREW L40 MM OD2 MM ODSEC3.5 MM (Screw) ×1 IMPLANT
SCREW L40 MM OD2 MM ODSEC3.5 MM STAINLESS STEEL CORTEX SELF TAP (Screw) ×1 IMPLANT
SCREW L40 MM OD4.5 MM 3.5 MM STAINLESS (Screw) ×3 IMPLANT
SCREW L40 MM OD4.5 MM 3.5 MM STAINLESS STEEL CORTEX SELF TAP LARGE (Screw) ×3 IMPLANT
SCREW L55 MM OD5 MM T25 STAINLESS STEEL (Screw) ×1 IMPLANT
SCREW L55 MM OD5 MM T25 STAINLESS STEEL SELF TAP LOCK VARIABLE ANGLE (Screw) ×1 IMPLANT
SCREW L60 MM OD5 MM T25 STAINLESS STEEL (Screw) ×1 IMPLANT
SCREW L60 MM OD5 MM T25 STAINLESS STEEL SELF TAP LOCK VARIABLE ANGLE (Screw) ×1 IMPLANT
SCREW L70 MM OD3.5 MM ODSEC6 MM 2.5 MM (Screw) ×1 IMPLANT
SCREW L70 MM OD5 MM T25 STAINLESS STEEL (Rods) ×1 IMPLANT
SCREW L70 MM OD5 MM T25 STAINLESS STEEL SELF TAP LOCK VARIABLE ANGLE (Rods) ×1 IMPLANT
SCREW L75 MM OD5 MM T25 STAINLESS STEEL (Screw) ×2 IMPLANT
SCREW L75 MM OD5 MM T25 STAINLESS STEEL SELF TAP LOCK VARIABLE ANGLE (Screw) ×2 IMPLANT
SCREW L80 MM OD3.5 MM CORTEX SELF TAP (Screw) ×1 IMPLANT
SCREW L80 MM OD3.5 MM CORTEX SELF TAP SMALL HEXAGONAL SOCKET BONE NA (Screw) ×1 IMPLANT
SCREW L85 MM OD3.5 MM ODSEC6 MM 2.5 MM (Screw) ×2 IMPLANT
SCREW L85 MM OD5 MM T25 STAINLESS STEEL (Screw) ×1 IMPLANT
SCREW L85 MM OD5 MM T25 STAINLESS STEEL SELF TAP LOCK VARIABLE ANGLE (Screw) ×1 IMPLANT
SOLUTION ANSEP 70% ISPRP 4OZ LF RUB BTL (Scrub Supplies) ×2
SOLUTION ANTISEPTIC RUB BOTTLE MEDLINE (Scrub Supplies) ×1 IMPLANT
SPONGE GAUZE L4 IN X W4 IN 12 PLY (Sponge) ×2 IMPLANT
SPONGE GAUZE L4 IN X W4 IN 12 PLY ABSORBABLE CUT EDGE LOFT FISHERBRAND (Dressing) ×1 IMPLANT
SPONGE GZE PLS CTTN CRTY 4X4IN LF STRL (Sponge) ×4
SPONGE LAP CTTN 18X18IN LF STRL 4 PLY (Sponge) ×2
SPONGE LAPAROTOMY L18 IN X W18 IN 4 PLY (Sponge) ×1
SPONGE LAPAROTOMY L18 IN X W18 IN 4 PLY RADIOPAQUE PYRONEMA FREE HIGH (Sponge) ×1 IMPLANT
STAPLER SKIN L4.1 MM X W6.5 MM 35 WIDE (Staplers) ×1
STAPLER SKIN L4.1 MM X W6.5 MM 35 WIDE STAPLE CARTRIDGE APPOSE ULC (Staplers) ×1 IMPLANT
STAPLER SKN SS PLS APS U 4.1X6.5MM LF 35 (Staplers) ×2
TOWEL L27 IN X W17 IN COTTON PREWASH (Other) ×1
TOWEL L27 IN X W17 IN COTTON PREWASH DELINT HIGH ABSORBENT BLUE (Other) ×1 IMPLANT
TOWEL SRG CTTN 27X17IN LF STRL PREWASH (Other) ×2
TRAY SURGICAL EXTREMITY ~~LOC~~ (Pack) ×1 IMPLANT
WIRE FIXATION OD1.6 MM L150 MM L5 MM (Guide Wire) ×1
WIRE FIXATION OD1.6 MM L150 MM L5 MM KIRSCHNER STAINLESS STEEL TROCAR (Guide Wire) ×1 IMPLANT
WIRE FX SS KRSH 1.6MM 150MM NS TROC PNT (Guide Wire) ×2
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
## 2022-06-11 NOTE — Progress Notes (Signed)
Orthopedic Trauma Daily Progress Note    06/11/2022 9:36 AM    Laura Mclaughlin is a 61 y.o. female with left intraarticular distal femur fracture, right tibial plateau fracture and right intraarticular distal radius fracture.     Subjective: Remains in LLE skeletal traction. Pain controlled with PRNs. Patient Denies nausea, vomiting, or fevers.     Physical Exam:  Vitals:    06/11/22 0200   BP: 144/70   Pulse: 84   Resp: 20   Temp:    SpO2: 95%        Intake/Output Summary (Last 24 hours) at 06/11/2022 5409  Last data filed at 06/11/2022 0446  Gross per 24 hour   Intake 2800 ml   Output 645 ml   Net 2155 ml     Lab Results   Component Value Date    INR 1.2 (H) 06/09/2022       right upper extremity:  Splint clean/dry/intact  +EPL, FPL, DI  Sensation intact light touch Rad/Med/Ulnar nerves  Cap refill < 2 seconds  Rad pulse palpable  No pain with passive ROM digits    left lower extremity:  In tibial skeletal traction  Skin and pin sites clean without drainage  Dressings clean/dry/intact  +TA,GS  +EHL,FHL  Sensation intact light touch dorsal/plantar foot  Cap refill < 2 seconds  Pedal pulses palpable  No pain with passive ROM digits    right lower extremity:  Dressings clean/dry/intact. In KI  +TA,GS  +EHL,FHL  Sensation intact light touch dorsal/plantar foot  Cap refill < 2 seconds  Pedal pulses palpable  No pain with passive ROM digits    Lab Results   Component Value Date    HGB 9.0 (L) 06/11/2022    HGB 9.3 (L) 06/10/2022    HGB 10.4 (L) 06/09/2022      Lab Results   Component Value Date    HCT 27.8 (L) 06/11/2022    HCT 28.0 (L) 06/10/2022    HCT 31.1 (L) 06/09/2022    Review of pertinent labs demonstrate:   - Hemodynamic stability with no significant changes.    Assessment: 61 y.o. female with left intraarticular distal femur fracture, right tibial plateau fracture and right intraarticular distal radius fracture.     Plan:   - Mobility: Bedrest while in traction   - Pain control: Continue to wean/titrate to  appropriate oral regimen   - DVT Prophylaxis (per Ortho Trauma service Protocol): - Usual Ortho Trauma protocol would be chemical prophylaxis for 6 weeks, but defer to primary team given confounding factors   - Foley catheter status: Does not have Foley   - Further surgical plans: OR today    - RUE: NWB   - LUE:  WBAT, TTWB   - RLE:  NWB   - LLE:  NWB in traction   - No transfusion indicated, but will continue to follow serial labs and titrate IV fluids & volume expanders as indicated to optimize hemodynamics/volume status.   - Disposition: Pending OR    Tona Sensing, MD

## 2022-06-11 NOTE — Transfer of Care (Signed)
Anesthesia Transfer of Care Note    Patient: Laura Mclaughlin    Procedures performed: Procedure(s):  ORIF, FEMUR, DISTAL    Anesthesia type: General ETT and Regional Anesthesia    Patient location:PACU    Last vitals:   Vitals:    06/11/22 1442   BP:    Pulse:    Resp:    Temp:    SpO2: 98%       Post pain: Patient not complaining of pain, continue current therapy      Mental Status:awake and alert     Respiratory Function: tolerating face mask    Cardiovascular: stable    Nausea/Vomiting: patient not complaining of nausea or vomiting    Hydration Status: adequate    Post assessment: no apparent anesthetic complications    Signed by: Janene Madeira, DO  06/11/22 6:31 PM

## 2022-06-11 NOTE — Progress Notes (Signed)
Report called to receiving RN. A&Ox4, VSS. LLE 18 lb traction in place, unchanged neurovascular exam. NPO for ORIF tomorrow. CT scans to be done at that time per nursing report. Patient and family member bedside notified of upcoming transfer to room 332. Chart, belonging and medications to be transferred with patient.

## 2022-06-11 NOTE — Anesthesia Preprocedure Evaluation (Addendum)
Anesthesia Evaluation    AIRWAY    Mallampati: III    TM distance: >3 FB  Neck ROM: full  Mouth Opening:full  Planned to use difficult airway equipment: Yes CARDIOVASCULAR    cardiovascular exam normal       DENTAL    no notable dental hx               PULMONARY    pulmonary exam normal     OTHER FINDINGS                                      Relevant Problems   GU/RENAL   (+) Liver hematoma, initial encounter               Anesthesia Plan    ASA 3     general                     Detailed anesthesia plan: general endotracheal        Post op pain management: per surgeon and PNB single shot    informed consent obtained    Plan discussed with resident and attending.    ECG reviewed  pertinent labs reviewed             Signed by: Hessie Diener, MD 06/11/22 1:58 PM
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
## 2022-06-11 NOTE — Anesthesia Procedure Notes (Signed)
Intubation - In OR    Start time:  06/11/2022 3:11 PM  Procedure Details  Atraumatic insertion:  Yes  Preoxygenation:  Yes  Induction:  IV  Mask ventilation:  Easy    Attempts  Number of attempts:  1    Successful Attempt  Intubation method:  Glidescope  Cricoid pressure: Yes    Cords visualized: Yes    ETT View:  Full  Blade size:  2  Site:  Oral  Tube type:  Cuffed  Stylet: Yes      Verification  Type:  Regular  Cuff inflated: Yes    ETT to lip (cm):  22  Tube secured with:  Adhesive tape  Breath sounds:  Equal  Placement verification:  Auscultation and Capnometry  Patient tolerance:  Patient tolerated the procedure well with no immediate complications

## 2022-06-11 NOTE — OR PreOp (Signed)
Assisted anesthesiology resident Dr. Andres Labrum with placing left fascia iliaca single shot nerve block intraop. Vital signs monitored and charted by resident. Sterile protocol maintained. Patient was previously sedated and intubated for procedure.  Expected outcomes, (pain control,decreased opioid requirements and side effects, limb safety) discussed with patient and family in preop by anesthesiologist. No complications noted while placing nerve block.

## 2022-06-11 NOTE — Plan of Care (Signed)
Post-Op Check    Subjective:  Laura Mclaughlin is a 61 y.o. female now POD0 s/p ORIF L femur. Pt is feeling well currently. Reports minimal pain, denies n/v, SOB, dizziness, numbness, weakness.      Objective:    Vitals:   Vitals:    06/11/22 2030   BP: 124/67   Pulse: 88   Resp: 21   Temp:    SpO2:      General: NAD, resting comfortably in bed   CV: RRR, nl S1/S2  Pulm: Lungs CTAB. No increased work of breathing. On 1L NC  Abdomen: Soft, nondistended, nontender. No rebound or guarding.   Ext: warm and well-perfused. LLE wrapped, DP pulse/motor/sensory intact.  Neuro: grossly intact, CTO in place    Assessment/Plan:  Laura Mclaughlin is a 60 y.o. female s/p Procedure(s):  ORIF, FEMUR, DISTAL, doing well postoperatively.    - Continue current pain control regimen  - Encouraged IS, frequent ambulation/OOB  - Antiemetics ordered as needed  - DVT ppx: SCDs, lovenox      Karoline Caldwell, MD  General Surgery PGY-1

## 2022-06-11 NOTE — Nursing Progress Note (Signed)
Pt. Transported to Pre-op around 1300. Report given to [employer]
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
## 2022-06-11 NOTE — Anesthesia Procedure Notes (Signed)
Peripheral Nerve Block    Patient location during procedure: OR  Reason for block: Post-op pain management      Injection technique: Single Shot  Block Region: Fascia Iliaca  Laterality: Left      Block at surgeon's request Yes    Start time: 06/11/2022 3:13 PM  End time: 06/11/2022 3:16 PM      Staffing  Anesthesiologist: Hessie Diener, MD  Resident/CRNA: Janene Madeira, DO  Performed: Resident/CRNA       Pre-procedure Checklist     Timeout Completed:  06/11/2022 3:12 PM      Peripheral Block  Patient monitoring: Pulse oximetry, EKG, NIBP and Circuit O2  Patient position: Supine      Needle  Needle type: Tuohy   Needle gauge: 20 G  Needle length: 4 in      Guidance: ultrasound guided  Ultrasound Guided: LA spread visualized, Needle visualized and Relevant anatomy identified (nerve, vessels, muscle)          Assessment   Incremental injection: yes  Injection made incrementally with aspirations every 5 mL.  Injection Resistance: no  Paresthesia Pain: No    Blood Aspirated: No  no suspected intravascular injection  Patient tolerated procedure well: Yes  Block Outcome: No complications
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
## 2022-06-11 NOTE — Brief Op Note (Signed)
Date/Time: 6:37 PM, 06/11/2022    Patient Name:     Laura Mclaughlin     Date of Operation:     06/11/2022     Providers Performing:     Surgeon(s) and Role:     * Hymes, Elana Alm, MD - Primary     * Tona Sensing, MD - Resident - Assisting      Diagnosis:     Left Intraarticular distal femur fracture  Left knee osteoarthritis     Procedure:     Left femur open reduction and internal fixation      Anesthesia:      General  No responsible provider has been recorded for the case.  Anesthesiologist: Hessie Diener, MD  Anesthesia Resident: Janene Madeira, DO        Estimated Blood Loss:     250 mL     Implants:     Implant Name Type Inv. Item Serial No. Manufacturer Lot No. LRB No. Used Action   PLATE BN SS CRV LCP CMBN NS 10 HL - [id number] Plate PLATE BN SS CRV LCP CMBN NS 10 HL  [employer]  Left 1 Implanted   SCREW BN 3.5MM ST SM HEX SCKT CORT - [id number] Screw SCREW BN 3.5MM ST SM HEX SCKT CORT  [employer]  Left 1 Implanted   SCREW BN SS 2.5MM FT 3.5MM  NS - [id number] Screw SCREW BN SS 2.5MM FT 3.5MM  NS  [employer]  Left 2 Implanted   SCREW BN SS 3.5MM NS ST CRFRM - [id number] Screw SCREW BN SS 3.5MM NS ST CRFRM  [employer]  Left 1 Implanted   SCREW BN SS 2.5MM FT 3.5MM  NS - [id number] Screw SCREW BN SS 2.5MM FT 3.5MM  NS  [employer]  Left 1 Implanted   SCREW BN SS 3.5MM 4.5MM NS ST LG - [id number] Screw SCREW BN SS 3.5MM 4.5MM NS ST LG  [employer]  Left 1 Implanted   SCREW BN SS 3.5MM 4.5MM NS ST LG - [id number] Screw SCREW BN SS 3.5MM 4.5MM NS ST LG  [employer]  Left 2 Implanted   SCREW BN SS T25 LCP  NS ST LCK - [id number] Rods SCREW BN SS T25 LCP  NS ST LCK  [employer]  Left 1 Implanted   SCREW BN SS T25 LCP  NS ST LCK - [id number] Screw SCREW BN SS T25 LCP  NS ST LCK  [employer]  Left 2 Implanted   SCREW BN SS T25 LCP  NS ST LCK -  [id number] Screw SCREW BN SS T25 LCP  NS ST LCK  [employer]  Left 1 Implanted   SCREW BN SS T25 LCP  NS ST LCK - [id number] Screw SCREW BN SS T25 LCP  NS ST LCK  [employer]  Left 1 Implanted   SCREW BN SS T25 LCP  NS ST LCK - [id number] Screw SCREW BN SS T25 LCP  NS ST LCK  [employer]  Left 1 Implanted        Drains:     none     Complications:     none     Weightbearing Status:     NWB Left lower extremity     WB Status of other injured extremities: NWB BLE, RUE.   Bed to  chair transfers only    Hip precautions? No    Postop Plan (Wound mgt, Return to OR, etc):     Wound Management:  - Definitively Closed    DVT Prophylaxis:   - Lovenox for 3 months while immobilized in wheelchair (may convert to ASA in several weeks)    Foley catheter status: Continue for strict I/O monitoring    Dressings:  - Soft Dressings (no splint or cast): Dressing change on post-op day 2, then nursing/patient may change dressings as needed with clean gauze and tape or ACE bandages, unless otherwise instructed.      Antibiotics:  - Will continue standard prophylaxis x24 hours    Plan:  - Plan to return to OR for R tibial plateau and R distal radius ORIF

## 2022-06-11 NOTE — Anesthesia Postprocedure Evaluation (Addendum)
Anesthesia Post Evaluation    Patient: Laura Mclaughlin    Procedure(s):  ORIF, FEMUR, DISTAL    Anesthesia type: general    Last Vitals:   Vitals Value Taken Time   BP 127/68 06/11/22 1830   Temp 97.80F 06/11/22 1831   Pulse 87 06/11/22 1830   Resp 21 06/11/22 1830   SpO2 96 % 06/11/22 1830   Vitals shown include unvalidated device data.              Anesthesia Post Evaluation:     Patient Evaluated: bedside  Patient Participation: complete - patient participated  Level of Consciousness: awake and awake and alert  Pain Score: 3  Pain Management: adequate    Airway Patency: patent    Anesthetic complications: No      PONV Status: none    Cardiovascular status: acceptable  Respiratory status: acceptable  Hydration status: acceptable        Signed by: Janene Madeira, DO, 06/11/2022 6:31 PM

## 2022-06-12 ENCOUNTER — Inpatient Hospital Stay: Payer: Medicare Other

## 2022-06-12 ENCOUNTER — Encounter: Payer: Self-pay | Admitting: Orthopaedic Surgery

## 2022-06-12 DIAGNOSIS — S72402A Unspecified fracture of lower end of left femur, initial encounter for closed fracture: Secondary | ICD-10-CM

## 2022-06-12 LAB — ANTI-XA, LMWH: Anti-Xa, LMWH: 0.51 U/mL

## 2022-06-12 MED ORDER — HYDROMORPHONE HCL 0.5 MG/0.5 ML IJ SOLN
0.3000 mg | INTRAMUSCULAR | Status: DC | PRN
Start: 2022-06-12 — End: 2022-06-19
  Administered 2022-06-12 – 2022-06-18 (×21): 0.3 mg via INTRAVENOUS
  Filled 2022-06-12 (×21): qty 1

## 2022-06-12 MED ORDER — LAMOTRIGINE 100 MG PO TABS
300.0000 mg | ORAL_TABLET | Freq: Every day | ORAL | Status: DC
Start: 2022-06-12 — End: 2022-06-29
  Administered 2022-06-12 – 2022-06-29 (×16): 300 mg via ORAL
  Filled 2022-06-12 (×21): qty 3

## 2022-06-12 MED ORDER — STERILE WATER FOR INJECTION IJ/IV SOLN (WRAP)
3.0000 g | Freq: Three times a day (TID) | INTRAMUSCULAR | Status: AC
Start: 2022-06-12 — End: 2022-06-12
  Administered 2022-06-12 (×2): 3 g via INTRAVENOUS
  Filled 2022-06-12 (×3): qty 1000

## 2022-06-12 MED ORDER — IOHEXOL 350 MG/ML IV SOLN
100.0000 mL | Freq: Once | INTRAVENOUS | Status: DC | PRN
Start: 2022-06-12 — End: 2022-06-15

## 2022-06-12 MED ORDER — IOHEXOL 350 MG/ML IV SOLN
150.0000 mL | Freq: Once | INTRAVENOUS | Status: AC | PRN
Start: 2022-06-12 — End: 2022-06-12
  Administered 2022-06-12: 150 mL via INTRAVENOUS

## 2022-06-12 MED ORDER — ENOXAPARIN SODIUM 60 MG/0.6ML IJ SOSY
50.0000 mg | PREFILLED_SYRINGE | Freq: Two times a day (BID) | INTRAMUSCULAR | Status: DC
Start: 2022-06-12 — End: 2022-06-29
  Administered 2022-06-12 – 2022-06-29 (×31): 50 mg via SUBCUTANEOUS
  Filled 2022-06-12 (×36): qty 0.6

## 2022-06-12 MED ORDER — DIAZEPAM 2 MG PO TABS
2.0000 mg | ORAL_TABLET | Freq: Four times a day (QID) | ORAL | Status: DC | PRN
Start: 2022-06-12 — End: 2022-06-12

## 2022-06-12 MED ORDER — CLOBAZAM 10 MG PO TABS
20.0000 mg | ORAL_TABLET | Freq: Every evening | ORAL | Status: DC
Start: 2022-06-12 — End: 2022-06-29
  Administered 2022-06-12 – 2022-06-28 (×17): 20 mg via ORAL
  Filled 2022-06-12 (×18): qty 2

## 2022-06-12 NOTE — Progress Notes (Addendum)
Orthopedic Trauma Daily Progress Note    06/12/2022 5:03 AM    Laura Mclaughlin is a 61 y.o. female with left intraarticular distal femur fracture, right tibial plateau fracture and right intraarticular distal radius fracture. S/p ORIF of L distal femur on 8/7    Subjective: Returned to floor from PACU without complication. NAEON    Patient Denies nausea, vomiting, or fevers.     Physical Exam:  Vitals:    06/11/22 2254   BP: 124/76   Pulse: 90   Resp: 18   Temp: 98.4 F (36.9 C)   SpO2: 95%        Intake/Output Summary (Last 24 hours) at 06/12/2022 0503  Last data filed at 06/12/2022 0400  Gross per 24 hour   Intake 1480 ml   Output 2400 ml   Net -920 ml       Lab Results   Component Value Date    INR 1.2 (H) 06/09/2022       right upper extremity:  Splint clean/dry/intact  +EPL, FPL, DI  Sensation intact light touch Rad/Med/Ulnar nerves  Cap refill < 2 seconds  Rad pulse palpable  No pain with passive ROM digits    left lower extremity:  Dressings c/d/i  +TA,GS  +EHL,FHL  Sensation intact light touch dorsal/plantar foot  Cap refill < 2 seconds  Pedal pulses palpable  No pain with passive ROM digits    right lower extremity:  Dressings clean/dry/intact. In KI  +TA,GS  +EHL,FHL  Sensation intact light touch dorsal/plantar foot  Cap refill < 2 seconds  Pedal pulses palpable  No pain with passive ROM digits    Lab Results   Component Value Date    HGB 9.0 (L) 06/11/2022    HGB 9.3 (L) 06/10/2022    HGB 10.4 (L) 06/09/2022      Lab Results   Component Value Date    HCT 27.8 (L) 06/11/2022    HCT 28.0 (L) 06/10/2022    HCT 31.1 (L) 06/09/2022    Review of pertinent labs demonstrate:   - Hemodynamic stability with no significant changes.    Assessment: 61 y.o. female with left intraarticular distal femur fracture, right tibial plateau fracture and right intraarticular distal radius fracture. S/p ORIF L femur on 8/7    Plan:   - Mobility: Bed to chair transfers only given NWB BLE status. Will hopefully be able to  stand-pivot-transfer on RLE after tibial plateau ORIF   - Pain control: Continue to wean/titrate to appropriate oral regimen   - DVT Prophylaxis (per Ortho Trauma service Protocol): - Usual Ortho Trauma protocol would be chemical prophylaxis for 6 weeks, but defer to primary team given confounding factors   - Foley catheter status: Does not have Foley   - Further surgical plans: Currently has tentative sched time on 8/11 and 8/17 for ORIF of remaining injuries    - RUE: NWB   - LUE:  WBAT   - RLE:  NWB   - LLE:  NWB    - No transfusion indicated, but will continue to follow serial labs and titrate IV fluids & volume expanders as indicated to optimize hemodynamics/volume status.   - Disposition: Pending OR    Tona Sensing, MD      Attending Addendum/Attestation:    Agree w/ above findings and plan.    Chevis Pretty, MD  Orthopaedic Trauma  Pager [phone]  Office [phone]
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
## 2022-06-12 NOTE — Plan of Care (Signed)
Neurosurgery:  Upright x- rays were reviewed by Dr. Randell Patient. Recommend follow up in clinic in 6 weeks with repeat CT of the cervical spine.   Signed by: Laurena Slimmer, PA, DMSc   Neurosurgery  Date/Time: 06/12/22 , 6:44 PM

## 2022-06-12 NOTE — Plan of Care (Signed)
Problem: Moderate/High Fall Risk Score >5  Goal: Patient will remain free of falls  Outcome: Progressing  Flowsheets (Taken 06/12/2022 0800)  High (Greater than 13):   HIGH-Consider use of low bed   HIGH-Initiate use of floor mats as appropriate   HIGH-Pharmacy to initiate evaluation and intervention per protocol   HIGH-Apply yellow "Fall Risk" arm band   HIGH-Utilize chair pad alarm for patient while in the chair   HIGH-Bed alarm on at all times while patient in bed   HIGH-Visual cue at entrance to patient's room     Problem: Compromised Hemodynamic Status  Goal: Vital signs and fluid balance maintained/improved  Outcome: Progressing  Flowsheets (Taken 06/10/2022 2249 by Lodema Hong, RN)  Vital signs and fluid balance are maintained/improved:   Position patient for maximum circulation/cardiac output   Monitor/assess vitals and hemodynamic parameters with position changes   Monitor intake and output. Notify LIP if urine output is less than 30 mL/hour.   Monitor/assess lab values and report abnormal values     Problem: Compromised Tissue integrity  Goal: Damaged tissue is healing and protected  Outcome: Progressing  Flowsheets (Taken 06/10/2022 2249 by Lodema Hong, RN)  Damaged tissue is healing and protected:   Monitor/assess Braden scale every shift   Reposition patient every 2 hours and as needed unless able to reposition self   Relieve pressure to bony prominences for patients at moderate and high risk   Avoid shearing injuries   Keep intact skin clean and dry   Increase activity as tolerated/progressive mobility   Use incontinence wipes for cleaning urine, stool and caustic drainage. Foley care as needed   Use bath wipes, not soap and water, for daily bathing   Monitor external devices/tubes for correct placement to prevent pressure, friction and shearing   Encourage use of lotion/moisturizer on skin   Monitor patient's hygiene practices

## 2022-06-12 NOTE — Progress Notes (Signed)
06/12/22 1420   CM Review   Date of 1st IMM letter 06/12/22
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
## 2022-06-12 NOTE — Plan of Care (Signed)
Problem: Moderate/High Fall Risk Score >5  Goal: Patient will remain free of falls  Outcome: Progressing  Flowsheets (Taken 06/11/2022 2100)  High (Greater than 13):   LOW-Fall Interventions Appropriate for Low Fall Risk   LOW-Anticoagulation education for injury risk   HIGH-Consider use of low bed   HIGH-Utilize chair pad alarm for patient while in the chair   HIGH-Bed alarm on at all times while patient in bed   HIGH-Visual cue at entrance to patient's room   MOD-Place Fall Risk level on whiteboard in room   MOD-Include family in multidisciplinary POC discussions     Problem: Pain interferes with ability to perform ADL  Goal: Pain at adequate level as identified by patient  Outcome: Progressing  Flowsheets (Taken 06/12/2022 0233)  Pain at adequate level as identified by patient:   Assess for risk of opioid induced respiratory depression, including snoring/sleep apnea. Alert healthcare team of risk factors identified.   Assess pain on admission, during daily assessment and/or before any "as needed" intervention(s)   Identify patient comfort function goal   Reassess pain within 30-60 minutes of any procedure/intervention, per Pain Assessment, Intervention, Reassessment (AIR) Cycle   Evaluate if patient comfort function goal is met     Problem: Impaired Mobility  Goal: Mobility/Activity is maintained at optimal level for patient  Outcome: Progressing  Flowsheets (Taken 06/12/2022 0233)  Mobility/activity is maintained at optimal level for patient:   Increase mobility as tolerated/progressive mobility   Encourage independent activity per ability   Maintain proper body alignment   Perform active/passive ROM   Plan activities to conserve energy, plan rest periods   Reposition patient every 2 hours and as needed unless able to reposition self
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
## 2022-06-13 DIAGNOSIS — S36116A Major laceration of liver, initial encounter: Secondary | ICD-10-CM

## 2022-06-13 MED ORDER — METHOCARBAMOL 500 MG PO TABS
500.0000 mg | ORAL_TABLET | Freq: Four times a day (QID) | ORAL | Status: DC
Start: 2022-06-13 — End: 2022-06-18
  Administered 2022-06-13 – 2022-06-17 (×16): 500 mg via ORAL
  Filled 2022-06-13 (×17): qty 1

## 2022-06-13 MED ORDER — DESVENLAFAXINE ER 100 MG PO TB24
100.0000 mg | ORAL_TABLET | Freq: Every day | ORAL | Status: DC
Start: 2022-06-13 — End: 2022-06-13

## 2022-06-13 MED ORDER — DESVENLAFAXINE SUCCINATE ER 50 MG PO TB24
100.0000 mg | ORAL_TABLET | Freq: Every day | ORAL | Status: DC
Start: 2022-06-13 — End: 2022-06-29
  Administered 2022-06-13 – 2022-06-29 (×15): 100 mg via ORAL
  Filled 2022-06-13 (×20): qty 2

## 2022-06-13 NOTE — Progress Notes (Addendum)
Orthopedic Trauma Daily Progress Note    06/13/2022 6:30 AM    Laura Mclaughlin is a 61 y.o. female with left intraarticular distal femur fracture, right tibial plateau fracture and right intraarticular distal radius fracture. S/p ORIF of L distal femur on 8/7    Subjective: NAEON. Pain much improved s/p ORIF of L femur and removal of traction    Patient Denies nausea, vomiting, or fevers.     Physical Exam:  Vitals:    06/13/22 0302   BP: 108/72   Pulse: 99   Resp: 18   Temp: 97.9 F (36.6 C)   SpO2: 95%        Intake/Output Summary (Last 24 hours) at 06/13/2022 0630  Last data filed at 06/13/2022 0400  Gross per 24 hour   Intake 1450 ml   Output 1890 ml   Net -440 ml     Lab Results   Component Value Date    INR 1.2 (H) 06/09/2022       right upper extremity:  Splint clean/dry/intact  +EPL, FPL, DI  Sensation intact light touch Rad/Med/Ulnar nerves  Cap refill < 2 seconds  Rad pulse palpable  No pain with passive ROM digits    left lower extremity:  Dressings c/d/i  +TA,GS  +EHL,FHL  Sensation intact light touch dorsal/plantar foot  Cap refill < 2 seconds  Pedal pulses palpable  No pain with passive ROM digits    right lower extremity:  In KI clean/dry/intact. Knee with mild swelling  +TA,GS  +EHL,FHL  Sensation intact light touch dorsal/plantar foot  Cap refill < 2 seconds  Pedal pulses palpable  No pain with passive ROM digits    Lab Results   Component Value Date    HGB 9.0 (L) 06/11/2022    HGB 9.3 (L) 06/10/2022    HGB 10.4 (L) 06/09/2022      Lab Results   Component Value Date    HCT 27.8 (L) 06/11/2022    HCT 28.0 (L) 06/10/2022    HCT 31.1 (L) 06/09/2022    Review of pertinent labs demonstrate:   - Hemodynamic stability with no significant changes.    Assessment: 61 y.o. female with left intraarticular distal femur fracture, right tibial plateau fracture and right intraarticular distal radius fracture. S/p ORIF L femur on 8/7    Plan:   - Mobility: Bed to chair transfers only given NWB BLE status. Will  hopefully be able to stand-pivot-transfer on RLE after tibial plateau ORIF   - Pain control: Continue to wean/titrate to appropriate oral regimen   - DVT Prophylaxis (per Ortho Trauma service Protocol): - Usual Ortho Trauma protocol would be chemical prophylaxis for 6 weeks, but defer to primary team given confounding factors   - Foley catheter status: Does not have Foley   - Further surgical plans: Currently has tentative Sched time on 8/11 and 8/17 for ORIF of remaining injuries    - RUE: NWB   - LUE:  WBAT   - RLE:  NWB   - LLE:  NWB    - No transfusion indicated, but will continue to follow serial labs and titrate IV fluids & volume expanders as indicated to optimize hemodynamics/volume status.   - Disposition: Pending OR    Tona Sensing, MD    I have personally seen and examined this patient and have participated in their care. I agree with the clinical information, including the physical exam, patient history, and planning as documented above/below. In addition, I  have edited this note to reflect my findings and plan as well as to incorporate any new data.      Laura Namba A. Deundra Furber, MD   Orthopaedic Trauma  [facility]   [address]   [place], [place] [address]   Spectra [phone]
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
## 2022-06-13 NOTE — Plan of Care (Signed)
Problem: Moderate/High Fall Risk Score >5  Goal: Patient will remain free of falls  Outcome: Progressing  Flowsheets (Taken 06/12/2022 2000)  High (Greater than 13):   LOW-Fall Interventions Appropriate for Low Fall Risk   LOW-Anticoagulation education for injury risk   MOD-Request PT/OT consult order for patients with gait/mobility impairment   MOD-Include family in multidisciplinary POC discussions   MOD-Place Fall Risk level on whiteboard in room   HIGH-Visual cue at entrance to patient's room   HIGH-Bed alarm on at all times while patient in bed   HIGH-Consider use of low bed   HIGH-Initiate use of floor mats as appropriate     Problem: Pain interferes with ability to perform ADL  Goal: Pain at adequate level as identified by patient  Outcome: Progressing  Flowsheets (Taken 06/12/2022 0233)  Pain at adequate level as identified by patient:   Assess for risk of opioid induced respiratory depression, including snoring/sleep apnea. Alert healthcare team of risk factors identified.   Assess pain on admission, during daily assessment and/or before any "as needed" intervention(s)   Identify patient comfort function goal   Reassess pain within 30-60 minutes of any procedure/intervention, per Pain Assessment, Intervention, Reassessment (AIR) Cycle   Evaluate if patient comfort function goal is met     Problem: Side Effects from Pain Analgesia  Goal: Patient will experience minimal side effects of analgesic therapy  Outcome: Progressing  Flowsheets (Taken 06/13/2022 0049)  Patient will experience minimal side effects of analgesic therapy:   Monitor/assess patient's respiratory status (RR depth, effort, breath sounds)   Assess for changes in cognitive function   Prevent/manage side effects per LIP orders (i.e. nausea, vomiting, pruritus, constipation, urinary retention, etc.)   Evaluate for opioid-induced sedation with appropriate assessment tool (i.e. POSS)

## 2022-06-13 NOTE — Plan of Care (Signed)
Problem: Moderate/High Fall Risk Score >5  Goal: Patient will remain free of falls  Outcome: Progressing  Flowsheets (Taken 06/13/2022 0800)  High (Greater than 13):   LOW-Fall Interventions Appropriate for Low Fall Risk   LOW-Anticoagulation education for injury risk     Problem: Pain interferes with ability to perform ADL  Goal: Pain at adequate level as identified by patient  Outcome: Progressing  Flowsheets (Taken 06/13/2022 1053)  Pain at adequate level as identified by patient:   Assess for risk of opioid induced respiratory depression, including snoring/sleep apnea. Alert healthcare team of risk factors identified.   Identify patient comfort function goal   Reassess pain within 30-60 minutes of any procedure/intervention, per Pain Assessment, Intervention, Reassessment (AIR) Cycle   Assess pain on admission, during daily assessment and/or before any "as needed" intervention(s)   Evaluate if patient comfort function goal is met   Offer non-pharmacological pain management interventions   Consult/collaborate with Physical Therapy, Occupational Therapy, and/or Speech Therapy   Include patient/patient care companion in decisions related to pain management as needed   Consult/collaborate with Pain Service   Evaluate patient's satisfaction with pain management progress

## 2022-06-13 NOTE — Progress Notes (Addendum)
TACS Nursing Progress Note    Laura Mclaughlin is a 61 y.o. female  Admitted 06/09/2022  5:14 PM [facility] day 4) for Liver hematoma, initial encounter [S36.112A]  Closed nondisplaced fracture of second cervical vertebra, unspecified fracture morphology, initial encounter [S12.101A]  Displaced bicondylar fracture of right tibia, initial encounter for closed fracture [S82.141A]        Major Shift Events:  PRN pain given x4  Q2 turns for comfort   Pt worked with PT/OT able to dangle at bedside  Foley removed DTV at 1830, Pt states that the do not feel the urge        Review of Systems  Neuro:  Q4 Neuro and NV intact  Q2 turns   FC   Immobilizer on RLE, ORIF LLE, Ace wrap to RUE  NWB BLE, RUE but platform is okay    CTO at all times    Cardiac:  Temp:  [97.7 F (36.5 C)-98.8 F (37.1 C)] 98.3 F (36.8 C)  Heart Rate:  [89-108] 90  Resp Rate:  [16-21] 20  BP: (103-116)/(67-72) 107/68      Respiratory:  RA  Lung sounds clear  IS at bedside    GI/GU:  Voids to foley, foley care completed   Bariatric Purree Diet    BM this shift? No    Skin Assessment  Skin Integrity: Abrasion, Bruising, Laceration  Abrasion Skin Location: sctat  Bruising Skin Location: scatt     Braden Scale Score: 15 (06/13/22 0800)       LDAWs  Patient Lines/Drains/Airways Status       Active Lines, Drains and Airways       Name Placement date Placement time Site Days    Peripheral IV 06/10/22 20 G Left Antecubital 06/10/22  0118  Antecubital  3    Peripheral IV 06/10/22 20 G Left;Anterior Forearm 06/10/22  0119  Forearm  3    Peripheral IV 06/12/22 18 G Anterior;Distal;Left Antecubital 06/12/22  0823  Antecubital  1                   Wound 06/09/22 Traumatic Head Right;Upper Laceration R forehead (Active)   Date First Assessed: 06/09/22   Wound Type: Traumatic  Location: Head  Wound Location Orientation: Right;Upper  Wound Description (Comments): Laceration R forehead  Present on Admission: Yes      Assessments 06/10/2022 12:30 AM 06/11/2022  7:40 PM    Site Description Pink;Red Pink;Red   Peri-wound Description Pink Pink   Closure Sutures Sutures;Open to air   Drainage Amount None None   Dressing Open to air Open to air       No associated orders.       Wound 06/10/22 Surgical Incision Abdomen lap sites (Active)   Date First Assessed/Time First Assessed: 06/10/22 0030   Wound Type: Surgical Incision  Location: Abdomen  Wound Description (Comments): lap sites  Present on Admission: Yes      Assessments 06/10/2022 12:30 AM 06/11/2022  8:00 AM   Site Description Dressing covering site (UTA) Dressing covering site (UTA)   Peri-wound Description Dressing covering site (UTA) Dressing covering site (UTA)   Closure Steri-strips;Approximated Approximated, closed and dry   Drainage Amount None None   Dressing Steri-strips Steri-strips   Dressing Status Clean;Dry;Intact Clean;Dry;Intact       No associated orders.       Wound 06/11/22 Surgical Incision Leg Left Xeroform, 4x4's,  (Active)   Date First Assessed/Time First Assessed: 06/11/22 1543   Wound  Type: Surgical Incision  Location: Leg  Wound Location Orientation: Left  Wound Description (Comments): Xeroform, 4x4's,       Assessments 06/11/2022  6:22 PM 06/12/2022  9:52 PM   Site Description Dressing covering site (UTA) Dry;Intact   Peri-wound Description Dressing covering site (UTA) Pink   Closure Dressing covering site (UTA) --   Drainage Amount None --   Dressing Ace Wrap --   Dressing Changed New --   Dressing Status Clean;Dry;Intact Old drainage;Intact       No associated orders.         Psycho/Social:  Calm and cooperative    Interpreter Services:  Does the patient require an Interpreter? No    Disposition for Discharge:TBD

## 2022-06-14 ENCOUNTER — Inpatient Hospital Stay: Payer: Medicare Other

## 2022-06-14 ENCOUNTER — Encounter: Payer: Self-pay | Admitting: Surgery

## 2022-06-14 ENCOUNTER — Inpatient Hospital Stay: Payer: Medicare Other | Admitting: Anesthesiology

## 2022-06-14 ENCOUNTER — Encounter
Admission: AC | Disposition: A | Discharge status: Rehabilitation Facility | Payer: Self-pay | Source: Other Acute Inpatient Hospital | Attending: Surgery

## 2022-06-14 DIAGNOSIS — S83261A Peripheral tear of lateral meniscus, current injury, right knee, initial encounter: Secondary | ICD-10-CM

## 2022-06-14 DIAGNOSIS — S82141A Displaced bicondylar fracture of right tibia, initial encounter for closed fracture: Secondary | ICD-10-CM

## 2022-06-14 DIAGNOSIS — S52571A Other intraarticular fracture of lower end of right radius, initial encounter for closed fracture: Secondary | ICD-10-CM

## 2022-06-14 HISTORY — PX: APPLICATION, EXTERNAL FIXATOR, UPPER EXTREMITY: SHX3069

## 2022-06-14 HISTORY — PX: ORIF, TIBIA PLATEAU: SHX4920

## 2022-06-14 SURGERY — ORIF, TIBIA PLATEAU
Anesthesia: Anesthesia General | Site: Leg Lower | Laterality: Right | Wound class: Clean

## 2022-06-14 MED ORDER — TOBRAMYCIN SULFATE 1.2 G IJ SOLR
INTRAMUSCULAR | Status: AC
Start: 2022-06-14 — End: ?
  Filled 2022-06-14: qty 1200

## 2022-06-14 MED ORDER — METOCLOPRAMIDE HCL 5 MG/ML IJ SOLN
10.0000 mg | Freq: Once | INTRAMUSCULAR | Status: DC | PRN
Start: 2022-06-14 — End: 2022-06-14

## 2022-06-14 MED ORDER — OXYCODONE HCL 5 MG PO TABS
ORAL_TABLET | ORAL | Status: AC
Start: 2022-06-14 — End: 2022-06-14
  Administered 2022-06-14: 10 mg via ORAL
  Filled 2022-06-14: qty 2

## 2022-06-14 MED ORDER — TOBRAMYCIN SULFATE 1.2 G IJ SOLR
INTRAMUSCULAR | Status: DC | PRN
Start: 2022-06-14 — End: 2022-06-14
  Administered 2022-06-14: 1200 mg

## 2022-06-14 MED ORDER — VANCOMYCIN HCL 1 G IV SOLR
INTRAVENOUS | Status: AC
Start: 2022-06-14 — End: ?
  Filled 2022-06-14: qty 1000

## 2022-06-14 MED ORDER — GLYCOPYRROLATE 0.2 MG/ML IJ SOLN (WRAP)
INTRAMUSCULAR | Status: AC
Start: 2022-06-14 — End: ?
  Filled 2022-06-14: qty 2

## 2022-06-14 MED ORDER — HYDROMORPHONE HCL 0.5 MG/0.5 ML IJ SOLN
0.5000 mg | INTRAMUSCULAR | Status: DC | PRN
Start: 2022-06-14 — End: 2022-06-14

## 2022-06-14 MED ORDER — HYDRALAZINE HCL 20 MG/ML IJ SOLN
10.0000 mg | INTRAMUSCULAR | Status: DC
Start: 2022-06-14 — End: 2022-06-14
  Filled 2022-06-14: qty 1

## 2022-06-14 MED ORDER — FENTANYL CITRATE (PF) 50 MCG/ML IJ SOLN (WRAP)
INTRAMUSCULAR | Status: AC
Start: 2022-06-14 — End: 2022-06-14
  Administered 2022-06-14: 25 ug via INTRAVENOUS
  Filled 2022-06-14: qty 2

## 2022-06-14 MED ORDER — LIDOCAINE HCL (PF) 2 % IJ SOLN
INTRAMUSCULAR | Status: AC
Start: 2022-06-14 — End: ?
  Filled 2022-06-14: qty 5

## 2022-06-14 MED ORDER — PROPOFOL 10 MG/ML IV EMUL (WRAP)
INTRAVENOUS | Status: DC | PRN
Start: 2022-06-14 — End: 2022-06-14
  Administered 2022-06-14: 200 mg via INTRAVENOUS

## 2022-06-14 MED ORDER — MEPERIDINE HCL 25 MG/ML IJ SOLN
12.5000 mg | INTRAMUSCULAR | Status: DC | PRN
Start: 2022-06-14 — End: 2022-06-14

## 2022-06-14 MED ORDER — FENTANYL CITRATE (PF) 50 MCG/ML IJ SOLN (WRAP)
INTRAMUSCULAR | Status: AC
Start: 2022-06-14 — End: ?
  Filled 2022-06-14: qty 2

## 2022-06-14 MED ORDER — VANCOMYCIN HCL 1 G IV SOLR
INTRAVENOUS | Status: DC | PRN
Start: 2022-06-14 — End: 2022-06-14
  Administered 2022-06-14: 1000 mg via TOPICAL

## 2022-06-14 MED ORDER — STERILE WATER FOR INJECTION IJ/IV SOLN (WRAP)
3.0000 g | Freq: Three times a day (TID) | INTRAMUSCULAR | Status: AC
Start: 2022-06-14 — End: 2022-06-15
  Administered 2022-06-14 – 2022-06-15 (×2): 3 g via INTRAVENOUS
  Filled 2022-06-14 (×2): qty 1000

## 2022-06-14 MED ORDER — HYDROMORPHONE HCL 1 MG/ML IJ SOLN
INTRAMUSCULAR | Status: DC | PRN
Start: 2022-06-14 — End: 2022-06-14
  Administered 2022-06-14 (×2): .5 mg via INTRAVENOUS

## 2022-06-14 MED ORDER — ROCURONIUM BROMIDE 50 MG/5ML IV SOLN
INTRAVENOUS | Status: AC
Start: 2022-06-14 — End: ?
  Filled 2022-06-14: qty 5

## 2022-06-14 MED ORDER — HYDROCODONE-ACETAMINOPHEN 5-325 MG PO TABS
2.0000 | ORAL_TABLET | Freq: Once | ORAL | Status: DC | PRN
Start: 2022-06-14 — End: 2022-06-14

## 2022-06-14 MED ORDER — ACETAMINOPHEN 500 MG PO TABS
ORAL_TABLET | ORAL | Status: AC
Start: 2022-06-14 — End: 2022-06-14
  Administered 2022-06-14: 1000 mg via ORAL
  Filled 2022-06-14: qty 2

## 2022-06-14 MED ORDER — FENTANYL CITRATE (PF) 50 MCG/ML IJ SOLN (WRAP)
25.0000 ug | INTRAMUSCULAR | Status: DC | PRN
Start: 2022-06-14 — End: 2022-06-14
  Administered 2022-06-14: 25 ug via INTRAVENOUS

## 2022-06-14 MED ORDER — LIDOCAINE HCL (PF) 1 % IJ SOLN
INTRAMUSCULAR | Status: AC
Start: 2022-06-14 — End: ?
  Filled 2022-06-14: qty 5

## 2022-06-14 MED ORDER — ONDANSETRON HCL 4 MG/2ML IJ SOLN
INTRAMUSCULAR | Status: AC
Start: 2022-06-14 — End: ?
  Filled 2022-06-14: qty 2

## 2022-06-14 MED ORDER — ROPIVACAINE HCL 5 MG/ML IJ SOLN
INTRAMUSCULAR | Status: AC
Start: 2022-06-14 — End: ?
  Filled 2022-06-14: qty 30

## 2022-06-14 MED ORDER — FENTANYL CITRATE (PF) 50 MCG/ML IJ SOLN (WRAP)
INTRAMUSCULAR | Status: DC | PRN
Start: 2022-06-14 — End: 2022-06-14
  Administered 2022-06-14 (×3): 50 ug via INTRAVENOUS

## 2022-06-14 MED ORDER — ONDANSETRON HCL 4 MG/2ML IJ SOLN
4.0000 mg | Freq: Once | INTRAMUSCULAR | Status: DC | PRN
Start: 2022-06-14 — End: 2022-06-14

## 2022-06-14 MED ORDER — LABETALOL HCL 5 MG/ML IV SOLN (WRAP)
10.0000 mg | INTRAVENOUS | Status: DC | PRN
Start: 2022-06-14 — End: 2022-06-14

## 2022-06-14 MED ORDER — LACTATED RINGERS IV SOLN
INTRAVENOUS | Status: DC | PRN
Start: 2022-06-14 — End: 2022-06-14

## 2022-06-14 MED ORDER — ROCURONIUM BROMIDE 50 MG/5ML IV SOLN
INTRAVENOUS | Status: DC | PRN
Start: 2022-06-14 — End: 2022-06-14
  Administered 2022-06-14: 50 mg via INTRAVENOUS
  Administered 2022-06-14: 20 mg via INTRAVENOUS

## 2022-06-14 MED ORDER — PROPOFOL 10 MG/ML IV EMUL (WRAP)
INTRAVENOUS | Status: AC
Start: 2022-06-14 — End: ?
  Filled 2022-06-14: qty 50

## 2022-06-14 MED ORDER — MIDAZOLAM HCL 1 MG/ML IJ SOLN (WRAP)
INTRAMUSCULAR | Status: AC
Start: 2022-06-14 — End: ?
  Filled 2022-06-14: qty 2

## 2022-06-14 MED ORDER — HYDROMORPHONE HCL 1 MG/ML IJ SOLN
INTRAMUSCULAR | Status: AC
Start: 2022-06-14 — End: ?
  Filled 2022-06-14: qty 1

## 2022-06-14 MED ORDER — PROPOFOL INFUSION 10 MG/ML
INTRAVENOUS | Status: DC | PRN
Start: 2022-06-14 — End: 2022-06-14
  Administered 2022-06-14: 30 ug/kg/min via INTRAVENOUS

## 2022-06-14 MED ORDER — PROPOFOL 10 MG/ML IV EMUL (WRAP)
INTRAVENOUS | Status: AC
Start: 2022-06-14 — End: ?
  Filled 2022-06-14: qty 20

## 2022-06-14 MED ORDER — ONDANSETRON HCL 4 MG/2ML IJ SOLN
INTRAMUSCULAR | Status: DC | PRN
Start: 2022-06-14 — End: 2022-06-14
  Administered 2022-06-14: 4 mg via INTRAVENOUS

## 2022-06-14 MED ORDER — CEFAZOLIN SODIUM 2 G IJ SOLR
INTRAMUSCULAR | Status: AC
Start: 2022-06-14 — End: ?
  Filled 2022-06-14: qty 2000

## 2022-06-14 MED ORDER — CEFAZOLIN SODIUM 1 G IJ SOLR
INTRAMUSCULAR | Status: AC
Start: 2022-06-14 — End: ?
  Filled 2022-06-14: qty 2000

## 2022-06-14 MED ORDER — CEFAZOLIN SODIUM 1 G IJ SOLR
INTRAMUSCULAR | Status: DC | PRN
Start: 2022-06-14 — End: 2022-06-14
  Administered 2022-06-14: 3 g via INTRAVENOUS

## 2022-06-14 MED ORDER — ACETAMINOPHEN 500 MG PO TABS
1000.0000 mg | ORAL_TABLET | Freq: Once | ORAL | Status: AC | PRN
Start: 2022-06-14 — End: 2022-06-14

## 2022-06-14 MED ORDER — SODIUM CHLORIDE 0.9 % IR SOLN
Status: DC | PRN
Start: 2022-06-14 — End: 2022-06-14
  Administered 2022-06-14: 1000 mL

## 2022-06-14 MED ORDER — NEOSTIGMINE METHYLSULFATE 1 MG/ML IJ/IV SOLN (WRAP)
INTRAMUSCULAR | Status: DC | PRN
Start: 2022-06-14 — End: 2022-06-14
  Administered 2022-06-14: 5 mL via INTRAVENOUS

## 2022-06-14 MED ORDER — STERILE WATER FOR INJECTION IJ/IV SOLN (WRAP)
2.0000 g | INTRAMUSCULAR | Status: DC
Start: 2022-06-14 — End: 2022-06-14

## 2022-06-14 MED ORDER — NEOSTIGMINE METHYLSULFATE 1 MG/ML IJ/IV SOLN (WRAP)
Status: AC
Start: 2022-06-14 — End: ?
  Filled 2022-06-14: qty 5

## 2022-06-14 SURGICAL SUPPLY — 98 items
APPLICATOR CHLORAPREP 26 ML 70% ISOPROPYL ALCOHOL 2% CHLORHEXIDINE (Applicator) ×4 IMPLANT
APPLICATOR PRP 70% ISPRP 2% CHG 26ML (Applicator) ×6
BAG EQP 28X36IN BAND (Other) ×3
BAG EQUIPMENT L28 IN X W36 IN BAND (Other) ×2 IMPLANT
BANDAGE CMPR PLSTR CTTN MED MTRX 5YDX6IN (Bandage) ×3
BANDAGE CMPR PLSTR CTTN PRCR 5YDX6IN LF (Procedure Accessories)
BANDAGE ELASTIC L5 YD X W6 IN W/SELF-CLOSURE HOOK (Bandage) ×2 IMPLANT
BANDAGE GAUZE L3.6 YD X W3.4 IN 6 PLY ABSORBENT STRETCH TIGHT FINISH (Bandage) ×2 IMPLANT
BANDAGE GZE CTTN BLK2 3.6YDX3.4IN LF (Bandage) ×3
BANDAGE MEDLINE GAUZE L3.6 YD X W3.4 IN (Bandage) ×2
BANDAGE MEDLINE MEDIUM COMPRESSION L5 YD (Bandage) ×2
BANDAGE PROCARE COMPRESSION L5 YD X W6 (Procedure Accessories)
BANDAGE PROCARE COMPRESSION L5 YD X W6 IN 2 CLIP FASTENER BREATHABLE (Procedure Accessories) IMPLANT
CLAMP EXTERNAL FIXATION HOFFMANN II (External Fixator) ×4
CLAMP EXTERNAL FIXATION HOFFMANN II COMPACT PERIARTICULAR STRAIGHT 4 (External Fixator) ×4 IMPLANT
CLAMP XTRNFX STRG HFMN2 CMPCT 4 HL (External Fixator) ×6
COUPLING EXTERNAL FIXATION PIN TO ROD (External Fixator) ×8
COUPLING EXTERNAL FIXATION PIN TO ROD MRI HOFFMANN 3 (External Fixator) ×8 IMPLANT
COUPLING XTRNFX HFMN 3 PIN TO ROD MRI (External Fixator) ×12
COVER FLEXIBLE LIGHT HANDLE PLASTIC GREEN (Procedure Accessories) ×4 IMPLANT
COVER FLEXIBLE MEDLINE LIGHT HANDLE (Procedure Accessories) ×4
COVER LGHT HNDL PLS LF STRL FLXB DISP (Procedure Accessories) ×6
DRAPE EQP C-ARMOR STRL XPD CLPSBL CARM (Drape) ×3
DRAPE EXPAND COLLAPSIBLE EQUIPMENT C-ARMOR C ARM FLUOROSCOPE STERILE (Drape) ×2 IMPLANT
DRAPE SRG PLS U STRDRP 51X47IN LF STRL (Drape) ×3
DRAPE SRG TBRN XL CNVRT 98X77IN LF STRL (Drape) ×6
DRAPE SURGICAL ADHESIVE L51 IN X W47 IN (Drape) ×2
DRAPE SURGICAL ADHESIVE L51 IN X W47 IN STERI-DRAPE CLEAR (Drape) ×2 IMPLANT
DRAPE SURGICAL SHEET FANFOLD L98 IN X (Drape) ×4
DRAPE SURGICAL SHEET FANFOLD L98 IN X W77 IN TIBURON XL PINK (Drape) ×4 IMPLANT
DRESSING PETRO 3% BI 3BRM GZE XR 9X5IN (Dressing) ×3
DRESSING PETROLATUM L9 IN X W5 IN NONADHESIVE OCCLUSIVE BACTERIOSTATIC (Dressing) ×2 IMPLANT
ELECTRODE ADULT PATIENT RETURN L9 FT REM POLYHESIVE ACRYLIC FOAM (Procedure Accessories) ×2 IMPLANT
ELECTRODE PATIENT RETURN L9 FT VALLEYLAB (Procedure Accessories) ×2
ELECTRODE PT RTN RM PHSV ACRL FM C30- LB (Procedure Accessories) ×3
GAUZE PADS 4 X 4 (Dressing) ×3
GLOVE SRG NTR RBR 8 INDCTR BGL 299X103MM (Glove) ×3
GLOVE SRG PLISPRN 8 BGL PI MIC LF STRL (Glove) ×6
GLOVE SURGICAL 8 BIOGEL PI MICRO POWDER (Glove) ×4
GLOVE SURGICAL 8 BIOGEL PI MICRO POWDER FREE BEAD CUFF MICRO ROUGHENED (Glove) ×4 IMPLANT
GLOVE SURGICAL 8 INDICATOR BIOGEL POWDER (Glove) ×2
GLOVE SURGICAL 8 INDICATOR BIOGEL POWDER FREE SMOOTH BEAD CUFF (Glove) ×2 IMPLANT
GOWN SRG 2XL SMARTGOWN LF STRL LVL 4 (Gown) ×3
GOWN SURGICAL 2XL SMARTGOWN LEVEL 4 (Gown) ×2
GOWN SURGICAL 2XL SMARTGOWN LEVEL 4 BREATHABLE (Gown) ×2 IMPLANT
GRAFT BN CANC 40ML 1-8MM RDGRF PRESERVON (Bone) ×3 IMPLANT
GRAFT BONE CANCELLOUS 40 ML PRESERVON (Bone) ×2 IMPLANT
GRAFT BONE CANCELLOUS 40 ML PRESERVON CHIP CRUSHED READIGRAFT 1-8 MM (Bone) ×2 IMPLANT
PACK SRG LF STRL XTRMT DISP ~~LOC~~ (Pack) ×3
PAD ABD DERMACEA 9X5IN LF STRL 4 SL EDG (Dressing) ×3
PAD ABD PVC CRTY 9X5IN LF STRL 3 LYR (Dressing) IMPLANT
PAD ABDOMINAL L9 IN X W5 IN 4 SEAL EDGE (Dressing) ×2 IMPLANT
PADDING CAST L4 YD X W2 IN UNDERCAST (Procedure Accessories) ×2
PADDING CAST L4 YD X W2 IN UNDERCAST HAND TEARABLE SPECIALIST 100 (Procedure Accessories) ×2 IMPLANT
PADDING CAST L4 YD X W4 IN UNDERCAST (Cast) ×2
PADDING CAST L4 YD X W4 IN UNDERCAST MILD STRETCH COHESIVE REGULAR (Cast) ×2 IMPLANT
PADDING CST CTTN SPCLST 100 4YDX2IN LF (Procedure Accessories) ×3
PADDING CST CTTN WBRL 4YDX4IN LF STRL (Cast) ×3
PIN HALF L80 MM OD3 MM APEX STAINLESS (Pins) ×8 IMPLANT
PIN HALF L80 MM OD3 MM APEX STAINLESS STEEL L20 MM ORTHOPEDIC SELF (Pins) ×8 IMPLANT
PIN HLF SS APX 3MM 80MM NS SLFDRL ST (Pins) ×12 IMPLANT
POUCH 11X7IN 2 ADHSV STRIP 2 COMPARTMENT STERI-DRAPE INSTRUMENT PLSTC (Drape) ×2 IMPLANT
POUCH INST PLS STRDRP 11X7IN STRL 2 ADH (Drape) ×3
POUCH L11 IN X W7 IN 2 ADHESIVE STRIP 2 (Drape) ×2
REF# 4941-2-140 POst, Compact, Angled, 30 degree, Stryker Hoffman ×12 IMPLANT
ROD EXTERNAL FIXATION L250 MM CONNECT (External Fixator) ×4
ROD EXTERNAL FIXATION L250 MM CONNECT OD5 MM HOFFMANN (External Fixator) ×4 IMPLANT
ROD XTRNFX C HFMN C MRI 5MM 250MM CNCT (External Fixator) ×6
SCREW BN SS 2.5MM FT 3.5MM 28MM NS ST (Screw) ×3 IMPLANT
SCREW BN SS 2.5MM FT 3.5MM 34MM NS ST (Screw) ×3 IMPLANT
SCREW BN SS 2.5MM FT 3.5MM 36MM NS ST (Screw) ×6 IMPLANT
SCREW BN SS 2.5MM FT 3.5MM 44MM NS ST (Screw) ×3 IMPLANT
SCREW BONE L28 MM OD3.5 MM 2.5 MM FULL THREAD STAINLESS STEEL CORTEX (Screw) ×2 IMPLANT
SCREW BONE L34 MM OD3.5 MM 2.5 MM FULL THREAD STAINLESS STEEL CORTEX (Screw) ×2 IMPLANT
SCREW BONE L36 MM OD3.5 MM 2.5 MM FULL THREAD STAINLESS STEEL CORTEX (Screw) ×4 IMPLANT
SCREW BONE L44 MM OD3.5 MM 2.5 MM FULL THREAD STAINLESS STEEL CORTEX (Screw) ×2 IMPLANT
SCREW L28 MM OD3.5 MM 2.5 MM FULL THREAD (Screw) ×2 IMPLANT
SCREW L34 MM OD3.5 MM 2.5 MM FULL THREAD (Screw) ×2 IMPLANT
SCREW L36 MM OD3.5 MM 2.5 MM FULL THREAD (Screw) ×4 IMPLANT
SCREW L44 MM OD3.5 MM 2.5 MM FULL THREAD (Screw) ×2 IMPLANT
SOLUTION ANSEP 70% ISPRP 4OZ LF RUB BTL (Scrub Supplies) ×3
SOLUTION ANTISEPTIC RUB BOTTLE MEDLINE (Scrub Supplies) ×2 IMPLANT
SPONGE GAUZE L4 IN X W4 IN 12 PLY (Sponge) ×4 IMPLANT
SPONGE GAUZE L4 IN X W4 IN 12 PLY ABSORBABLE CUT EDGE LOFT FISHERBRAND (Dressing) ×2 IMPLANT
SPONGE GZE PLS CTTN CRTY 4X4IN LF STRL (Sponge) ×6
SPONGE LAP CTTN 18X18IN LF STRL 4 PLY (Sponge) ×3
SPONGE LAPAROTOMY L18 IN X W18 IN 4 PLY (Sponge) ×2
SPONGE LAPAROTOMY L18 IN X W18 IN 4 PLY RADIOPAQUE PYRONEMA FREE HIGH (Sponge) ×2 IMPLANT
STAPLER SKIN L4.1 MM X W6.5 MM 35 WIDE (Staplers) ×2
STAPLER SKIN L4.1 MM X W6.5 MM 35 WIDE STAPLE CARTRIDGE APPOSE ULC (Staplers) ×2 IMPLANT
STAPLER SKN SS PLS APS U 4.1X6.5MM LF 35 (Staplers) ×3
TIP SCT VNYL LG ARG YNKR SLC-TRL 24FR LF (Suction) ×3
TIP SUCTION SELEC-TROL LARGE OD24 FR (Suction) ×2
TIP SUCTION SELEC-TROL LARGE OD24 FR VINYL (Suction) ×2 IMPLANT
TOWEL L27 IN X W17 IN COTTON PREWASH (Other) ×2
TOWEL L27 IN X W17 IN COTTON PREWASH DELINT HIGH ABSORBENT BLUE (Other) ×2 IMPLANT
TOWEL SRG CTTN 27X17IN LF STRL PREWASH (Other) ×3
TRAY SURGICAL EXTREMITY ~~LOC~~ (Pack) ×2 IMPLANT

## 2022-06-14 NOTE — Plan of Care (Signed)
Problem: Moderate/High Fall Risk Score >5  Goal: Patient will remain free of falls  Outcome: Progressing  Flowsheets (Taken 06/14/2022 2000)  High (Greater than 13):   HIGH-Consider use of low bed   HIGH-Pharmacy to initiate evaluation and intervention per protocol   HIGH-Apply yellow "Fall Risk" arm band   HIGH-Bed alarm on at all times while patient in bed   HIGH-Visual cue at entrance to patient's room   HIGH-Utilize chair pad alarm for patient while in the chair   HIGH-Initiate use of floor mats as appropriate   MOD-Place Fall Risk level on whiteboard in room   MOD-Include family in multidisciplinary POC discussions   MOD-Perform dangle, stand, walk (DSW) prior to mobilization   MOD-Utilize diversion activities   MOD-Re-orient confused patients     Problem: Pain interferes with ability to perform ADL  Goal: Pain at adequate level as identified by patient  Outcome: Progressing  Flowsheets (Taken 06/13/2022 1053 by Servando Snare, RN)  Pain at adequate level as identified by patient:   Assess for risk of opioid induced respiratory depression, including snoring/sleep apnea. Alert healthcare team of risk factors identified.   Identify patient comfort function goal   Reassess pain within 30-60 minutes of any procedure/intervention, per Pain Assessment, Intervention, Reassessment (AIR) Cycle   Assess pain on admission, during daily assessment and/or before any "as needed" intervention(s)   Evaluate if patient comfort function goal is met   Offer non-pharmacological pain management interventions   Consult/collaborate with Physical Therapy, Occupational Therapy, and/or Speech Therapy   Include patient/patient care companion in decisions related to pain management as needed   Consult/collaborate with Pain Service   Evaluate patient's satisfaction with pain management progress     Problem: Side Effects from Pain Analgesia  Goal: Patient will experience minimal side effects of analgesic therapy  Flowsheets (Taken 06/14/2022  1508 by Servando Snare, RN)  Patient will experience minimal side effects of analgesic therapy:   Monitor/assess patient's respiratory status (RR depth, effort, breath sounds)   Prevent/manage side effects per LIP orders (i.e. nausea, vomiting, pruritus, constipation, urinary retention, etc.)   Evaluate for opioid-induced sedation with appropriate assessment tool (i.e. POSS)   Assess for changes in cognitive function

## 2022-06-14 NOTE — Progress Notes (Addendum)
Orthopedic Trauma Daily Progress Note    06/14/2022 4:30 AM    Laura Mclaughlin is a 61 y.o. female with left intraarticular distal femur fracture, right tibial plateau fracture and right intraarticular distal radius fracture. S/p ORIF of L distal femur on 8/7    Subjective: NAEON. Pain controlled  Patient Denies nausea, vomiting, or fevers.     Physical Exam:  Vitals:    06/13/22 2241   BP: 128/77   Pulse: 87   Resp: 20   Temp: 98.2 F (36.8 C)   SpO2: 93%        Intake/Output Summary (Last 24 hours) at 06/14/2022 0430  Last data filed at 06/14/2022 0030  Gross per 24 hour   Intake 600 ml   Output 1075 ml   Net -475 ml       Lab Results   Component Value Date    INR 1.2 (H) 06/09/2022       right upper extremity:  Splint clean/dry/intact  +EPL, FPL, DI  Sensation intact light touch Rad/Med/Ulnar nerves  Cap refill < 2 seconds  Rad pulse palpable  No pain with passive ROM digits    left lower extremity:  Dressings c/d/i  +TA,GS  +EHL,FHL  Sensation intact light touch dorsal/plantar foot  Cap refill < 2 seconds  Pedal pulses palpable  No pain with passive ROM digits    right lower extremity:  In KI clean/dry/intact. Knee with mild swelling  +TA,GS  +EHL,FHL  Sensation intact light touch dorsal/plantar foot  Cap refill < 2 seconds  Pedal pulses palpable  No pain with passive ROM digits    Lab Results   Component Value Date    HGB 9.0 (L) 06/11/2022    HGB 9.3 (L) 06/10/2022    HGB 10.4 (L) 06/09/2022      Lab Results   Component Value Date    HCT 27.8 (L) 06/11/2022    HCT 28.0 (L) 06/10/2022    HCT 31.1 (L) 06/09/2022    Review of pertinent labs demonstrate:   - Hemodynamic stability with no significant changes.    Assessment: 61 y.o. female with left intraarticular distal femur fracture, right tibial plateau fracture and right intraarticular distal radius fracture. S/p ORIF L femur on 8/7    Plan:   - Mobility: Bed to chair transfers only given NWB BLE status. Will hopefully be able to stand-pivot-transfer on RLE  after tibial plateau ORIF   - Pain control: Continue to wean/titrate to appropriate oral regimen   - DVT Prophylaxis (per Ortho Trauma service Protocol): - Usual Ortho Trauma protocol would be chemical prophylaxis for 6 weeks, but defer to primary team given confounding factors   - Foley catheter status: Does not have Foley   - Further surgical plans: \Has been NPO since midnight except for sips with meds. OR time opened up for OT TODAY 8/10    - RUE: NWB   - LUE:  WBAT   - RLE:  NWB   - LLE:  NWB    - No transfusion indicated, but will continue to follow serial labs and titrate IV fluids & volume expanders as indicated to optimize hemodynamics/volume status.   - Disposition: Pending OR    Tona Sensing, MD    Attending Addendum/Attestation:     I have personally seen and examined this patient and have participated in their care. I agree with the clinical information, including the physical exam and patient history as documented by the Resident/Advanced Practice Provider.  The above note has been edited to reflect my findings as well as incorporate supplementary history and exam that I have obtained.  Using all of the relevant patient information, I have performed and documented the Medical Decision Making in its entirety.    Plan for external fixation right distal radius and ORIF tibial plateau  NPO for OR  D/w patient and daughter at bedside    Chevis PrettyMichael Akashdeep Chuba, MD  Orthopaedic Trauma  Pager [phone]  Office [phone]

## 2022-06-14 NOTE — Brief Op Note (Signed)
Date/Time: 11:43 AM, 06/14/2022    Patient Name:     Laura Mclaughlin     Date of Operation:     06/14/2022     Providers Performing:     Surgeon(s) and Role:     * Timoteo Ace, MD - Primary     * Jakya Dovidio, Laurence Spates, MD - Resident - Assisting      Diagnosis:     * No post-op diagnosis entered *    Procedure:     Closed reduction right distal radius, right wrist spanning ex-fix, ORIF right tibial plateau      Anesthesia:      General  Hessie Diener, MD  Anesthesiologist: Hessie Diener, MD  CRNA: Betsy Pries, CRNA        Estimated Blood Loss:     * No values recorded between 06/14/2022  8:04 AM and 06/14/2022 11:43 AM *     Implants:     Implant Name Type Inv. Item Serial No. Manufacturer Lot No. LRB No. Used Action   PIN HLF SS APX  NS SLFDRL ST - [id number] Pins PIN HLF SS APX  NS SLFDRL ST  STRYKER [employer]UMA  Right 4 Implanted   GRAFT BN CANC 1-8MM RDGRF PRESERVON - [id number] Bone GRAFT BN CANC 1-8MM RDGRF PRESERVON [id number] LIF[employer]ET [id number] Right 1 Implanted   SCREW BN SS 2.5MM FT 3.5MM NS ST - [id number] Screw SCREW BN SS 2.5MM FT 3.5MM NS ST  [employer]  Right 1 Implanted   SCREW BN SS 2.5MM FT 3.5MM NS ST - [id number] Screw SCREW BN SS 2.5MM FT 3.5MM NS ST  [employer]  Right 1 Implanted   SCREW BN SS 2.5MM FT 3.5MM NS ST - [id number] Screw SCREW BN SS 2.5MM FT 3.5MM NS ST  [employer]UMA  Right 1 Implanted        Drains:     none     Complications:     none     Weightbearing Status:     No Weight  RLE, RUE, LLE    WB Status of other injured extremities: N/A    Hip precautions? No    Postop Plan (Wound mgt, Return to OR, etc):     Wound Management:  - Definitively Closed    DVT Prophylaxis:   - DVT prophylaxis of lovenox     Foley catheter status: Foley for comfort measures    Dressings:  - Soft Dressings (no splint or cast): Dressing change on post-op day 2, then nursing/patient may change dressings as needed with  clean gauze and tape or ACE bandages, unless otherwise instructed.    - Ex-Fix: The patient may take a shower at home and let the warm water run down the extremity. Do not scrub the pin sites or stick anything in the pin sites to clean them. May cover pin sites with xeroform, 4x4's, ACE bandages.    Antibiotics:  - Will continue standard prophylaxis x24 hours    Plan:  - Plan to return to OR in delayed fashion for ORIF Right distal radius
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
## 2022-06-14 NOTE — Progress Notes (Signed)
Assisted anesthesiologist Dr. Patricia Pesa with placing right femoral and right popliteal sciatic peripheral nerve blocks, single shots, intraop. Vital signs monitored and charted by CRNA. Sterile protocol maintained. Patient was previously sedated and intubated for procedure.  Expected outcomes, (pain control,decreased opioid requirements and side effects, limb safety) discussed with patient and family in preop by anesthesiologist. No complications noted while placing nerve block.

## 2022-06-14 NOTE — Plan of Care (Signed)
Post-Op Check    Subjective:  Laura Mclaughlin is a 61 y.o. female now POD0 s/p R wrist ex fix, ORIF R tibia. Pt is feeling well currently. Reports minimal pain, denies numbness, tingling, weakness, SOB, dizziness, n/v.       Objective:    Vitals:   Vitals:    06/14/22 2000   BP:    Pulse: 82   Resp: 19   Temp:    SpO2:      General: NAD, resting comfortably in bed   CV: RRR, nl S1/S2  Pulm: Lungs CTAB. No increased work of breathing. On 2L NC  Abdomen: Soft, nondistended, nontender. No rebound or guarding.   Ext: warm and well-perfused. RUE ex fix, RLE wrapped, both with motor/sensory/pulses intact  Neuro: grossly intact     Assessment/Plan:  Laura Mclaughlin is a 61 y.o. female s/p Procedure(s):  ORIF, TIBIA PLATEAU RIGHT  APPLICATION, EXTERNAL FIXATOR, UPPER EXTREMITY, doing well postoperatively.    - Continue current pain control regimen  - Encouraged IS, frequent ambulation/OOB  - Antiemetics ordered as needed  - DVT ppx: SCDs, lovenox      Karoline Caldwell, MD  General Surgery PGY-1

## 2022-06-14 NOTE — Transfer of Care (Signed)
Anesthesia Transfer of Care Note    Patient: Keliyah Spillman    Procedures performed: Procedure(s):  ORIF, TIBIA PLATEAU RIGHT  APPLICATION, EXTERNAL FIXATOR, UPPER EXTREMITY    Anesthesia type: General ETT    Patient location:PACU    Last vitals:   Vitals:    06/14/22 1152   BP: 142/72   Pulse: 85   Resp: 17   Temp: 36.1 C (97 F)   SpO2: 98%       Post pain: Patient not complaining of pain, continue current therapy      Mental Status:sedated    Respiratory Function: tolerating face mask    Cardiovascular: stable    Nausea/Vomiting: patient not complaining of nausea or vomiting    Hydration Status: adequate    Post assessment: no apparent anesthetic complications, no reportable events and no evidence of recall    Signed by: Betsy Pries, CRNA  06/14/22 11:52 AM

## 2022-06-14 NOTE — Plan of Care (Signed)
Problem: Moderate/High Fall Risk Score >5  Goal: Patient will remain free of falls  Outcome: Progressing  Flowsheets (Taken 06/14/2022 1400)  High (Greater than 13):   HIGH-Consider use of low bed   HIGH-Initiate use of floor mats as appropriate   HIGH-Pharmacy to initiate evaluation and intervention per protocol   HIGH-Apply yellow "Fall Risk" arm band   HIGH-Utilize chair pad alarm for patient while in the chair   HIGH-Bed alarm on at all times while patient in bed   HIGH-Visual cue at entrance to patient's room   MOD-Place Fall Risk level on whiteboard in room   MOD-Include family in multidisciplinary POC discussions   MOD-Request PT/OT consult order for patients with gait/mobility impairment   MOD-Perform dangle, stand, walk (DSW) prior to mobilization   MOD-Utilize diversion activities   MOD-Re-orient confused patients   MOD-Use of assistive devices -Bedside Commode if appropriate   MOD-Remain with patient during toileting   MOD-Consider a move closer to Nurses Station   MOD-Use of chair-pad alarm when appropriate   LOW-Anticoagulation education for injury risk   LOW-Fall Interventions Appropriate for Low Fall Risk     Problem: Pain interferes with ability to perform ADL  Goal: Pain at adequate level as identified by patient  Outcome: Progressing  Flowsheets (Taken 06/13/2022 1053)  Pain at adequate level as identified by patient:   Assess for risk of opioid induced respiratory depression, including snoring/sleep apnea. Alert healthcare team of risk factors identified.   Identify patient comfort function goal   Reassess pain within 30-60 minutes of any procedure/intervention, per Pain Assessment, Intervention, Reassessment (AIR) Cycle   Assess pain on admission, during daily assessment and/or before any "as needed" intervention(s)   Evaluate if patient comfort function goal is met   Offer non-pharmacological pain management interventions   Consult/collaborate with Physical Therapy, Occupational Therapy, and/or  Speech Therapy   Include patient/patient care companion in decisions related to pain management as needed   Consult/collaborate with Pain Service   Evaluate patient's satisfaction with pain management progress     Problem: Side Effects from Pain Analgesia  Goal: Patient will experience minimal side effects of analgesic therapy  Outcome: Progressing  Flowsheets (Taken 06/14/2022 1508)  Patient will experience minimal side effects of analgesic therapy:   Monitor/assess patient's respiratory status (RR depth, effort, breath sounds)   Prevent/manage side effects per LIP orders (i.e. nausea, vomiting, pruritus, constipation, urinary retention, etc.)   Evaluate for opioid-induced sedation with appropriate assessment tool (i.e. POSS)   Assess for changes in cognitive function

## 2022-06-14 NOTE — Anesthesia Preprocedure Evaluation (Signed)
Anesthesia Evaluation    AIRWAY    Mallampati: III    TM distance: >3 FB  Neck ROM: full  Mouth Opening:full  Planned to use difficult airway equipment: Yes CARDIOVASCULAR    cardiovascular exam normal       DENTAL    no notable dental hx               PULMONARY    pulmonary exam normal     OTHER FINDINGS                                      Relevant Problems   GU/RENAL   (+) Liver laceration, major, initial encounter               Anesthesia Plan    ASA 3     general                     Detailed anesthesia plan: general endotracheal        Post op pain management: per surgeon and PNB single shot    informed consent obtained    Plan discussed with attending and CRNA.    ECG reviewed  pertinent labs reviewed               Signed by: Hessie Diener, MD 06/14/22 7:12 AM

## 2022-06-14 NOTE — Progress Notes (Signed)
Compartment check    Comfortable, minimal pain RLE. Returning sensation s/p block. Family member bedside.    PE:  No pain with PROM  Flickers FHL. No GSC/TA/EHL motor currently  SILT sp/dp/t  2+ DP  Compartments soft, compressible    Overall, no evidence of compartment syndrome at this time.    Will continue to monitor. Please call/page w/ any concerns.    Durward Mallard, MD  PGY-4, Orthopaedic Surgery  Orthopaedics Pager: [phone]  Orthopaedics Phone: [phone]

## 2022-06-14 NOTE — OT Progress Note (Signed)
[facility]   Occupational Therapy Cancellation Note      Patient:  Laura Mclaughlin MRN#:  [id number]  Unit:  [place] TOWER MAIN OPERATING ROOM Room/Bed:  [facility]    06/14/2022  Time: 8:21 AM        Patient not seen for occupational therapy secondary to patient in the OR. Please place new OT orders and weightbearing precautions post op when patient appropriate to participate with therapy.    Thank you,  Juanetta Beets, OTR/L  Pager # [phone]

## 2022-06-14 NOTE — Op Note (Signed)
Procedure Date: 06/14/2022    Patient Type: I    SURGEON: Timoteo Ace, MD    ASSISTANT:  Rush Landmark, MD    PREOPERATIVE DIAGNOSES:  1.  Right bicondylar tibial plateau fracture.  2.  Right comminuted intraarticular fracture of the distal radius, length   unstable.    POSTOPERATIVE DIAGNOSES:  1.  Right bicondylar tibial plateau fracture.  2.  Right comminuted intraarticular fracture of the distal radius, length   unstable.  3.  Right peripheral lateral meniscal tear.    TITLE OF PROCEDURE:  1.  Open reduction and internal fixation of right bicondylar tibial plateau  fracture (16109).  2.  Open treatment with repair of lateral meniscal tear (60454).  3.  Closed manipulation of right intra-articular distal radius fracture   (09811).  4.  Application of multiplanar external fixator spanning right wrist (91478).    ANESTHESIA:  General.    ESTIMATED BLOOD LOSS:  100 mL.    DRAINS:  None.    SPECIMENS:  None.    COMPLICATIONS:  None.    INDICATIONS FOR PROCEDURE:  Please see previous consultations for full detail.  The nature of the   patient's injuries including her tibial plateau and right distal radius   were discussed at length and she has provided informed consent to proceed   with open reduction internal fixation of her tibial plateau and external   fixation of her distal radius.    DESCRIPTION OF PROCEDURE:  The patient was correctly identified in the preoperative holding area.  The  right lower extremity was marked.  She was brought to the operating room   where she surrendered to general anesthesia.  She was placed on the table   in supine position with care taken to pad all bony prominences.  The right   lower and right upper extremity were then prepped and draped in usual   sterile fashion. A timeout was conducted verifying the operative site, the administration of perioperative antibiotics, anesthesia concerns, and availability of all necessary equipment.    Attention was first directed to the  patient's right upper extremity.  A 2   cm incision was made in the mid dorsal aspect of the forearm near the   radial side.  Dissection was taken down through skin and subcutaneous   tissues.  Care was taken to protect the superficial peroneal nerve.  The   radial shaft was exposed and two Stryker 4 mm pins were inserted.  Two more  4 mm pins were inserted into the index metacarpal through stab incisions.    Fluoroscopic images demonstrated satisfactory pin placement.  A combination  of traction and manual manipulation were used to restore length, alignment   and rotation to the patient's distal radius fracture.  The clamps were then  tightened and fluoroscopic images demonstrated satisfactory reduction and   fixation.    Attention was directed to the patient's tibial plateau fracture.  The limb   was exsanguinated and tourniquet inflated to 300 mmHg.  An anterolateral   approach to the proximal tibia was made.  Dissection was taken down through  skin and subcutaneous tissues.  The anterior compartment fascia was incised  extending into the iliotibial band fascia.  This was dissected anteriorly   and posteriorly.  The soft tissues were elevated off of the proximal   lateral aspect of the tibia, exposing the fracture site.  A submeniscal   arthrotomy was made and we visualized a lateral meniscal tear.  We booked open  the lateral split portion of the patient's fracture utilizing an osteotome   and Cobb elevator.  We then repaired the lateral meniscus with #1 Ethibond   suture utilizing mattress sutures.  The meniscal capsular junction was   tagged with Ethibond suture for subsequent repair back down to the proximal  tibia.    The patient's depressed portion of the lateral plateau fracture was   visualized and carefully elevated utilizing an osteotome and tamps.    K-wires were used to maintain the reduced position and cancellous allograft  chips were used to backfill the defect left behind from elevating the    plateau.  The lateral portions of the fracture were then reduced into an   appropriate position and held provisionally with K-wires.  Fluoroscopic   images demonstrated satisfactory reduction and alignment.  The medial   portion of the patient's plateau fractures were well aligned and plan was   to fix them with the locking screws placed from lateral to medial.  We did   apply a laterally based variable angle proximal tibial locking plate.  It   was secured proximally and distally with K-wires and a cortical screw   placed opposing the plate to the bone nicely and buttressing the lateral   fragments.  A total of 7 locking screws were placed in the proximal portion  of the fracture affixing both the lateral and medial portions of the   plateau.  A total of 4 bicortical screws were placed distally with the   proximal most being a locking screw, completing the fixation construct.    Final fluoroscopic images demonstrated satisfactory reduction and fixation.   Of note, the sutures from the meniscal capsular junction were passed   through the proximal holes in the plate and tied down to secure this   repair.  Tourniquet was released at approximately 1 hour and 45 minutes and  hemostasis was obtained.  We irrigated the wound thoroughly with normal   saline.  Vancomycin and tobramycin powder were placed deep overlying the   plate and bone.  The deep tissues were then closed with 0 Monocryl suture.   Subcutaneous tissues were closed with 2-0 Monocryl and the skin was closed   with staples.  Sterile dressings were applied followed by a gentle Ace   bandage.  Sterile dressings were applied to the patient's external fixator   pin sites and forearm as well.  The patient was then awoken from anesthesia  without event and was transferred to the recovery room in stable condition.    POSTOPERATIVE PLAN:  The patient will be platform weightbearing to the right elbow and   nonweightbearing to the bilateral lower extremities.  She  will require   additional surgery in the form of open reduction internal fixation of her   right distal radius in the next 7 to 10 days.  She will receive 24 hours of  IV antibiotics as well as ongoing Lovenox for DVT prophylaxis.      D: 06/14/2022 03:20 PM by Timoteo Ace, MD ([id number])  T: 06/14/2022 03:46 PM by Harriet Pho [id number] (Conf: [id number]) (Doc   ID: [id number])

## 2022-06-14 NOTE — PT Progress Note (Signed)
[facility]   Physical Therapy Cancellation Note      Patient:  Laura Mclaughlin MRN#:  [id number]  Unit:  [place] TOWER MAIN PREOPERATIVE Room/Bed:  FXMAINPREOP/FXMAINPREOP    06/14/2022  Time: 0737    Pt not seen for physical therapy secondary to pt off unit in the OR. Please place resume orders and weightbearing precautions as appropriate.     Kem Parkinson, PT, DPT  Pager [phone]

## 2022-06-14 NOTE — Plan of Care (Signed)
Problem: Moderate/High Fall Risk Score >5  Goal: Patient will remain free of falls  Outcome: Progressing  Flowsheets (Taken 06/13/2022 2000)  High (Greater than 13):   HIGH-Consider use of low bed   HIGH-Initiate use of floor mats as appropriate   HIGH-Pharmacy to initiate evaluation and intervention per protocol   HIGH-Apply yellow "Fall Risk" arm band   HIGH-Utilize chair pad alarm for patient while in the chair   HIGH-Bed alarm on at all times while patient in bed   HIGH-Visual cue at entrance to patient's room   MOD-Place Fall Risk level on whiteboard in room   MOD-Request PT/OT consult order for patients with gait/mobility impairment   MOD-Include family in multidisciplinary POC discussions   MOD-Perform dangle, stand, walk (DSW) prior to mobilization   MOD-Utilize diversion activities   MOD-Re-orient confused patients   MOD-Use of assistive devices -Bedside Commode if appropriate   MOD-Remain with patient during toileting   MOD-Consider a move closer to Nurses Station   MOD-Use of chair-pad alarm when appropriate   LOW-Anticoagulation education for injury risk   LOW-Fall Interventions Appropriate for Low Fall Risk     Problem: Impaired Mobility  Goal: Mobility/Activity is maintained at optimal level for patient  Outcome: Progressing  Flowsheets (Taken 06/14/2022 0022)  Mobility/activity is maintained at optimal level for patient:   Increase mobility as tolerated/progressive mobility   Encourage independent activity per ability   Perform active/passive ROM   Maintain proper body alignment   Plan activities to conserve energy, plan rest periods   Reposition patient every 2 hours and as needed unless able to reposition self   Assess for changes in respiratory status, level of consciousness and/or development of fatigue   Consult/collaborate with Physical Therapy and/or Occupational Therapy     Problem: Compromised Tissue integrity  Goal: Damaged tissue is healing and protected  Outcome: Progressing  Flowsheets  (Taken 06/14/2022 0022)  Damaged tissue is healing and protected:   Monitor/assess Braden scale every shift   Provide wound care per wound care algorithm   Reposition patient every 2 hours and as needed unless able to reposition self   Increase activity as tolerated/progressive mobility   Relieve pressure to bony prominences for patients at moderate and high risk   Avoid shearing injuries   Use bath wipes, not soap and water, for daily bathing   Keep intact skin clean and dry   Use incontinence wipes for cleaning urine, stool and caustic drainage. Foley care as needed   Monitor external devices/tubes for correct placement to prevent pressure, friction and shearing   Encourage use of lotion/moisturizer on skin   Monitor patient's hygiene practices   Utilize specialty bed   Consult/collaborate with wound care nurse   Consider placing an indwelling catheter if incontinence interferes with healing of stage 3 or 4 pressure injury
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
## 2022-06-15 ENCOUNTER — Encounter: Payer: Self-pay | Admitting: Orthopaedic Surgery

## 2022-06-15 LAB — CBC
Absolute NRBC: 0.06 10*3/uL — ABNORMAL HIGH (ref 0.00–0.00)
Hematocrit: 21.8 % — ABNORMAL LOW (ref 34.7–43.7)
Hgb: 7 g/dL — ABNORMAL LOW (ref 11.4–14.8)
MCH: 29.5 pg (ref 25.1–33.5)
MCHC: 32.1 g/dL (ref 31.5–35.8)
MCV: 92 fL (ref 78.0–96.0)
MPV: 9.6 fL (ref 8.9–12.5)
Nucleated RBC: 0.6 /100 WBC — ABNORMAL HIGH (ref 0.0–0.0)
Platelets: 364 10*3/uL — ABNORMAL HIGH (ref 142–346)
RBC: 2.37 10*6/uL — ABNORMAL LOW (ref 3.90–5.10)
RDW: 14 % (ref 11–15)
WBC: 10.69 10*3/uL — ABNORMAL HIGH (ref 3.10–9.50)

## 2022-06-15 LAB — BASIC METABOLIC PANEL
Anion Gap: 8 (ref 5.0–15.0)
BUN: 13 mg/dL (ref 7.0–21.0)
CO2: 21 mEq/L (ref 17–29)
Calcium: 8.4 mg/dL — ABNORMAL LOW (ref 8.5–10.5)
Chloride: 108 mEq/L (ref 99–111)
Creatinine: 0.7 mg/dL (ref 0.4–1.0)
Glucose: 110 mg/dL — ABNORMAL HIGH (ref 70–100)
Potassium: 3.9 mEq/L (ref 3.5–5.3)
Sodium: 137 mEq/L (ref 135–145)
eGFR: >60 mL/min/{1.73_m2} (ref >=60)

## 2022-06-15 LAB — PHOSPHORUS: Phosphorus: 3.4 mg/dL (ref 2.3–4.7)

## 2022-06-15 LAB — MAGNESIUM: Magnesium: 1.8 mg/dL (ref 1.6–2.6)
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
## 2022-06-15 NOTE — OT Progress Note (Signed)
Occupational Therapy Treatment Laura Mclaughlin        Post Acute Care Therapy Recommendations:     Discharge Recommendations:  Acute Rehab      If Acute Rehab  recommended discharge disposition is not available, patient will need max assist for ADLs/functional mobility and HHOT.     DME needs IF patient is discharging home: Wheelchair-manual (drop arm BSC, transport into home)    Therapy discharge recommendations may change with patient status.  Please refer to most recent note for up-to-date recommendations.    Patient anticipated to benefit from and to be able to engage in 3 hours of therapy a day for 5 days a week.     Assessment:   Significant Findings: none     Patient agreeable to therapy this session, received in bed. S/p ex fix application at R wrist, received new orders. Limited by nausea and dizziness at EOB (vitals stable), however able to sit with SBA-CGA in prep for EOB ADLs, Max A x2 for supine <> sit. Educated on UE positioning and AROM, reported good understanding. Patient will continue to benefit from acute OT services.        Patient left without needs and call bell within reach. RN notified of session outcome.       Treatment Activities: ADL retraining, Functional transfer training, and UE strengthening/ROM    Educated the patient to role of occupational therapy, plan of care, goals of therapy and HEP, safety with mobility and ADLs.    Plan:   OT Frequency Recommended: 3-4x/wk     Goal Formulation: Patient  OT Plan  Risks/Benefits/POC Discussed with Pt/Family: With patient  Treatment Interventions: ADL retraining, Functional transfer training, UE strengthening/ROM, Endurance training, Patient/Family training  Discharge Recommendation: Acute Rehab  DME Recommended for Discharge: Wheelchair-manual (drop arm BSC, transport into home)  OT Frequency Recommended: 3-4x/wk  OT - Next Visit Recommendation: 06/18/22    Continue plan of care.    Unit: [facility] TOWER 3  Bed: F332/F332.01          Precautions and Contraindications:   Precautions  Weight Bearing Status: RUE platform weight bearing, RLE non weight bearing, LLE non weight bearing  Back Brace Applied: yes, at all times (CTO)  Spinal Precautions: no bending, no twisting, no lifting  Other Precautions: High Fall Risk    Updated Medical Status/Imaging/Labs:  Reviewed     Subjective: "I'm pretty nauseous"   Patient's medical condition is appropriate for Occupational Therapy intervention at this time.  Patient is agreeable to participation in the therapy session.    Pain Assessment  Pain Assessment: Wong-Baker FACES  Wong-Baker FACES Pain Rating: Hurts even more (R side, repositioned)   .           Objective:   Observation of Patient/Vital Signs:  Patient is in bed with CTO, telemetry, IV access, ex cath in place.  Pt wore mask during therapy session:No      Cognition/Neuro Status  Arousal/Alertness: Appropriate responses to stimuli  Attention Span: Appears intact  Orientation Level: Oriented X4  Memory: Appears intact  Following Commands: Follows all commands and directions without difficulty  Safety Awareness: minimal verbal instruction  Insights: Educated in safety awareness  Problem Solving: minimal assistance  Behavior: cooperative;calm    Functional Mobility  Supine to Sit Transfers: Maximal Assist (x2)  Sit to Supine Transfers: Maximal Assist (x2)    Self-care and Home Management  UB Dressing: Maximal Assist  LB Dressing: Maximal Assist  Functional  Transfers: Maximal Assist (x2)    Therapeutic Exercises  Shoulder AROM:  (shoulder shrugs)  Elbow AROM: Flexion;Extension  Hand ROM: MP flexion (swollen)                   PMP Activity: Step 4 - Dangle at Bedside               Participation: good  Endurance: fair    Patient left with call bell within reach, all needs met, SCDs off, fall mat on, bed alarm on, chair alarm off and all questions answered. RN notified of session outcome and patient response.     Goals:    Time For Goal  Achievement: 5 visits  ADL Goals  Patient will groom self: Contact Guard Assist  Patient will dress upper body: Minimal Assist  Patient will dress lower body: Minimal Assist, with AE  Patient will toilet: Moderate Assist  Mobility and Transfer Goals  Pt will perform functional transfers: Moderate Assist                         PPE worn during session: gloves    Tech present: No   PPE worn by tech: N/A    Time of Treatment  OT Received On: 06/15/22  Start Time: 1130  Stop Time: 1210  Time Calculation (min): 40 min  Treatment # 1 of 5 visits    Juanetta Beets, OTR/L  Pager [phone]

## 2022-06-15 NOTE — Progress Notes (Addendum)
Orthopedic Trauma Daily Progress Note    06/15/2022 6:00 AM    Accalia Elenie Coven is a 61 y.o. female with left intraarticular distal femur fracture, right tibial plateau fracture and right intraarticular distal radius fracture. S/p ORIF of L distal femur on 8/7.  Status post right tibial plateau open reduction internal fixation, closed reduction right wrist and application of right wrist spanning external fixator on 8/10.    Compartment checks overnight unremarkable    Subjective: NAEON. Pain controlled  Patient Denies nausea, vomiting, or fevers.     Physical Exam:  Vitals:    06/15/22 0500   BP: 98/52   Pulse:    Resp:    Temp: 98.7 F (37.1 C)   SpO2:         Intake/Output Summary (Last 24 hours) at 06/15/2022 0600  Last data filed at 06/15/2022 0500  Gross per 24 hour   Intake 2455 ml   Output 2800 ml   Net -345 ml       Lab Results   Component Value Date    INR 1.2 (H) 06/09/2022       right upper extremity:  Ex-Fix clean dry and intact  +EPL, FPL, unable to fire IO  Sensation intact light touch Rad/Med, decreased over ulnar nerve  Cap refill < 2 seconds  Rad pulse palpable  No pain with passive ROM digits    left lower extremity:  Dressings c/d/i  +TA,GS  +EHL,FHL  Sensation intact light touch dorsal/plantar foot  Cap refill < 2 seconds  Pedal pulses palpable  No pain with passive ROM digits    right lower extremity:  No pain with PROM  Flickers FHL. No GSC/TA/EHL motor currently  SILT sp/dp/t  2+ DP  Compartments soft, compressible    Lab Results   Component Value Date    HGB 9.0 (L) 06/11/2022    HGB 9.3 (L) 06/10/2022    HGB 10.4 (L) 06/09/2022      Lab Results   Component Value Date    HCT 27.8 (L) 06/11/2022    HCT 28.0 (L) 06/10/2022    HCT 31.1 (L) 06/09/2022    Review of pertinent labs demonstrate:   - Hemodynamic stability with no significant changes.    Assessment: 61 y.o. female with left intraarticular distal femur fracture, right tibial plateau fracture and right intraarticular distal radius  fracture. S/p ORIF of L distal femur on 8/7.  Status post right tibial plateau open reduction internal fixation, closed reduction right wrist and application of right wrist spanning external fixator on 8/10.    Plan:   - Mobility: PT/OT   - Pain control: Continue to wean/titrate to appropriate oral regimen   - DVT Prophylaxis (per Ortho Trauma service Protocol): - Usual Ortho Trauma protocol would be chemical prophylaxis for 6 weeks, but defer to primary team given confounding factors   - Foley catheter status: Does not have Foley   - Further surgical plans: Delayed fixation of right distal radius scheduled time on 8/22    - RUE: NWB   - LUE:  WBAT   - RLE:  NWB   - LLE:  NWB    - No transfusion indicated, but will continue to follow serial labs and titrate IV fluids & volume expanders as indicated to optimize hemodynamics/volume status.   - Disposition: Pending physical therapy  R Linton Flemings II, MD    Attending Addendum/Attestation:     I have personally seen and examined this patient and have  participated in their care. I agree with the clinical information, including the physical exam and patient history as documented by the Resident/Advanced Practice Provider.  The above note has been edited to reflect my findings as well as incorporate supplementary history and exam that I have obtained.  Using all of the relevant patient information, I have performed and documented the Medical Decision Making in its entirety.    Christy Gentles, MD  Orthopaedic Trauma  Pager [phone]  Office [phone]

## 2022-06-15 NOTE — Plan of Care (Signed)
Problem: Moderate/High Fall Risk Score >5  Goal: Patient will remain free of falls  Outcome: Progressing  Flowsheets (Taken 06/15/2022 0800)  High (Greater than 13):   HIGH-Consider use of low bed   HIGH-Initiate use of floor mats as appropriate   HIGH-Pharmacy to initiate evaluation and intervention per protocol   HIGH-Apply yellow "Fall Risk" arm band   HIGH-Utilize chair pad alarm for patient while in the chair   HIGH-Bed alarm on at all times while patient in bed   HIGH-Visual cue at entrance to patient's room     Problem: Pain interferes with ability to perform ADL  Goal: Pain at adequate level as identified by patient  Outcome: Progressing  Flowsheets (Taken 06/15/2022 2007)  Pain at adequate level as identified by patient:   Identify patient comfort function goal   Assess for risk of opioid induced respiratory depression, including snoring/sleep apnea. Alert healthcare team of risk factors identified.   Assess pain on admission, during daily assessment and/or before any "as needed" intervention(s)   Evaluate if patient comfort function goal is met   Offer non-pharmacological pain management interventions   Consult/collaborate with Physical Therapy, Occupational Therapy, and/or Speech Therapy   Include patient/patient care companion in decisions related to pain management as needed   Evaluate patient's satisfaction with pain management progress   Reassess pain within 30-60 minutes of any procedure/intervention, per Pain Assessment, Intervention, Reassessment (AIR) Cycle   Consult/collaborate with Pain Service     Problem: Impaired Mobility  Goal: Mobility/Activity is maintained at optimal level for patient  Outcome: Progressing  Flowsheets (Taken 06/15/2022 2007)  Mobility/activity is maintained at optimal level for patient:   Increase mobility as tolerated/progressive mobility   Perform active/passive ROM   Reposition patient every 2 hours and as needed unless able to reposition self   Assess for changes in  respiratory status, level of consciousness and/or development of fatigue   Consult/collaborate with Physical Therapy and/or Occupational Therapy   Plan activities to conserve energy, plan rest periods   Encourage independent activity per ability   Maintain proper body alignment

## 2022-06-15 NOTE — Progress Notes (Signed)
TACS Nursing Progress Note    Laura Mclaughlin is a 61 y.o. female  Admitted 06/09/2022  5:14 PM [facility](Hospital day 6) for Liver hematoma, initial encounter [S36.112A]  Closed nondisplaced fracture of second cervical vertebra, unspecified fracture morphology, initial encounter [S12.101A]  Displaced bicondylar fracture of right tibia, initial encounter for closed fracture [S82.141A]    Major Shift Events:  NME   Pain controlled with PRN and scheduled meds  Evaluated bY PT oT   Q2 Turns   Review of Systems  Neuro:  Q4 Neuro and NVSC intact  Q2 turns   FC   ORIF RLE, ex fix RUE  NWB BLE, RUE but platform is okay    CTO at all times    Cardiac:  Temp:  [98 F (36.7 C)-98.8 F (37.1 C)] 98 F (36.7 C)  Heart Rate:  [86-94] 90  Resp Rate:  [17-23] 22  BP: (98-133)/(52-77) 133/72      Respiratory:  RA  Lung sounds clear  IS at bedside    GI/GU:  Voids to ex cath  LBM PTA  Bariatric Diet  Active BS    BM this shift? No    Skin Assessment  Skin Integrity: Abrasion, Other(Comment), Bruising (sx incision)  Abrasion Skin Location: scattered; forehead  Bruising Skin Location: LUE, scattered     Braden Scale Score: 15 (06/14/22 2000)       LDAWs  Patient Lines/Drains/Airways Status       Active Lines, Drains and Airways       Name Placement date Placement time Site Days    Peripheral IV 06/12/22 18 G Anterior;Distal;Left Antecubital 06/12/22  0823  Antecubital  3    Peripheral IV 06/10/22 20 G Anterior;Left;Proximal Forearm 06/10/22  1000  Forearm  5    External Urinary Catheter 06/14/22  1754  --  1                   Wound 06/09/22 Traumatic Head Right;Upper Laceration R forehead (Active)   Date First Assessed: 06/09/22   Wound Type: Traumatic  Location: Head  Wound Location Orientation: Right;Upper  Wound Description (Comments): Laceration R forehead  Present on Admission: Yes      Assessments 06/10/2022 12:30 AM 06/14/2022  8:00 PM   Site Description Pink;Red Pink;Dry   Peri-wound Description Pink Dry;Intact   Closure Sutures  Sutures;Open to air   Drainage Amount None None   Dressing Open to air Open to air       No associated orders.       Wound 06/10/22 Surgical Incision Abdomen lap sites (Active)   Date First Assessed/Time First Assessed: 06/10/22 0030   Wound Type: Surgical Incision  Location: Abdomen  Wound Description (Comments): lap sites  Present on Admission: Yes      Assessments 06/10/2022 12:30 AM 06/13/2022  8:00 PM   Site Description Dressing covering site (UTA) Dressing covering site (UTA)   Peri-wound Description Dressing covering site (UTA) Dressing covering site (UTA)   Closure Steri-strips;Approximated --   Drainage Amount None None   Dressing Steri-strips --   Dressing Status Clean;Dry;Intact --       No associated orders.       Wound 06/11/22 Surgical Incision Leg Left Xeroform, 4x4's,  (Active)   Date First Assessed/Time First Assessed: 06/11/22 1543   Wound Type: Surgical Incision  Location: Leg  Wound Location Orientation: Left  Wound Description (Comments): Xeroform, 4x4's,       Assessments 06/11/2022  6:22 PM 06/13/2022  8:00 PM   Site Description Dressing covering site (UTA) Dry;Intact   Peri-wound Description Dressing covering site (UTA) Pink   Closure Dressing covering site (UTA) Dressing covering site (UTA)   Drainage Amount None None   Dressing Ace Wrap Ace Wrap   Dressing Changed New --   Dressing Status Clean;Dry;Intact --       No associated orders.       Wound 06/14/22 Surgical Incision Leg Right (Active)   Date First Assessed/Time First Assessed: 06/14/22 1109   Wound Type: Surgical Incision  Location: Leg  Wound Location Orientation: Right      Assessments 06/14/2022 11:45 AM 06/14/2022  8:00 PM   Site Description Dressing covering site (UTA) Dressing covering site (UTA)   Peri-wound Description Dressing covering site (UTA) Dressing covering site (UTA)   Closure Dressing covering site (UTA) Dressing covering site (UTA)   Drainage Amount Dressing covering site (UTA) Dressing covering site (UTA)   Dressing Ace  Wrap;Xeroform;Abdominal Dressing;Gauze Ace Wrap;Xeroform   Dressing Changed New --   Dressing Status Clean;Dry;Intact Clean;Dry;Intact       No associated orders.       Wound 06/14/22 Surgical Incision Arm Anterior;Right;Lower (Active)   Date First Assessed/Time First Assessed: 06/14/22 1145   Wound Type: Surgical Incision  Location: Arm  Wound Location Orientation: Anterior;Right;Lower      Assessments 06/14/2022 11:45 AM 06/14/2022  8:00 PM   Site Description Dressing covering site (UTA) Dressing covering site (UTA)   Peri-wound Description Clean;Dry;Intact Clean;Dry;Intact   Closure Dressing covering site (UTA) Dressing covering site (UTA)   Dressing Gauze Bandage Roll;Tape Gauze Bandage Roll;Tape;Xeroform   Dressing Changed New --   Dressing Status Clean;Dry;Intact Clean;Dry;Intact       No associated orders.       External Fixator Pin Site Lower Arm (Active)   Placement Date: 06/14/22   Number of Pin Sites: 3  External Fixator Location: Lower Arm  External Fixator Orientation: Right      No assessment data to display       No associated orders.         Psycho/Social:  Calm and cooperative    Disposition for Discharge:TBD;

## 2022-06-15 NOTE — Progress Notes (Addendum)
TACS Nursing Progress Note    Laura Mclaughlin is a 61 y.o. female  Admitted 06/09/2022  5:14 PM [facility] day 6) for Liver hematoma, initial encounter [S36.112A]  Closed nondisplaced fracture of second cervical vertebra, unspecified fracture morphology, initial encounter [S12.101A]  Displaced bicondylar fracture of right tibia, initial encounter for closed fracture [S82.141A]        Major Shift Events:  Dilaudid x2  Oxy x2  Pain mngmt      Review of Systems  Neuro:  Q4 Neuro and NVSC intact  Q2 turns   FC   ORIF RLE, ex fix RUE  NWB BLE, RUE but platform is okay    CTO at all times    Cardiac:  Temp:  [97 F (36.1 C)-98.3 F (36.8 C)] 98.3 F (36.8 C)  Heart Rate:  [80-100] 86  Resp Rate:  [14-23] 21  BP: (104-152)/(64-78) 104/64      Respiratory:  RA  Lung sounds clear  IS at bedside    GI/GU:  Voids to ex cath  LBM PTA  Bariatric Diet  Active BS    BM this shift? No    Skin Assessment  Skin Integrity: Abrasion, Other(Comment), Bruising (sx incision)  Abrasion Skin Location: scattered; forehead  Bruising Skin Location: LUE, scattered     Braden Scale Score: 15 (06/14/22 2000)       LDAWs  Patient Lines/Drains/Airways Status       Active Lines, Drains and Airways       Name Placement date Placement time Site Days    Peripheral IV 06/12/22 18 G Anterior;Distal;Left Antecubital 06/12/22  0823  Antecubital  2    Peripheral IV 06/10/22 20 G Anterior;Left;Proximal Forearm 06/10/22  1000  Forearm  4    External Urinary Catheter 06/14/22  1754  --  less than 1                   Wound 06/09/22 Traumatic Head Right;Upper Laceration R forehead (Active)   Date First Assessed: 06/09/22   Wound Type: Traumatic  Location: Head  Wound Location Orientation: Right;Upper  Wound Description (Comments): Laceration R forehead  Present on Admission: Yes      Assessments 06/10/2022 12:30 AM 06/14/2022  8:00 PM   Site Description Pink;Red Pink;Dry   Peri-wound Description Pink Dry;Intact   Closure Sutures Sutures;Open to air   Drainage  Amount None None   Dressing Open to air Open to air       No associated orders.       Wound 06/10/22 Surgical Incision Abdomen lap sites (Active)   Date First Assessed/Time First Assessed: 06/10/22 0030   Wound Type: Surgical Incision  Location: Abdomen  Wound Description (Comments): lap sites  Present on Admission: Yes      Assessments 06/10/2022 12:30 AM 06/13/2022  8:00 PM   Site Description Dressing covering site (UTA) Dressing covering site (UTA)   Peri-wound Description Dressing covering site (UTA) Dressing covering site (UTA)   Closure Steri-strips;Approximated --   Drainage Amount None None   Dressing Steri-strips --   Dressing Status Clean;Dry;Intact --       No associated orders.       Wound 06/11/22 Surgical Incision Leg Left Xeroform, 4x4's,  (Active)   Date First Assessed/Time First Assessed: 06/11/22 1543   Wound Type: Surgical Incision  Location: Leg  Wound Location Orientation: Left  Wound Description (Comments): Xeroform, 4x4's,       Assessments 06/11/2022  6:22 PM 06/13/2022  8:00 PM  Site Description Dressing covering site (UTA) Dry;Intact   Peri-wound Description Dressing covering site (UTA) Pink   Closure Dressing covering site (UTA) Dressing covering site (UTA)   Drainage Amount None None   Dressing Ace Wrap Ace Wrap   Dressing Changed New --   Dressing Status Clean;Dry;Intact --       No associated orders.       Wound 06/14/22 Surgical Incision Leg Right (Active)   Date First Assessed/Time First Assessed: 06/14/22 1109   Wound Type: Surgical Incision  Location: Leg  Wound Location Orientation: Right      Assessments 06/14/2022 11:45 AM 06/14/2022  8:00 PM   Site Description Dressing covering site (UTA) Dressing covering site (UTA)   Peri-wound Description Dressing covering site (UTA) Dressing covering site (UTA)   Closure Dressing covering site (UTA) Dressing covering site (UTA)   Drainage Amount Dressing covering site (UTA) Dressing covering site (UTA)   Dressing Ace Wrap;Xeroform;Abdominal  Dressing;Gauze Ace Wrap;Xeroform   Dressing Changed New --   Dressing Status Clean;Dry;Intact Clean;Dry;Intact       No associated orders.       Wound 06/14/22 Surgical Incision Arm Anterior;Right;Lower (Active)   Date First Assessed/Time First Assessed: 06/14/22 1145   Wound Type: Surgical Incision  Location: Arm  Wound Location Orientation: Anterior;Right;Lower      Assessments 06/14/2022 11:45 AM 06/14/2022  8:00 PM   Site Description Dressing covering site (UTA) Dressing covering site (UTA)   Peri-wound Description Clean;Dry;Intact Clean;Dry;Intact   Closure Dressing covering site (UTA) Dressing covering site (UTA)   Dressing Gauze Bandage Roll;Tape Gauze Bandage Roll;Tape;Xeroform   Dressing Changed New --   Dressing Status Clean;Dry;Intact Clean;Dry;Intact       No associated orders.       External Fixator Pin Site Lower Arm (Active)   Placement Date: 06/14/22   Number of Pin Sites: 3  External Fixator Location: Lower Arm  External Fixator Orientation: Right      No assessment data to display       No associated orders.         Psycho/Social:  Calm and cooperative    Disposition for Discharge:TBD; needs ORIF R radius (TBD)
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
## 2022-06-16 DIAGNOSIS — G8911 Acute pain due to trauma: Secondary | ICD-10-CM

## 2022-06-16 DIAGNOSIS — S12190A Other displaced fracture of second cervical vertebra, initial encounter for closed fracture: Secondary | ICD-10-CM

## 2022-06-16 LAB — CBC
Absolute NRBC: 0.06 10*3/uL — ABNORMAL HIGH (ref 0.00–0.00)
Hematocrit: 23.9 % — ABNORMAL LOW (ref 34.7–43.7)
Hgb: 7.6 g/dL — ABNORMAL LOW (ref 11.4–14.8)
MCH: 29.3 pg (ref 25.1–33.5)
MCHC: 31.8 g/dL (ref 31.5–35.8)
MCV: 92.3 fL (ref 78.0–96.0)
MPV: 9.5 fL (ref 8.9–12.5)
Nucleated RBC: 0.5 /100 WBC — ABNORMAL HIGH (ref 0.0–0.0)
Platelets: 402 10*3/uL — ABNORMAL HIGH (ref 142–346)
RBC: 2.59 10*6/uL — ABNORMAL LOW (ref 3.90–5.10)
RDW: 15 % (ref 11–15)
WBC: 11.12 10*3/uL — ABNORMAL HIGH (ref 3.10–9.50)

## 2022-06-16 LAB — ANTI-XA, LMWH: Anti-Xa, LMWH: 0.3 U/mL
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
## 2022-06-16 NOTE — Plan of Care (Signed)
Problem: Moderate/High Fall Risk Score >5  Goal: Patient will remain free of falls  Outcome: Progressing  Flowsheets (Taken 06/16/2022 0800)  Moderate Risk (6-13):   MOD-Remain with patient during toileting   MOD-Floor mat at bedside (where available) if appropriate   MOD-Apply bed exit alarm if patient is confused   MOD-Consider activation of bed alarm if appropriate   MOD-Consider a move closer to Nurses Station   MOD-Place bedside commode and assistive devices out of sight when not in use   MOD-Re-orient confused patients   MOD-Utilize diversion activities   MOD-Perform dangle, stand, walk (DSW) prior to mobilization   MOD-Request PT/OT consult order for patients with gait/mobility impairment   MOD-Use gait belt when appropriate   MOD-include family in multidisciplinary POC discussions   MOD- Consider video monitoring     Problem: Pain interferes with ability to perform ADL  Goal: Pain at adequate level as identified by patient  Outcome: Progressing  Flowsheets (Taken 06/15/2022 2007 by Carylon Perches, Astha, RN)  Pain at adequate level as identified by patient:   Identify patient comfort function goal   Assess for risk of opioid induced respiratory depression, including snoring/sleep apnea. Alert healthcare team of risk factors identified.   Assess pain on admission, during daily assessment and/or before any "as needed" intervention(s)   Evaluate if patient comfort function goal is met   Offer non-pharmacological pain management interventions   Consult/collaborate with Physical Therapy, Occupational Therapy, and/or Speech Therapy   Include patient/patient care companion in decisions related to pain management as needed   Evaluate patient's satisfaction with pain management progress   Reassess pain within 30-60 minutes of any procedure/intervention, per Pain Assessment, Intervention, Reassessment (AIR) Cycle   Consult/collaborate with Pain Service     Problem: Impaired Mobility  Goal: Mobility/Activity is  maintained at optimal level for patient  Outcome: Progressing  Flowsheets (Taken 06/15/2022 2007 by Carylon Perches, Astha, RN)  Mobility/activity is maintained at optimal level for patient:   Increase mobility as tolerated/progressive mobility   Perform active/passive ROM   Reposition patient every 2 hours and as needed unless able to reposition self   Assess for changes in respiratory status, level of consciousness and/or development of fatigue   Consult/collaborate with Physical Therapy and/or Occupational Therapy   Plan activities to conserve energy, plan rest periods   Encourage independent activity per ability   Maintain proper body alignment
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
## 2022-06-17 NOTE — Plan of Care (Signed)
Problem: Pain interferes with ability to perform ADL  Goal: Pain at adequate level as identified by patient  Outcome: Progressing  Flowsheets (Taken 06/15/2022 2007 by Carylon Perches, Astha, RN)  Pain at adequate level as identified by patient:   Identify patient comfort function goal   Assess for risk of opioid induced respiratory depression, including snoring/sleep apnea. Alert healthcare team of risk factors identified.   Assess pain on admission, during daily assessment and/or before any "as needed" intervention(s)   Evaluate if patient comfort function goal is met   Offer non-pharmacological pain management interventions   Consult/collaborate with Physical Therapy, Occupational Therapy, and/or Speech Therapy   Include patient/patient care companion in decisions related to pain management as needed   Evaluate patient's satisfaction with pain management progress   Reassess pain within 30-60 minutes of any procedure/intervention, per Pain Assessment, Intervention, Reassessment (AIR) Cycle   Consult/collaborate with Pain Service     Problem: Impaired Mobility  Goal: Mobility/Activity is maintained at optimal level for patient  Outcome: Progressing  Flowsheets (Taken 06/15/2022 2007 by Carylon Perches, Astha, RN)  Mobility/activity is maintained at optimal level for patient:   Increase mobility as tolerated/progressive mobility   Perform active/passive ROM   Reposition patient every 2 hours and as needed unless able to reposition self   Assess for changes in respiratory status, level of consciousness and/or development of fatigue   Consult/collaborate with Physical Therapy and/or Occupational Therapy   Plan activities to conserve energy, plan rest periods   Encourage independent activity per ability   Maintain proper body alignment     Problem: Compromised Tissue integrity  Goal: Damaged tissue is healing and protected  Outcome: Progressing  Flowsheets (Taken 06/14/2022 0022 by Oneal Deputy,  RN)  Damaged tissue is healing and protected:   Monitor/assess Braden scale every shift   Provide wound care per wound care algorithm   Reposition patient every 2 hours and as needed unless able to reposition self   Increase activity as tolerated/progressive mobility   Relieve pressure to bony prominences for patients at moderate and high risk   Avoid shearing injuries   Use bath wipes, not soap and water, for daily bathing   Keep intact skin clean and dry   Use incontinence wipes for cleaning urine, stool and caustic drainage. Foley care as needed   Monitor external devices/tubes for correct placement to prevent pressure, friction and shearing   Encourage use of lotion/moisturizer on skin   Monitor patient's hygiene practices   Utilize specialty bed   Consult/collaborate with wound care nurse   Consider placing an indwelling catheter if incontinence interferes with healing of stage 3 or 4 pressure injury
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
## 2022-06-17 NOTE — Plan of Care (Signed)
9 sutures removed from right forehead. Wound integrity remains intact. Pt tolerated the procedure well.    Wilnette Kales, MD  [facility] General Surgery PGY-1

## 2022-06-17 NOTE — Progress Notes (Signed)
CASE MANAGEMENT PROGRESS NOTE        Date Time: 06/17/2022 1012  Patient Name: Laura Mclaughlin  Attending Physician: Larwance Sachs [facility] MD  Hospital Day: 8     Date of Admission: 06/09/2022     Reason for Admission:  Liver hematoma, initial encounter [S36.112A]  Closed nondisplaced fracture of second cervical vertebra, unspecified fracture morphology, initial encounter [S12.101A]  Displaced bicondylar fracture of right tibia, initial encounter for closed fracture [S82.141A]    Discharge order placed.  Call to daughter, Lowanda Foster [phone], as patient phone went straight to VM.  [name] at bedside.  Discussed recommendation to [facility] to Acute Rehab.  Pt and family in agreement and would prefer near [place].  Referral placed to [facility] Acute Rehab, pending acceptance and bed availability.       DISPO: IPR     CM following for hospital course outcomes, Interdisciplinary Team recommendations, discharge readiness and potential barriers to discharge.      Robb Matar BSN, RN, [facility]  Case Management  [employer]  [phone]
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
## 2022-06-18 ENCOUNTER — Other Ambulatory Visit (INDEPENDENT_AMBULATORY_CARE_PROVIDER_SITE_OTHER): Payer: Self-pay | Admitting: Orthopaedic Surgery

## 2022-06-18 DIAGNOSIS — S52571A Other intraarticular fracture of lower end of right radius, initial encounter for closed fracture: Secondary | ICD-10-CM

## 2022-06-18 DIAGNOSIS — R569 Unspecified convulsions: Secondary | ICD-10-CM

## 2022-06-18 LAB — MEDICAL CONSULT WITH PATIENT HISTORY, REFLEX

## 2022-06-18 MED ORDER — ACETAMINOPHEN 325 MG PO TABS
650.0000 mg | ORAL_TABLET | Freq: Four times a day (QID) | ORAL | Status: DC
Start: 2022-06-18 — End: 2022-06-24
  Administered 2022-06-18 – 2022-06-24 (×26): 650 mg via ORAL
  Filled 2022-06-18 (×26): qty 2

## 2022-06-18 MED ORDER — MINERAL OIL RE ENEM
1.0000 | ENEMA | Freq: Once | RECTAL | Status: DC
Start: 2022-06-18 — End: 2022-06-18
  Filled 2022-06-18: qty 1

## 2022-06-18 MED ORDER — FLEET ENEMA 7-19 GM/118ML RE ENEM
1.0000 | ENEMA | Freq: Once | RECTAL | Status: AC
Start: 2022-06-18 — End: 2022-06-18
  Administered 2022-06-18: 1 via RECTAL
  Filled 2022-06-18: qty 1

## 2022-06-18 MED ORDER — METHOCARBAMOL 500 MG PO TABS
500.0000 mg | ORAL_TABLET | Freq: Four times a day (QID) | ORAL | Status: DC
Start: 2022-06-18 — End: 2022-06-24
  Administered 2022-06-18 – 2022-06-23 (×24): 500 mg via ORAL
  Filled 2022-06-18 (×24): qty 1

## 2022-06-18 MED ORDER — METHOCARBAMOL 500 MG PO TABS
750.0000 mg | ORAL_TABLET | Freq: Four times a day (QID) | ORAL | Status: DC
Start: 2022-06-18 — End: 2022-06-18

## 2022-06-18 MED ORDER — ACETAMINOPHEN 500 MG PO TABS
1000.0000 mg | ORAL_TABLET | Freq: Three times a day (TID) | ORAL | Status: DC
Start: 2022-06-18 — End: 2022-06-18

## 2022-06-18 NOTE — OT Progress Note (Signed)
Occupational Therapy Treatment Laura Mclaughlin        Post Acute Care Therapy Recommendations:     Discharge Recommendations:  Acute Rehab      If Acute Rehab  recommended discharge disposition is not available, patient will need up to max assist for ADLs/functional mobility and HHOT.     DME needs IF patient is discharging home: Wheelchair-manual (drop arm BSC, transport into home)    Therapy discharge recommendations may change with patient status.  Please refer to most recent note for up-to-date recommendations.    Patient anticipated to benefit from and to be able to engage in 3 hours of therapy a day for 5 days a week.     Assessment:   Significant Findings: none     Patient agreeable to therapy this session, received with daughter in room. Improvements in RUE AROM (now able to make fist with hand), sitting balance, activity tolerance, and functional mobility this session, required Max A x2 for posterior scoot to chair, good lateral weight shifts to assist with transfer. Continues to require Max A for LB ADLs. Patient will continue to benefit from acute OT services.        Patient left without needs and call bell within reach. RN notified of session outcome.       Treatment Activities: ADL retraining, Functional transfer training, UE strengthening/ROM, and Endurance training    Educated the patient to role of occupational therapy, plan of care, goals of therapy and safety with mobility and ADLs.    Plan:   OT Frequency Recommended: 4-5x/wk     Goal Formulation: Patient  OT Plan  Risks/Benefits/POC Discussed with Pt/Family: With patient  Treatment Interventions: ADL retraining, Functional transfer training, UE strengthening/ROM, Endurance training, Patient/Family training  Discharge Recommendation: Acute Rehab  DME Recommended for Discharge: Wheelchair-manual (drop arm BSC, transport into home)  OT Frequency Recommended: 4-5x/wk  OT - Next Visit Recommendation: 06/19/22    Continue plan of care.    Unit: [facility] TOWER 3  Bed: F332/F332.01         Precautions and Contraindications:   Precautions  Weight Bearing Status: RUE platform weight bearing, RLE non weight bearing, LLE non weight bearing  Back Brace Applied: yes, at all times  Spinal Precautions: no bending, no twisting, no lifting  Other Precautions: Moderate Fall Risk    Updated Medical Status/Imaging/Labs:  Reviewed     Subjective: "I thought that would be harder than it was"   Patient's medical condition is appropriate for Occupational Therapy intervention at this time.  Patient is agreeable to participation in the therapy session.    Pain Assessment  Pain Assessment: Wong-Baker FACES  Wong-Baker FACES Pain Rating: Hurts little more (BLE, repositioned)   .           Objective:   Observation of Patient/Vital Signs:  Patient is in bed with IV access, CTO, telemetry, excath in place.  Pt wore mask during therapy session:No      Cognition/Neuro Status  Arousal/Alertness: Appropriate responses to stimuli  Attention Span: Appears intact  Orientation Level: Oriented X4  Memory: Appears intact  Following Commands: Follows all commands and directions without difficulty  Safety Awareness: minimal verbal instruction  Insights: Educated in safety awareness  Problem Solving: minimal assistance  Behavior: cooperative;calm    Functional Mobility  Scooting Transfers: Moderate Assist (x2)  Bed to Chair Transfers: Maximal Assist (x2 posterior scoot)    Self-care and Home Management  LB Dressing: Maximal Assist  Toileting: Maximal Assist  Functional Transfers: Maximal Assist (x2)    Therapeutic Exercises  Shoulder AROM: Flexion;Extension  Elbow AROM: Flexion;Extension                   PMP Activity: Step 5 - Chair               Participation: good  Endurance: fair+    Patient left with call bell within reach, all needs met, SCDs off, fall mat on, bed alarm off, chair alarm on and all questions answered. RN notified of session outcome and patient response.      Goals:    Time For Goal Achievement: 5 visits  ADL Goals  Patient will groom self: Contact Guard Assist  Patient will dress upper body: Minimal Assist  Patient will dress lower body: Minimal Assist, with AE  Patient will toilet: Moderate Assist  Mobility and Transfer Goals  Pt will perform functional transfers: Moderate Assist                         PPE worn during session: gloves    Tech present: No   PPE worn by tech: N/A    Time of Treatment  OT Received On: 06/18/22  Start Time: 1010  Stop Time: 1055  Time Calculation (min): 45 min  Treatment # 2 of 5 visits    Juanetta Beets, OTR/L  Pager [phone]

## 2022-06-18 NOTE — Plan of Care (Signed)
Problem: Pain interferes with ability to perform ADL  Goal: Pain at adequate level as identified by patient  Outcome: Progressing  Flowsheets (Taken 06/18/2022 0424)  Pain at adequate level as identified by patient:   Identify patient comfort function goal   Assess for risk of opioid induced respiratory depression, including snoring/sleep apnea. Alert healthcare team of risk factors identified.   Reassess pain within 30-60 minutes of any procedure/intervention, per Pain Assessment, Intervention, Reassessment (AIR) Cycle   Assess pain on admission, during daily assessment and/or before any "as needed" intervention(s)   Evaluate if patient comfort function goal is met   Evaluate patient's satisfaction with pain management progress   Consult/collaborate with Pain Service   Offer non-pharmacological pain management interventions   Consult/collaborate with Physical Therapy, Occupational Therapy, and/or Speech Therapy   Include patient/patient care companion in decisions related to pain management as needed     Problem: Side Effects from Pain Analgesia  Goal: Patient will experience minimal side effects of analgesic therapy  Outcome: Progressing  Flowsheets (Taken 06/18/2022 0424)  Patient will experience minimal side effects of analgesic therapy:   Evaluate for opioid-induced sedation with appropriate assessment tool (i.e. POSS)   Prevent/manage side effects per LIP orders (i.e. nausea, vomiting, pruritus, constipation, urinary retention, etc.)   Assess for changes in cognitive function   Monitor/assess patient's respiratory status (RR depth, effort, breath sounds)     Problem: Compromised Hemodynamic Status  Goal: Vital signs and fluid balance maintained/improved  Flowsheets (Taken 06/18/2022 0424)  Vital signs and fluid balance are maintained/improved:   Monitor/assess lab values and report abnormal values   Monitor intake and output. Notify LIP if urine output is less than 30 mL/hour.   Monitor/assess vitals and  hemodynamic parameters with position changes   Position patient for maximum circulation/cardiac output   Monitor and compare daily weight
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
## 2022-06-18 NOTE — Plan of Care (Signed)
Problem: Pain interferes with ability to perform ADL  Goal: Pain at adequate level as identified by patient  Outcome: Progressing  Flowsheets (Taken 06/18/2022 0424 by Ellwood Handler, RN)  Pain at adequate level as identified by patient:   Identify patient comfort function goal   Assess for risk of opioid induced respiratory depression, including snoring/sleep apnea. Alert healthcare team of risk factors identified.   Reassess pain within 30-60 minutes of any procedure/intervention, per Pain Assessment, Intervention, Reassessment (AIR) Cycle   Assess pain on admission, during daily assessment and/or before any "as needed" intervention(s)   Evaluate if patient comfort function goal is met   Evaluate patient's satisfaction with pain management progress   Consult/collaborate with Pain Service   Offer non-pharmacological pain management interventions   Consult/collaborate with Physical Therapy, Occupational Therapy, and/or Speech Therapy   Include patient/patient care companion in decisions related to pain management as needed     Problem: Impaired Mobility  Goal: Mobility/Activity is maintained at optimal level for patient  Outcome: Progressing  Flowsheets (Taken 06/15/2022 2007 by Carylon Perches, Astha, RN)  Mobility/activity is maintained at optimal level for patient:   Increase mobility as tolerated/progressive mobility   Perform active/passive ROM   Reposition patient every 2 hours and as needed unless able to reposition self   Assess for changes in respiratory status, level of consciousness and/or development of fatigue   Consult/collaborate with Physical Therapy and/or Occupational Therapy   Plan activities to conserve energy, plan rest periods   Encourage independent activity per ability   Maintain proper body alignment     Problem: Compromised Tissue integrity  Goal: Damaged tissue is healing and protected  Outcome: Progressing  Flowsheets (Taken 06/14/2022 0022 by Oneal Deputy, RN)  Damaged tissue  is healing and protected:   Monitor/assess Braden scale every shift   Provide wound care per wound care algorithm   Reposition patient every 2 hours and as needed unless able to reposition self   Increase activity as tolerated/progressive mobility   Relieve pressure to bony prominences for patients at moderate and high risk   Avoid shearing injuries   Use bath wipes, not soap and water, for daily bathing   Keep intact skin clean and dry   Use incontinence wipes for cleaning urine, stool and caustic drainage. Foley care as needed   Monitor external devices/tubes for correct placement to prevent pressure, friction and shearing   Encourage use of lotion/moisturizer on skin   Monitor patient's hygiene practices   Utilize specialty bed   Consult/collaborate with wound care nurse   Consider placing an indwelling catheter if incontinence interferes with healing of stage 3 or 4 pressure injury
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
## 2022-06-19 ENCOUNTER — Other Ambulatory Visit (INDEPENDENT_AMBULATORY_CARE_PROVIDER_SITE_OTHER): Payer: Self-pay | Admitting: Orthopaedic Trauma

## 2022-06-19 DIAGNOSIS — S52571A Other intraarticular fracture of lower end of right radius, initial encounter for closed fracture: Secondary | ICD-10-CM

## 2022-06-19 MED ORDER — HYDROMORPHONE HCL 1 MG/ML IJ SOLN
0.3000 mg | INTRAMUSCULAR | Status: DC | PRN
Start: 2022-06-19 — End: 2022-06-27
  Administered 2022-06-19 – 2022-06-27 (×17): 0.3 mg via INTRAVENOUS
  Filled 2022-06-19 (×18): qty 1

## 2022-06-19 NOTE — Progress Notes (Signed)
Incidental finding: CT Abdomen/Pelvis indicates a nodular density within the ventral right upper peritoneum measures 1.5 x 1.3 cm, (5:62), unchanged over the brief interval from previous imaging conducted prior to your hospitalization.    Letter completed.     MD/NP to discuss results/recommendations with patient and provide with letter/results.      Jadene Pierini, RN, BSN  Trauma Acute Care Surgery Patient Navigator  [phone]

## 2022-06-19 NOTE — Plan of Care (Signed)
NURSING SHIFT NOTE     Patient: Laura Mclaughlin  Day: 10      SHIFT EVENTS     Shift Narrative/Significant Events (PRN med administration, fall, RRT, etc.):     Patient A&OX4. Dressings to OR sites clean, dry and intact. Pain is being managed with PRN Oxycodone with good effect. Medications administered per MAR. Neuro assessments completed Q4. CTO brace in place. Pin care completed as documented. Upon shift report patient without IV access. Staff reportedly unable to obtain access despite multiple attempts. No staff available for US guided IV. Inquired about midline, Per trauma team ok for no access. Patient assisted with meals and repositioning Q2. Vital signs stable. Husband at bedside. Safety and fall precautions remain in place. Purposeful rounding completed.          ASSESSMENT     Changes in assessment from patient's baseline this shift:    Neuro: No  CV: No  Pulm: No  Peripheral Vascular: No  HEENT: No  GI: No  BM during shift: No, Last BM: Last BM Date: 06/19/22  GU: No   Integ: No  MS: No    Pain: No change  Pain Interventions: Medications, Rest, and Positioning  Medications Utilized: Oxycodone    Mobility: PMP Activity: Step 3 - Bed Mobility of             Lines     Patient Lines/Drains/Airways Status       Active Lines, Drains and Airways       Name Placement date Placement time Site Days    External Urinary Catheter 06/14/22  1754  --  4                         VITAL SIGNS     Vitals:    06/19/22 1648   BP: 149/84   Pulse: 92   Resp: 16   Temp: 98.1 F (36.7 C)   SpO2: 94%       Temp  Min: 97.9 F (36.6 C)  Max: 98.3 F (36.8 C)  Pulse  Min: 87  Max: 96  Resp  Min: 16  Max: 21  BP  Min: 115/76  Max: 149/84  SpO2  Min: 94 %  Max: 99 %      Intake/Output Summary (Last 24 hours) at 06/19/2022 1740  Last data filed at 06/19/2022 1305  Gross per 24 hour   Intake 100 ml   Output 2075 ml   Net -1975 ml              Patient using incentive spirometer?: Yes   Amount achieved on incentive spirometer?: Incentive  Spirometry Achieved (mL)  Min: 1500 mL  Max: 1500 mL      CARE PLAN        Problem: Moderate/High Fall Risk Score >5  Goal: Patient will remain free of falls  Outcome: Progressing  Flowsheets (Taken 06/19/2022 0825)  Moderate Risk (6-13):   MOD-Remain with patient during toileting   MOD-include family in multidisciplinary POC discussions     Problem: Pain interferes with ability to perform ADL  Goal: Pain at adequate level as identified by patient  Outcome: Progressing  Flowsheets (Taken 06/19/2022 1408)  Pain at adequate level as identified by patient:   Identify patient comfort function goal   Assess for risk of opioid induced respiratory depression, including snoring/sleep apnea. Alert healthcare team of risk factors identified.   Assess pain on admission, during daily  assessment and/or before any "as needed" intervention(s)   Reassess pain within 30-60 minutes of any procedure/intervention, per Pain Assessment, Intervention, Reassessment (AIR) Cycle   Evaluate if patient comfort function goal is met   Evaluate patient's satisfaction with pain management progress   Offer non-pharmacological pain management interventions   Include patient/patient care companion in decisions related to pain management as needed   Consult/collaborate with Physical Therapy, Occupational Therapy, and/or Speech Therapy     Problem: Side Effects from Pain Analgesia  Goal: Patient will experience minimal side effects of analgesic therapy  Outcome: Progressing  Flowsheets (Taken 06/19/2022 1408)  Patient will experience minimal side effects of analgesic therapy:   Monitor/assess patient's respiratory status (RR depth, effort, breath sounds)   Assess for changes in cognitive function   Prevent/manage side effects per LIP orders (i.e. nausea, vomiting, pruritus, constipation, urinary retention, etc.)   Evaluate for opioid-induced sedation with appropriate assessment tool (i.e. POSS)     Problem: Compromised Hemodynamic Status  Goal: Vital  signs and fluid balance maintained/improved  Outcome: Progressing  Flowsheets (Taken 06/19/2022 1408)  Vital signs and fluid balance are maintained/improved:   Position patient for maximum circulation/cardiac output   Monitor/assess vitals and hemodynamic parameters with position changes   Monitor/assess lab values and report abnormal values     Problem: Impaired Mobility  Goal: Mobility/Activity is maintained at optimal level for patient  Outcome: Progressing  Flowsheets (Taken 06/19/2022 1408)  Mobility/activity is maintained at optimal level for patient:   Increase mobility as tolerated/progressive mobility   Maintain proper body alignment   Perform active/passive ROM   Plan activities to conserve energy, plan rest periods   Reposition patient every 2 hours and as needed unless able to reposition self   Consult/collaborate with Physical Therapy and/or Occupational Therapy     Problem: Peripheral Neurovascular Impairment  Goal: Extremity color, movement, sensation are maintained or improved  Outcome: Progressing  Flowsheets (Taken 06/19/2022 1408)  Extremity color, movement, sensation are maintained or improved:   Increase mobility as tolerated/progressive mobility   Assess and monitor application of corrective devices (cast, brace, splint), check skin integrity   Assess extremity for proper alignment     Problem: Compromised skin integrity  Goal: Skin integrity is maintained or improved  Outcome: Progressing  Flowsheets (Taken 06/19/2022 1408)  Skin integrity is maintained or improved:   Assess Braden Scale every shift   Turn or reposition patient every 2 hours or as needed unless able to reposition self   Increase activity as tolerated/progressive mobility   Relieve pressure to bony prominences   Avoid shearing   Keep skin clean and dry   Monitor patient's hygiene practices     Problem: Nutrition  Goal: Nutritional intake is adequate  Outcome: Progressing  Flowsheets (Taken 06/19/2022 1408)  Nutritional intake is  adequate:   Assist patient with meals/food selection   Allow adequate time for meals   Encourage/perform oral hygiene as appropriate     Problem: Compromised Tissue integrity  Goal: Damaged tissue is healing and protected  Outcome: Progressing  Flowsheets (Taken 06/19/2022 1408)  Damaged tissue is healing and protected:   Monitor/assess Braden scale every shift   Reposition patient every 2 hours and as needed unless able to reposition self   Provide wound care per wound care algorithm   Increase activity as tolerated/progressive mobility   Avoid shearing injuries   Keep intact skin clean and dry  Goal: Nutritional status is improving  Outcome: Progressing  Flowsheets (Taken 06/19/2022 1408)  Nutritional status is improving:   Allow adequate time for meals   Assist patient with eating   Include patient/patient care companion in decisions related to nutrition

## 2022-06-19 NOTE — Plan of Care (Addendum)
NURSE NOTE SUMMARY  [facility] - [facility] ORIG BLDG 3   Patient Name: Laura Mclaughlin, Laura Mclaughlin   Attending Physician: Lacinda Axon, Serina Cowper*   Today's date:   06/19/2022 LOS: 10 days   Shift Summary:                                                              Received pt to room #330. Pt. A/Ox4. VSS, 2LNC. Dressing to surgical sites CDI. EX fix in the R. L. Arm. CTO brace in place. Plan of care discussed with pt. Pt denies chest pain or SOB. Medicated per MAR. Q4h neuro assessment completed.   Pt has no IV access when she came to the unit. Attempted to start one but unable.   Four eyes skin assessment completed with RN, Melissa. Fall/cerival spine /skin precautions in place.         Provider Notifications:        Rapid Response Notifications:  Mobility:      PMP Activity: Step 3 - Bed Mobility (06/18/2022  8:00 PM)     Weight tracking:  Family Dynamic:   No data found.          Last Bowel Movement   Last BM Date: 06/18/22        Problem: Moderate/High Fall Risk Score >5  Goal: Patient will remain free of falls  Flowsheets  Taken 06/18/2022 2000 by Ellwood Handler, RN  High (Greater than 13):   HIGH-Visual cue at entrance to patient's room   HIGH-Bed alarm on at all times while patient in bed   HIGH-Utilize chair pad alarm for patient while in the chair   HIGH-Apply yellow "Fall Risk" arm band   HIGH-Pharmacy to initiate evaluation and intervention per protocol   HIGH-Initiate use of floor mats as appropriate   HIGH-Consider use of low bed  Taken 06/17/2022 2000 by Ellwood Handler, RN  Moderate Risk (6-13):   MOD-Consider activation of bed alarm if appropriate   MOD-Apply bed exit alarm if patient is confused   MOD-Floor mat at bedside (where available) if appropriate   MOD-Consider a move closer to Nurses Station   MOD-Remain with patient during toileting   MOD-Place bedside commode and assistive devices out of sight when not in use   MOD-Re-orient confused patients   MOD-Utilize diversion activities    MOD-Perform dangle, stand, walk (DSW) prior to mobilization   MOD-Request PT/OT consult order for patients with gait/mobility impairment   MOD-Use gait belt when appropriate   MOD-include family in multidisciplinary POC discussions   MOD- Consider video monitoring     Problem: Pain interferes with ability to perform ADL  Goal: Pain at adequate level as identified by patient  Flowsheets (Taken 06/18/2022 0424 by Ellwood Handler, RN)  Pain at adequate level as identified by patient:   Identify patient comfort function goal   Assess for risk of opioid induced respiratory depression, including snoring/sleep apnea. Alert healthcare team of risk factors identified.   Reassess pain within 30-60 minutes of any procedure/intervention, per Pain Assessment, Intervention, Reassessment (AIR) Cycle   Assess pain on admission, during daily assessment and/or before any "as needed" intervention(s)   Evaluate if patient comfort function goal is met   Evaluate patient's satisfaction with pain management progress   Consult/collaborate with  Pain Service   Offer non-pharmacological pain management interventions   Consult/collaborate with Physical Therapy, Occupational Therapy, and/or Speech Therapy   Include patient/patient care companion in decisions related to pain management as needed     Problem: Side Effects from Pain Analgesia  Goal: Patient will experience minimal side effects of analgesic therapy  Flowsheets (Taken 06/18/2022 0424 by Ellwood Handler, RN)  Patient will experience minimal side effects of analgesic therapy:   Evaluate for opioid-induced sedation with appropriate assessment tool (i.e. POSS)   Prevent/manage side effects per LIP orders (i.e. nausea, vomiting, pruritus, constipation, urinary retention, etc.)   Assess for changes in cognitive function   Monitor/assess patient's respiratory status (RR depth, effort, breath sounds)     Problem: Compromised Hemodynamic Status  Goal: Vital signs and fluid balance  maintained/improved  Flowsheets (Taken 06/18/2022 0424 by Ellwood Handler, RN)  Vital signs and fluid balance are maintained/improved:   Monitor/assess lab values and report abnormal values   Monitor intake and output. Notify LIP if urine output is less than 30 mL/hour.   Monitor/assess vitals and hemodynamic parameters with position changes   Position patient for maximum circulation/cardiac output   Monitor and compare daily weight     Problem: Impaired Mobility  Goal: Mobility/Activity is maintained at optimal level for patient  Flowsheets (Taken 06/15/2022 2007 by Carylon Perches, Astha, RN)  Mobility/activity is maintained at optimal level for patient:   Increase mobility as tolerated/progressive mobility   Perform active/passive ROM   Reposition patient every 2 hours and as needed unless able to reposition self   Assess for changes in respiratory status, level of consciousness and/or development of fatigue   Consult/collaborate with Physical Therapy and/or Occupational Therapy   Plan activities to conserve energy, plan rest periods   Encourage independent activity per ability   Maintain proper body alignment     Problem: Peripheral Neurovascular Impairment  Goal: Extremity color, movement, sensation are maintained or improved  Flowsheets (Taken 06/10/2022 2249 by Lodema Hong, RN)  Extremity color, movement, sensation are maintained or improved:   Increase mobility as tolerated/progressive mobility   Assess and monitor application of corrective devices (cast, brace, splint), check skin integrity   Teach/review/reinforce ankle pump exercises   Assess extremity for proper alignment   VTE Prevention: Administer anticoagulant(s) and/or apply anti-embolism stockings/devices as ordered     Problem: Compromised skin integrity  Goal: Skin integrity is maintained or improved  Flowsheets (Taken 06/10/2022 2249 by Lodema Hong, RN)  Skin integrity is maintained or improved:   Assess Braden Scale every shift   Turn or  reposition patient every 2 hours or as needed unless able to reposition self   Increase activity as tolerated/progressive mobility   Relieve pressure to bony prominences   Avoid shearing   Keep skin clean and dry   Encourage use of lotion/moisturizer on skin   Monitor patient's hygiene practices   Keep head of bed 30 degrees or less (unless contraindicated)     Problem: Nutrition  Goal: Nutritional intake is adequate  Flowsheets (Taken 06/10/2022 2249 by Lodema Hong, RN)  Nutritional intake is adequate:   Monitor daily weights   Encourage/perform oral hygiene as appropriate   Allow adequate time for meals     Problem: Compromised Tissue integrity  Goal: Damaged tissue is healing and protected  Flowsheets (Taken 06/14/2022 0022 by Oneal Deputy, RN)  Damaged tissue is healing and protected:   Monitor/assess Braden scale every shift   Provide wound care per wound care algorithm  Reposition patient every 2 hours and as needed unless able to reposition self   Increase activity as tolerated/progressive mobility   Relieve pressure to bony prominences for patients at moderate and high risk   Avoid shearing injuries   Use bath wipes, not soap and water, for daily bathing   Keep intact skin clean and dry   Use incontinence wipes for cleaning urine, stool and caustic drainage. Foley care as needed   Monitor external devices/tubes for correct placement to prevent pressure, friction and shearing   Encourage use of lotion/moisturizer on skin   Monitor patient's hygiene practices   Utilize specialty bed   Consult/collaborate with wound care nurse   Consider placing an indwelling catheter if incontinence interferes with healing of stage 3 or 4 pressure injury  Goal: Nutritional status is improving  Flowsheets (Taken 06/10/2022 2249 by Lodema Hong, RN)  Nutritional status is improving:   Allow adequate time for meals   Encourage patient to take dietary supplement(s) as ordered   Include patient/patient care companion  in decisions related to nutrition   Collaborate with Clinical Nutritionist

## 2022-06-19 NOTE — OT Progress Note (Signed)
Occupational Therapy Treatment [name]        Post Acute Care Therapy Recommendations:     Discharge Recommendations:  Acute Rehab      If Acute Rehab  recommended discharge disposition is not available, patient will need up to max assist for ADLs/functional mobility and HHOT.     DME needs IF patient is discharging home: Wheelchair-manual (drop arm BSC, transport into home)    Therapy discharge recommendations may change with patient status.  Please refer to most recent note for up-to-date recommendations.    Patient anticipated to benefit from and to be able to engage in 3 hours of therapy a day for 5 days a week.     Assessment:   Significant Findings: none     Patient agreeable to therapy this session, received with husband in room. Session focusing on positioning for pain management and therex. Improvements in initiating BLE movement this session in prep for functional transfers and LB ADLs, continues with swelling and requiring Max A overall. Good control of UB to bring trunk forward for bed mobility, Max A for peri hygiene. Patient will continue to benefit from acute OT services.        Patient left without needs and call bell within reach. RN notified of session outcome.       Treatment Activities: ADL retraining, Functional transfer training, and UE strengthening/ROM    Educated the patient to role of occupational therapy, plan of care, goals of therapy and HEP, safety with mobility and ADLs.    Plan:   OT Frequency Recommended: 4-5x/wk     Goal Formulation: Patient  OT Plan  Risks/Benefits/POC Discussed with Pt/Family: With patient  Treatment Interventions: ADL retraining, Functional transfer training, UE strengthening/ROM, Endurance training, Patient/Family training  Discharge Recommendation: Acute Rehab  DME Recommended for Discharge: Wheelchair-manual (drop arm BSC, transport into home)  OT Frequency Recommended: 4-5x/wk  OT - Next Visit Recommendation: 06/20/22    Continue plan of  care.    Unit: [facility] ORIG BLDG 3  Bed: O330/O330.01         Precautions and Contraindications:   Precautions  Weight Bearing Status: RUE platform weight bearing, RLE non weight bearing, LLE non weight bearing  Back Brace Applied: yes, at all times  Spinal Precautions: no bending, no twisting, no lifting  Other Precautions: Moderate Fall Risk    Updated Medical Status/Imaging/Labs:  Reviewed     Subjective: "That was very helpful, thank you" RE: education on positioning in bed   Patient's medical condition is appropriate for Occupational Therapy intervention at this time.  Patient is agreeable to participation in the therapy session.    Pain Assessment  Pain Assessment: Wong-Baker FACES  Wong-Baker FACES Pain Rating: Hurts little more (BLE, repositioned)   .           Objective:   Observation of Patient/Vital Signs:  Patient is in bed with CTO in place.  Pt wore mask during therapy session:No      Cognition/Neuro Status  Arousal/Alertness: Appropriate responses to stimuli  Attention Span: Appears intact  Orientation Level: Oriented X4  Memory: Appears intact  Following Commands: Follows all commands and directions without difficulty  Safety Awareness: independent  Insights: Educated in Engineer, building services  Behavior: attentive;calm;cooperative    Functional Mobility  Rolling: Maximal Assist  Scooting Transfers: Maximal Assist (supine boosting)    Self-care and Home Management  UB Dressing: Minimal Assist  LB Dressing: Maximal Assist  Toileting: Maximal Assist;perineal hygiene  Functional Transfers: Maximal Assist    Therapeutic Exercises  Shoulder AROM: Flexion;Extension  Elbow AROM: Flexion;Extension                   PMP Activity: Step 3 - Bed Mobility               Participation: good  Endurance: good    Patient left with call bell within reach, all needs met, SCDs off, fall mat on, bed alarm on, chair alarm off and all questions answered. RN notified of session outcome and patient response.      Goals:    Time For Goal Achievement: 5 visits  ADL Goals  Patient will groom self: Contact Guard Assist  Patient will dress upper body: Minimal Assist  Patient will dress lower body: Minimal Assist, with AE  Patient will toilet: Moderate Assist  Mobility and Transfer Goals  Pt will perform functional transfers: Moderate Assist                         PPE worn during session: gloves    Tech present: No   PPE worn by tech: N/A    Time of Treatment  OT Received On: 06/19/22  Start Time: 1420  Stop Time: 1500  Time Calculation (min): 40 min  Treatment # 3 of 5 visits    Juanetta Beets, OTR/L  Pager [phone]
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
## 2022-06-20 MED ORDER — OXYCODONE HCL 5 MG PO TABS
5.0000 mg | ORAL_TABLET | Freq: Four times a day (QID) | ORAL | 0 refills | Status: DC | PRN
Start: 2022-06-20 — End: 2022-06-28

## 2022-06-20 MED ORDER — METHOCARBAMOL 500 MG PO TABS
500.0000 mg | ORAL_TABLET | Freq: Four times a day (QID) | ORAL | 0 refills | Status: DC
Start: 2022-06-20 — End: 2022-06-28

## 2022-06-20 MED ORDER — BISACODYL 10 MG RE SUPP
10.0000 mg | Freq: Every day | RECTAL | Status: DC | PRN
Start: 2022-06-20 — End: 2022-11-27

## 2022-06-20 MED ORDER — GABAPENTIN 100 MG PO CAPS
200.0000 mg | ORAL_CAPSULE | Freq: Three times a day (TID) | ORAL | 0 refills | Status: DC
Start: 2022-06-20 — End: 2022-06-28

## 2022-06-20 MED ORDER — SENNOSIDES-DOCUSATE SODIUM 8.6-50 MG PO TABS
1.0000 | ORAL_TABLET | Freq: Two times a day (BID) | ORAL | Status: DC
Start: 2022-06-20 — End: 2022-11-27

## 2022-06-20 MED ORDER — ACETAMINOPHEN 325 MG PO TABS
650.0000 mg | ORAL_TABLET | Freq: Four times a day (QID) | ORAL | Status: DC
Start: 2022-06-20 — End: 2022-06-28

## 2022-06-20 MED ORDER — LAMOTRIGINE 150 MG PO TABS
300.0000 mg | ORAL_TABLET | Freq: Every day | ORAL | Status: DC
Start: 2022-06-21 — End: 2023-01-22

## 2022-06-20 MED ORDER — TOPIRAMATE 50 MG PO TABS
150.0000 mg | ORAL_TABLET | Freq: Two times a day (BID) | ORAL | Status: DC
Start: 2022-06-20 — End: 2023-01-22

## 2022-06-20 MED ORDER — ENOXAPARIN SODIUM 60 MG/0.6ML IJ SOSY
50.0000 mg | PREFILLED_SYRINGE | Freq: Two times a day (BID) | INTRAMUSCULAR | 1 refills | Status: AC
Start: 2022-06-20 — End: 2022-07-24

## 2022-06-20 MED ORDER — ROTIGOTINE 3 MG/24HR TD PT24
3.0000 mg | MEDICATED_PATCH | TRANSDERMAL | Status: AC
Start: 2022-06-21 — End: ?

## 2022-06-20 MED ORDER — POLYETHYLENE GLYCOL 3350 17 G PO PACK
17.0000 g | PACK | Freq: Every day | ORAL | Status: DC
Start: 2022-06-21 — End: 2022-08-16

## 2022-06-20 MED ORDER — GABAPENTIN 300 MG PO CAPS
300.0000 mg | ORAL_CAPSULE | Freq: Three times a day (TID) | ORAL | Status: DC
Start: 2022-06-20 — End: 2022-06-24
  Administered 2022-06-20 – 2022-06-24 (×11): 300 mg via ORAL
  Filled 2022-06-20 (×11): qty 1

## 2022-06-20 NOTE — PT Progress Note (Signed)
Physical Therapy Note    Physical Therapy Treatment  Sherryle Lisindy Lou Rozar  Post Acute Care Therapy Recommendations:     Discharge Recommendations:  Acute Rehab    If Acute Rehab  recommended discharge disposition is not available, patient will need hands on max assist for all functional mobility/ADLs, below listed equipment and HHPT with transport into home.     [facility]    Therapy discharge recommendations may change with patient status.  Please refer to most recent note for up-to-date recommendations.      Assessment:   Significant Findings: none    Pt received in bed, pleasant and very motivated to participate.  She is educated on spine precautions and weight bearing restrictions.  Bed positioned with HOB elevated to max height.  Pt able to pivot to sitting EOB on L side with mod a.  She demonstrates good tolerance and sitting balance EOB with SBA/cga for trunk stabilization.  She is able to complete seated therex for UE/LE and trunk stability.  Sit>supine with max a x 2.  Mod/max a x 2 for rolling.  She is an excellent rehab candidate and  will continue to benefit from PT to maximize functional independence and safety with mobility.         Treatment Activities: bed mob, sitting balance training, therex, upright tolerance training.     Educated the patient to role of physical therapy, plan of care, goals of therapy and safety with mobility and ADLs, spine precautions, weight bearing precautions.    Plan:   PT Frequency: 4-5x/wk    Continue plan of care.    [facility]  Bed: O330/O330.01     Precautions and Contraindications:   Falls, NWB bil LE, platform WB R UE, CTO AAT.      Updated Medical Status/Imaging/Labs:   Recent Labs   Lab 06/16/22  0126   WBC 11.12*   Hgb 7.6*   Hematocrit 23.9*   Platelets 402*       Subjective:    "I want out of this situation."  Pt is a very motivated participant.   Patient's  medical condition is appropriate for Physical Therapy intervention at this time.  Patient is agreeable to participation in the therapy session. Nursing clears patient for therapy.    Pain:   Scale: 5/10  Location: R knee with radiation to lower leg, neck "stiffness", "some" pain L thigh.   Intervention: MAR, repositioned.       Objective:   Patient is in bed with CTO in place. Wedges for positioning, bolsters under knees.  Pt wore mask during therapy session:No      Cognition  Calm, cooperative, motivated    Functional Mobility  Rolling: mod/max a x 2  Supine to Sit: mod with HOB fully elevated  Scooting: mod a  Sit to Stand: n/t  Stand to Sit: n/t  Transfers: n/t    Ambulation  PMP - Progressive Mobility Protocol   PMP Activity: Step 3 - Bed Mobility  Distance Walked (ft) (Step 6,7): 0 Feet   Level of Assistance required: unable  Wheelchair Management: n/t    Balance  Static Sitting: good-  Dynamic Sitting: fair+  Static Standing: n/t  Dynamic Standing: n/t    Therapeutic Exercises  Seated LAQ, AP, hip add/add, UE forward flex to 80 deg  Trunk stabilization with UE and LE mobility  Supine: SAQ, AP    Patient Participation:  good  Patient Endurance: good    Patient left with call bell within reach, all needs met, SCDs not placed as per condition on arrival, fall mat in place, bed alarm activated, chair alarm n/a and all questions answered. RN notified of session outcome and patient response.     Goals:  Goals  Goal Formulation: With patient/family  Time for Goal Acheivement: 7 visits  Goals: Select goal  Pt Will Go Supine To Sit: with minimal assist, to maximize functional mobility and independence, 7 visits  Pt Will Perform Sit To Supine: with minimal assist, to maximize functional mobility and independence, 7 visits  Pt Will Transfer Bed/Chair: with maximal assist, to maximize functional mobility and independence, 7 visits  Pt Will Propel Wheelchair: 10-50 feet, with moderate assist, to maximize functional mobility  and independence, 10 visits      PPE worn during session: gloves    Tech present: none  PPE worn by tech: N/A    Riccardo Dubin, MSPT, pager # [phone], 06/20/2022, 10:40 AM      Time of Treatment:  PT Received On: 06/20/22  Start Time: 0930  Stop Time: 1017  Time Calculation (min): 47 min  Treatment # 3 out of 7 visits

## 2022-06-20 NOTE — Progress Notes (Signed)
Discharge  SNF    Update  Only one facility in [facility] willing to accept.   [facility] [phone]    Cm will continue to follow.      Doyel Mulkern Johnson-Addo, MSW  SW Case Manager  Case Manager Department  [phone]

## 2022-06-20 NOTE — Plan of Care (Signed)
Problem: Moderate/High Fall Risk Score >5  Goal: Patient will remain free of falls  Outcome: Progressing  Flowsheets  Taken 06/19/2022 2000 by Alford HighlandMcLean, Sahmir Weatherbee, RN  Moderate Risk (6-13):   MOD-Consider activation of bed alarm if appropriate   MOD-Apply bed exit alarm if patient is confused  Taken 06/18/2022 2000 by Ellwood Handleraylor, Grant, RN  High (Greater than 13):   HIGH-Visual cue at entrance to patient's room   HIGH-Bed alarm on at all times while patient in bed   HIGH-Utilize chair pad alarm for patient while in the chair   HIGH-Apply yellow "Fall Risk" arm band   HIGH-Pharmacy to initiate evaluation and intervention per protocol   HIGH-Initiate use of floor mats as appropriate   HIGH-Consider use of low bed     Problem: Pain interferes with ability to perform ADL  Goal: Pain at adequate level as identified by patient  Outcome: Progressing  Flowsheets (Taken 06/19/2022 1408 by Milingis, Crystal, RN)  Pain at adequate level as identified by patient:   Identify patient comfort function goal   Assess for risk of opioid induced respiratory depression, including snoring/sleep apnea. Alert healthcare team of risk factors identified.   Assess pain on admission, during daily assessment and/or before any "as needed" intervention(s)   Reassess pain within 30-60 minutes of any procedure/intervention, per Pain Assessment, Intervention, Reassessment (AIR) Cycle   Evaluate if patient comfort function goal is met   Evaluate patient's satisfaction with pain management progress   Offer non-pharmacological pain management interventions   Include patient/patient care companion in decisions related to pain management as needed   Consult/collaborate with Physical Therapy, Occupational Therapy, and/or Speech Therapy     Problem: Compromised Hemodynamic Status  Goal: Vital signs and fluid balance maintained/improved  Outcome: Progressing  Flowsheets (Taken 06/19/2022 1408 by Milingis, Crystal, RN)  Vital signs and fluid balance are  maintained/improved:   Position patient for maximum circulation/cardiac output   Monitor/assess vitals and hemodynamic parameters with position changes   Monitor/assess lab values and report abnormal values     Problem: Peripheral Neurovascular Impairment  Goal: Extremity color, movement, sensation are maintained or improved  Outcome: Progressing  Flowsheets (Taken 06/19/2022 1408 by Milingis, Crystal, RN)  Extremity color, movement, sensation are maintained or improved:   Increase mobility as tolerated/progressive mobility   Assess and monitor application of corrective devices (cast, brace, splint), check skin integrity   Assess extremity for proper alignment        NURSE NOTE SUMMARY  [facility]   Patient Name: Laura Mclaughlin,Laura Mclaughlin   Attending Physician: Lacinda AxonFranco Cadavid, Serina CowperElizabet*   Today's date:   06/20/2022 LOS: 11 days   Shift Summary:                                                              Patient continues to be in pain at her surgical and Fx sites. PRN pain medications given. PO pain meds were not managing pain. Patient crying in pain. IV placed and IV pain medication given. IV pain medication relieved the pain. VSS.      Provider Notifications:        Rapid Response Notifications:  Mobility:      PMP Activity: Step 3 - Bed Mobility (06/19/2022  8:00 PM)  Weight tracking:  Family Dynamic:   No data found.          Last Bowel Movement   Last BM Date: 06/19/22
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
## 2022-06-20 NOTE — Plan of Care (Signed)
Problem: Moderate/High Fall Risk Score >5  Goal: Patient will remain free of falls  06/20/2022 1602 by de Lindalou Hose, RN  Outcome: Progressing  Flowsheets (Taken 06/19/2022 2000 by Alford Highland, RN)  Moderate Risk (6-13):   MOD-Consider activation of bed alarm if appropriate   MOD-Apply bed exit alarm if patient is confused  06/20/2022 1120 by de Lawson Radar, Byrd Hesselbach, RN  Outcome: Progressing     Problem: Pain interferes with ability to perform ADL  Goal: Pain at adequate level as identified by patient  06/20/2022 1602 by de Lindalou Hose, RN  Outcome: Progressing  Flowsheets (Taken 06/19/2022 1408 by Milingis, Crystal, RN)  Pain at adequate level as identified by patient:   Identify patient comfort function goal   Assess for risk of opioid induced respiratory depression, including snoring/sleep apnea. Alert healthcare team of risk factors identified.   Assess pain on admission, during daily assessment and/or before any "as needed" intervention(s)   Reassess pain within 30-60 minutes of any procedure/intervention, per Pain Assessment, Intervention, Reassessment (AIR) Cycle   Evaluate if patient comfort function goal is met   Evaluate patient's satisfaction with pain management progress   Offer non-pharmacological pain management interventions   Include patient/patient care companion in decisions related to pain management as needed   Consult/collaborate with Physical Therapy, Occupational Therapy, and/or Speech Therapy  06/20/2022 1120 by de Lawson Radar, Byrd Hesselbach, RN  Outcome: Progressing     Problem: Side Effects from Pain Analgesia  Goal: Patient will experience minimal side effects of analgesic therapy  06/20/2022 1602 by Tye Savoy, Byrd Hesselbach, RN  Outcome: Progressing  Flowsheets (Taken 06/19/2022 1408 by Milingis, Crystal, RN)  Patient will experience minimal side effects of analgesic therapy:   Monitor/assess patient's respiratory status (RR depth, effort, breath sounds)   Assess for changes in cognitive function    Prevent/manage side effects per LIP orders (i.e. nausea, vomiting, pruritus, constipation, urinary retention, etc.)   Evaluate for opioid-induced sedation with appropriate assessment tool (i.e. POSS)  06/20/2022 1120 by Tye Savoy, Byrd Hesselbach, RN  Outcome: Progressing     Problem: Compromised Hemodynamic Status  Goal: Vital signs and fluid balance maintained/improved  06/20/2022 1602 by Tye Savoy, Byrd Hesselbach, RN  Outcome: Progressing  Flowsheets (Taken 06/19/2022 1408 by Milingis, Crystal, RN)  Vital signs and fluid balance are maintained/improved:   Position patient for maximum circulation/cardiac output   Monitor/assess vitals and hemodynamic parameters with position changes   Monitor/assess lab values and report abnormal values  06/20/2022 1120 by de Lawson Radar, Byrd Hesselbach, RN  Outcome: Progressing     Problem: Impaired Mobility  Goal: Mobility/Activity is maintained at optimal level for patient  06/20/2022 1602 by de Lawson Radar, Byrd Hesselbach, RN  Outcome: Progressing  Flowsheets (Taken 06/19/2022 1408 by Milingis, Crystal, RN)  Mobility/activity is maintained at optimal level for patient:   Increase mobility as tolerated/progressive mobility   Maintain proper body alignment   Perform active/passive ROM   Plan activities to conserve energy, plan rest periods   Reposition patient every 2 hours and as needed unless able to reposition self   Consult/collaborate with Physical Therapy and/or Occupational Therapy  06/20/2022 1120 by de Lawson Radar, Byrd Hesselbach, RN  Outcome: Progressing     Problem: Peripheral Neurovascular Impairment  Goal: Extremity color, movement, sensation are maintained or improved  06/20/2022 1602 by de Lindalou Hose, RN  Outcome: Progressing  Flowsheets (Taken 06/19/2022 1408 by Milingis, Crystal, RN)  Extremity color, movement, sensation are maintained or improved:  Increase mobility as tolerated/progressive mobility   Assess and monitor application of corrective devices (cast, brace, splint), check skin integrity   Assess extremity for  proper alignment  06/20/2022 1120 by de Lawson Radar, Byrd Hesselbach, RN  Outcome: Progressing     Problem: Compromised skin integrity  Goal: Skin integrity is maintained or improved  06/20/2022 1602 by de Lawson Radar, Byrd Hesselbach, RN  Outcome: Progressing  Flowsheets (Taken 06/19/2022 1408 by Milingis, Crystal, RN)  Skin integrity is maintained or improved:   Assess Braden Scale every shift   Turn or reposition patient every 2 hours or as needed unless able to reposition self   Increase activity as tolerated/progressive mobility   Relieve pressure to bony prominences   Avoid shearing   Keep skin clean and dry   Monitor patient's hygiene practices  06/20/2022 1120 by de Lawson Radar, Byrd Hesselbach, RN  Outcome: Progressing     Problem: Nutrition  Goal: Nutritional intake is adequate  06/20/2022 1602 by de Lindalou Hose, RN  Outcome: Progressing  Flowsheets (Taken 06/19/2022 1408 by Milingis, Crystal, RN)  Nutritional intake is adequate:   Assist patient with meals/food selection   Allow adequate time for meals   Encourage/perform oral hygiene as appropriate  06/20/2022 1120 by de Lawson Radar, Byrd Hesselbach, RN  Outcome: Progressing     Problem: Compromised Tissue integrity  Goal: Damaged tissue is healing and protected  06/20/2022 1602 by Tye Savoy, Byrd Hesselbach, RN  Outcome: Progressing  Flowsheets (Taken 06/19/2022 1408 by Milingis, Crystal, RN)  Damaged tissue is healing and protected:   Monitor/assess Braden scale every shift   Reposition patient every 2 hours and as needed unless able to reposition self   Provide wound care per wound care algorithm   Increase activity as tolerated/progressive mobility   Avoid shearing injuries   Keep intact skin clean and dry  06/20/2022 1120 by de Lawson Radar, Byrd Hesselbach, RN  Outcome: Progressing  Goal: Nutritional status is improving  06/20/2022 1602 by de Lindalou Hose, RN  Outcome: Progressing  Flowsheets (Taken 06/19/2022 1408 by Milingis, Crystal, RN)  Nutritional status is improving:   Allow adequate time for meals   Assist patient with  eating   Include patient/patient care companion in decisions related to nutrition  06/20/2022 1120 by de Lindalou Hose, RN  Outcome: Progressing
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
## 2022-06-21 ENCOUNTER — Encounter
Admission: AC | Disposition: A | Discharge status: Rehabilitation Facility | Payer: Self-pay | Source: Other Acute Inpatient Hospital | Attending: Surgery

## 2022-06-21 SURGERY — ORIF, RADIUS, DISTAL (WRIST)
Anesthesia: General | Site: Arm Lower | Laterality: Right

## 2022-06-21 MED ORDER — SENNOSIDES-DOCUSATE SODIUM 8.6-50 MG PO TABS
2.0000 | ORAL_TABLET | Freq: Two times a day (BID) | ORAL | Status: DC
Start: 2022-06-21 — End: 2022-06-29
  Administered 2022-06-21 – 2022-06-29 (×15): 2 via ORAL
  Filled 2022-06-21 (×17): qty 2

## 2022-06-21 MED ORDER — MAGNESIUM HYDROXIDE 400 MG/5ML PO SUSP
30.0000 mL | Freq: Every day | ORAL | Status: DC
Start: 2022-06-21 — End: 2022-06-29
  Administered 2022-06-21 – 2022-06-29 (×7): 30 mL via ORAL
  Filled 2022-06-21 (×6): qty 30
  Filled 2022-06-21: qty 90
  Filled 2022-06-21 (×4): qty 30

## 2022-06-21 NOTE — Plan of Care (Signed)
Shift note:   -Prn pain medication administered as needed for pain  Neuro: AOX4  Cardiac: No tele   Respiratory: Room air   Mobility: Bed mobility with q2 turning   GI/GU: Continent of Bowel and Bladder. Last BM-06/21/2022 External catheter in place  Pain: Right arm, bilateral lower extremities  Concerns: No complaints at this time.  Safety: Bed in lowest position, call bell within reach all safety measures in place, will continue to monitor.     Plan:   Problem: Moderate/High Fall Risk Score >5  Goal: Patient will remain free of falls  Outcome: Progressing     Problem: Pain interferes with ability to perform ADL  Goal: Pain at adequate level as identified by patient  Outcome: Progressing     Problem: Side Effects from Pain Analgesia  Goal: Patient will experience minimal side effects of analgesic therapy  Outcome: Progressing     Problem: Compromised Hemodynamic Status  Goal: Vital signs and fluid balance maintained/improved  Outcome: Progressing     Problem: Impaired Mobility  Goal: Mobility/Activity is maintained at optimal level for patient  Outcome: Progressing     Problem: Peripheral Neurovascular Impairment  Goal: Extremity color, movement, sensation are maintained or improved  Outcome: Progressing     Problem: Compromised skin integrity  Goal: Skin integrity is maintained or improved  Outcome: Progressing     Problem: Nutrition  Goal: Nutritional intake is adequate  Outcome: Progressing     Problem: Compromised Tissue integrity  Goal: Damaged tissue is healing and protected  Outcome: Progressing  Goal: Nutritional status is improving  Outcome: Progressing

## 2022-06-21 NOTE — Plan of Care (Signed)
Problem: Moderate/High Fall Risk Score >5  Goal: Patient will remain free of falls  Outcome: Progressing  Flowsheets (Taken 06/19/2022 2000)  Moderate Risk (6-13):   MOD-Consider activation of bed alarm if appropriate   MOD-Apply bed exit alarm if patient is confused     Problem: Impaired Mobility  Goal: Mobility/Activity is maintained at optimal level for patient  Outcome: Progressing  Flowsheets (Taken 06/19/2022 1408 by Milingis, Crystal, RN)  Mobility/activity is maintained at optimal level for patient:   Increase mobility as tolerated/progressive mobility   Maintain proper body alignment   Perform active/passive ROM   Plan activities to conserve energy, plan rest periods   Reposition patient every 2 hours and as needed unless able to reposition self   Consult/collaborate with Physical Therapy and/or Occupational Therapy     Problem: Peripheral Neurovascular Impairment  Goal: Extremity color, movement, sensation are maintained or improved  Outcome: Progressing  Flowsheets (Taken 06/19/2022 1408 by Milingis, Crystal, RN)  Extremity color, movement, sensation are maintained or improved:   Increase mobility as tolerated/progressive mobility   Assess and monitor application of corrective devices (cast, brace, splint), check skin integrity   Assess extremity for proper alignment     Problem: Compromised skin integrity  Goal: Skin integrity is maintained or improved  Outcome: Progressing  Flowsheets (Taken 06/19/2022 1408 by Milingis, Crystal, RN)  Skin integrity is maintained or improved:   Assess Braden Scale every shift   Turn or reposition patient every 2 hours or as needed unless able to reposition self   Increase activity as tolerated/progressive mobility   Relieve pressure to bony prominences   Avoid shearing   Keep skin clean and dry   Monitor patient's hygiene practices     Problem: Compromised Tissue integrity  Goal: Damaged tissue is healing and protected  Outcome: Progressing  Flowsheets (Taken 06/19/2022  1408 by Milingis, Crystal, RN)  Damaged tissue is healing and protected:   Monitor/assess Braden scale every shift   Reposition patient every 2 hours and as needed unless able to reposition self   Provide wound care per wound care algorithm   Increase activity as tolerated/progressive mobility   Avoid shearing injuries   Keep intact skin clean and dry        NURSE NOTE SUMMARY  [facility] - [facility] ORIG BLDG 3   Patient Name: Laura Mclaughlin   Attending Physician: Lacinda Axon, Serina Cowper*   Today's date:   06/21/2022 LOS: 12 days   Shift Summary:                                                              Patient able to assist with turning and has more movement than last night. Pain managed with PRN pain medications and ice packs. VSS. Patient resting comfortably in bed at this time.      Provider Notifications:        Rapid Response Notifications:  Mobility:      PMP Activity: Step 3 - Bed Mobility (06/20/2022  8:10 PM)     Weight tracking:  Family Dynamic:   No data found.          Last Bowel Movement   Last BM Date: 06/19/22
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
## 2022-06-22 NOTE — Plan of Care (Signed)
Problem: Moderate/High Fall Risk Score >5  Goal: Patient will remain free of falls  Outcome: Progressing     Problem: Pain interferes with ability to perform ADL  Goal: Pain at adequate level as identified by patient  Outcome: Progressing     Problem: Side Effects from Pain Analgesia  Goal: Patient will experience minimal side effects of analgesic therapy  Outcome: Progressing     Problem: Compromised Hemodynamic Status  Goal: Vital signs and fluid balance maintained/improved  Outcome: Progressing     Problem: Impaired Mobility  Goal: Mobility/Activity is maintained at optimal level for patient  Outcome: Progressing     Problem: Peripheral Neurovascular Impairment  Goal: Extremity color, movement, sensation are maintained or improved  Outcome: Progressing     Problem: Compromised skin integrity  Goal: Skin integrity is maintained or improved  Outcome: Progressing     Problem: Nutrition  Goal: Nutritional intake is adequate  Outcome: Progressing     Problem: Compromised Tissue integrity  Goal: Damaged tissue is healing and protected  Outcome: Progressing  Goal: Nutritional status is improving  Outcome: Progressing

## 2022-06-22 NOTE — PT Progress Note (Signed)
Physical Therapy Evaluation  Laura Mclaughlin    MRN#: [id number]   Post Acute Care Therapy Recommendations:     Discharge Recommendations: Acute Rehab   D/C Milestones: None  Patient anticipated to benefit from and to be able to engage in 3 hours of therapy a day for 5 days a week.      If Acute Rehab recommended discharge disposition is not available, patient will need min assist for all mobility, equipment listed below, and HHPT.     DME needs IF patient is discharging home: [facility], Hospital bed, [facility] Lift    Therapy discharge recommendations may change with patient status.  Please refer to most recent note for up-to-date recommendations.    Assessment:   Significant Findings: None    Patient presents with improved transfer bed to chair, completing scooting posterior with min-mod A x 2 people this session, but continues to show decreased functional mobility, impaired gait, decreased endurance, and decreased balance. Pt would continue to benefit from PT to address these deficits and increase functional independence.    Treatment Activities: Therapeutic exercises    Educated the patient to role of physical therapy, plan of care, goals of therapy and safety with mobility and ADLs.    Plan:   Unit: [facility] ORIG BLDG 3  Bed: O330/O330.01    PT Frequency: 4-5x/wk    Continue plan of care.       Precautions and Contraindications:   Falls  B LE NWB  R UE platform WB  C-spine precautions  CTO AAT    Updated Medical Status/Imaging/Labs:       Subjective:    "If you don't try, you won't be able to do it"  Patient's medical condition is appropriate for Physical Therapy intervention at this time. Nursing clears pt for PT. Patient is agreeable to participation in the therapy session.    Pain:   Scale: 6/10  Location: B LE  Intervention: positioned for comfort, offered rest breaks as needed    Objective:   Patient is in bed with female external catheter, R UE ex-fix, and CTO in place. Pt wore mask  during therapy session:No      Cognition  Alert and oriented throughout session   Follows commands without difficulty    Functional Mobility  Supine to Sit: CGA  Scooting: min-mod A x 2 people  Transfers: Bed to chair: min-mod A x 2 people scooting posteriorly    PMP - Progressive Mobility Protocol   PMP Activity: Step 5 - Chair     Balance  Static Sitting: Good  Dynamic Sitting: Fair+    Therapeutic Exercises  B heel slides (with limited ROM) x10 with mod A, B ankle pumps x10, B quad sets x10    Patient Participation: Good  Patient Endurance: Fair    Patient left with call bell within reach, all needs met, SCDs not in place, as when entered room, fall mat in place, and chair alarm not in place, RN aware, and all questions answered. RN notified of session outcome and patient response.     Goals:  Goals  Goal Formulation: With patient/family  Time for Goal Acheivement: 7 visits  Goals: Select goal  Pt Will Go Supine To Sit: with minimal assist, to maximize functional mobility and independence, 7 visits  Pt Will Perform Sit To Supine: with minimal assist, to maximize functional mobility and independence, 7 visits  Pt Will Transfer Bed/Chair: with maximal assist, to maximize functional mobility and independence, 7 visits  Pt Will Propel Wheelchair: 10-50 feet, with moderate assist, to maximize functional mobility and independence, 10 visits    PPE worn during session: gloves    Tech present: Pieter Partridge  PPE worn by tech: gloves      Dolphus Jenny, PT, DPT   Pager number: [phone]    Time of Treatment:  PT Received On: 06/22/22  Start Time: 1340  Stop Time: 1406  Time Calculation (min): 26 min  Treatment # 4 out of 7 visits
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
## 2022-06-22 NOTE — Plan of Care (Incomplete)
Neuro: AOX4  Cardiac:  No tele   Respiratory: Room air   Mobility: Bed mobility with q2 turning;  GI/GU: Continent of Bowel and Bladder. Last BM-06/21/22 External catheter in place  Pain:   Concerns:   Safety: Bed in lowest position, call bell within reach all safety measures in place, will continue to monitor.     Plan:   Problem: Moderate/High Fall Risk Score >5  Goal: Patient will remain free of falls  Outcome: Progressing     Problem: Pain interferes with ability to perform ADL  Goal: Pain at adequate level as identified by patient  Outcome: Progressing     Problem: Side Effects from Pain Analgesia  Goal: Patient will experience minimal side effects of analgesic therapy  Outcome: Progressing     Problem: Compromised Hemodynamic Status  Goal: Vital signs and fluid balance maintained/improved  Outcome: Progressing     Problem: Impaired Mobility  Goal: Mobility/Activity is maintained at optimal level for patient  Outcome: Progressing     Problem: Peripheral Neurovascular Impairment  Goal: Extremity color, movement, sensation are maintained or improved  Outcome: Progressing     Problem: Compromised skin integrity  Goal: Skin integrity is maintained or improved  Outcome: Progressing     Problem: Nutrition  Goal: Nutritional intake is adequate  Outcome: Progressing     Problem: Compromised Tissue integrity  Goal: Damaged tissue is healing and protected  Outcome: Progressing  Goal: Nutritional status is improving  Outcome: Progressing

## 2022-06-23 NOTE — Anesthesia Postprocedure Evaluation (Signed)
Anesthesia Post Evaluation    Patient: Laura Mclaughlin    Procedure(s):  ORIF, TIBIA PLATEAU RIGHT  APPLICATION, EXTERNAL FIXATOR, UPPER EXTREMITY    Anesthesia type: general    Last Vitals:   Vitals Value Taken Time   BP 137/74 06/14/22 1250   Temp 36.2 C (97.1 F) 06/14/22 1250   Pulse 87 06/14/22 1250   Resp 16 06/14/22 1250   SpO2 97 % 06/14/22 1250                 Anesthesia Post Evaluation:     Patient Evaluated: PACU    Level of Consciousness: awake and alert    Pain Management: adequate    Airway Patency: patent    Anesthetic complications: No      PONV Status: none    Cardiovascular status: acceptable  Respiratory status: acceptable  Hydration status: acceptable        Signed by: Hessie Diener, MD, 06/23/2022 5:24 PM

## 2022-06-23 NOTE — Plan of Care (Signed)
Problem: Moderate/High Fall Risk Score >5  Goal: Patient will remain free of falls  Outcome: Progressing     Problem: Pain interferes with ability to perform ADL  Goal: Pain at adequate level as identified by patient  Outcome: Progressing     Problem: Side Effects from Pain Analgesia  Goal: Patient will experience minimal side effects of analgesic therapy  Outcome: Progressing     Problem: Compromised Hemodynamic Status  Goal: Vital signs and fluid balance maintained/improved  Outcome: Progressing     Problem: Impaired Mobility  Goal: Mobility/Activity is maintained at optimal level for patient  Outcome: Progressing     Problem: Peripheral Neurovascular Impairment  Goal: Extremity color, movement, sensation are maintained or improved  Outcome: Progressing     Problem: Compromised skin integrity  Goal: Skin integrity is maintained or improved  Outcome: Progressing     Problem: Nutrition  Goal: Nutritional intake is adequate  Outcome: Progressing     Problem: Compromised Tissue integrity  Goal: Damaged tissue is healing and protected  Outcome: Progressing  Goal: Nutritional status is improving  Outcome: Progressing

## 2022-06-23 NOTE — Anesthesia Procedure Notes (Signed)
Peripheral Nerve Block    Patient location during procedure: OR  Reason for block: Post-op pain management      Injection technique: Single Shot  Block Region: Femoral  Laterality: Right      Block at surgeon's request Yes        Staffing  Anesthesiologist: Hessie Diener, MD  Performed: Anesthesiologist       Pre-procedure Checklist   Completed: patient identified, surgical consent, pre-op evaluation, timeout performed, risks and benefits discussed, anesthesia consent given and correct site        Peripheral Block  Patient monitoring: NIBP, Pulse oximetry, EKG and Nasal cannula O2  Premedication: Yes and Meaningful contact maintained  Local infiltration: Lidocaine 1%      Needle  Needle type: Tuohy   Needle gauge: 18 G  Needle length: 10 cm  Catheter size: 20 G      Guidance: ultrasound guided  Ultrasound Guided: LA spread visualized, Image stored or printed, Needle visualized, Relevant anatomy identified (nerve, vessels, muscle), Air contrast visualized and Catheter visualized          Assessment   Incremental injection: yes  Injection made incrementally with aspirations every 5 mL.  Injection Resistance: no  Paresthesia Pain: no    Blood Aspirated: No  no suspected intravascular injection  Patient tolerated procedure well: Yes  Block Outcome: No complications and Successful block
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
## 2022-06-23 NOTE — Plan of Care (Signed)
VSS on RA, Surgical sites looks clean/intact/dry, denies any numbness/tingling sensation but complained of some pain to the surgical site, prn pain meds given with some relief. WCTM  Neuro: A/Ox4  HEENT: WNL/glasses  Cardiac: (-)  Respiratory: Clear to auscultation bilat.  [id number] ordered diet, - N/V, LBM 8/17  IV Access: LUE SL  GU: Voiding freely  Musculoskeletal: Bed mobility x2,   Plan of Care:   Pain managed w/prn meds  Safety/fall precautions in place. Pt instructed to use call bell for assistance. Purposeful rounding completed.       Problem: Moderate/High Fall Risk Score >5  Goal: Patient will remain free of falls  Outcome: Progressing     Problem: Pain interferes with ability to perform ADL  Goal: Pain at adequate level as identified by patient  Outcome: Progressing  Flowsheets (Taken 06/23/2022 1835)  Pain at adequate level as identified by patient:   Assess for risk of opioid induced respiratory depression, including snoring/sleep apnea. Alert healthcare team of risk factors identified.   Reassess pain within 30-60 minutes of any procedure/intervention, per Pain Assessment, Intervention, Reassessment (AIR) Cycle   Assess pain on admission, during daily assessment and/or before any "as needed" intervention(s)   Evaluate if patient comfort function goal is met   Evaluate patient's satisfaction with pain management progress   Offer non-pharmacological pain management interventions   Include patient/patient care companion in decisions related to pain management as needed     Problem: Side Effects from Pain Analgesia  Goal: Patient will experience minimal side effects of analgesic therapy  Outcome: Progressing     Problem: Compromised Hemodynamic Status  Goal: Vital signs and fluid balance maintained/improved  Outcome: Progressing  Flowsheets (Taken 06/23/2022 1835)  Vital signs and fluid balance are maintained/improved:   Monitor and compare daily weight   Monitor/assess vitals and hemodynamic parameters  with position changes   Position patient for maximum circulation/cardiac output   Monitor intake and output. Notify LIP if urine output is less than 30 mL/hour.   Monitor/assess lab values and report abnormal values     Problem: Impaired Mobility  Goal: Mobility/Activity is maintained at optimal level for patient  Outcome: Progressing  Flowsheets (Taken 06/23/2022 1835)  Mobility/activity is maintained at optimal level for patient:   Encourage independent activity per ability   Increase mobility as tolerated/progressive mobility   Maintain proper body alignment   Perform active/passive ROM   Plan activities to conserve energy, plan rest periods   Reposition patient every 2 hours and as needed unless able to reposition self   Assess for changes in respiratory status, level of consciousness and/or development of fatigue     Problem: Peripheral Neurovascular Impairment  Goal: Extremity color, movement, sensation are maintained or improved  Outcome: Progressing  Flowsheets (Taken 06/23/2022 1835)  Extremity color, movement, sensation are maintained or improved:   Increase mobility as tolerated/progressive mobility   Assess and monitor application of corrective devices (cast, brace, splint), check skin integrity   Assess extremity for proper alignment   Teach/review/reinforce ankle pump exercises   VTE Prevention: Administer anticoagulant(s) and/or apply anti-embolism stockings/devices as ordered     Problem: Compromised skin integrity  Goal: Skin integrity is maintained or improved  Outcome: Progressing     Problem: Nutrition  Goal: Nutritional intake is adequate  Outcome: Progressing     Problem: Compromised Tissue integrity  Goal: Damaged tissue is healing and protected  Outcome: Progressing  Flowsheets (Taken 06/23/2022 1835)  Damaged tissue is healing and protected:  Monitor/assess Braden scale every shift   Increase activity as tolerated/progressive mobility   Relieve pressure to bony prominences for patients at  moderate and high risk   Avoid shearing injuries   Keep intact skin clean and dry   Use bath wipes, not soap and water, for daily bathing   Encourage use of lotion/moisturizer on skin   Monitor patient's hygiene practices   Monitor external devices/tubes for correct placement to prevent pressure, friction and shearing  Goal: Nutritional status is improving  Outcome: Progressing

## 2022-06-23 NOTE — Anesthesia Procedure Notes (Signed)
Peripheral Nerve Block    Patient location during procedure: OR  Reason for block: Post-op pain management      Injection technique: Single Shot  Block Region: Popliteal Sciatic  Laterality: Right      Block at surgeon's request Yes        Staffing  Anesthesiologist: Rocklyn Mayberry J, MD  Performed: Anesthesiologist       Pre-procedure Checklist   Completed: patient identified, surgical consent, pre-op evaluation, timeout performed, risks and benefits discussed, anesthesia consent given and correct site        Peripheral Block  Patient monitoring: NIBP, EKG, Pulse oximetry and Nasal cannula O2  Premedication: Yes and Meaningful contact maintained  Local infiltration: Lidocaine 1%      Needle  Needle type: Stim needle   Needle gauge: 20 G  Needle length: 10 cm      Guidance: ultrasound guided  Ultrasound Guided: LA spread visualized, Image stored or printed, Needle visualized and Relevant anatomy identified (nerve, vessels, muscle)          Assessment   Incremental injection: yes  Injection made incrementally with aspirations every 5 mL.  Injection Resistance: no  Paresthesia Pain: no    Blood Aspirated: No  no suspected intravascular injection  Block Outcome: No complications and Successful block

## 2022-06-24 ENCOUNTER — Inpatient Hospital Stay: Payer: Medicare Other

## 2022-06-24 MED ORDER — GABAPENTIN 300 MG PO CAPS
400.0000 mg | ORAL_CAPSULE | Freq: Three times a day (TID) | ORAL | Status: DC
Start: 2022-06-24 — End: 2022-06-27
  Administered 2022-06-24 – 2022-06-27 (×10): 400 mg via ORAL
  Filled 2022-06-24 (×9): qty 1

## 2022-06-24 MED ORDER — ACETAMINOPHEN 500 MG PO TABS
1000.0000 mg | ORAL_TABLET | Freq: Three times a day (TID) | ORAL | Status: DC
Start: 2022-06-24 — End: 2022-06-29
  Administered 2022-06-24 – 2022-06-29 (×15): 1000 mg via ORAL
  Filled 2022-06-24 (×15): qty 2

## 2022-06-24 MED ORDER — METHOCARBAMOL 500 MG PO TABS
750.0000 mg | ORAL_TABLET | Freq: Four times a day (QID) | ORAL | Status: DC
Start: 2022-06-24 — End: 2022-06-27
  Administered 2022-06-24 – 2022-06-27 (×12): 750 mg via ORAL
  Filled 2022-06-24 (×12): qty 2

## 2022-06-24 NOTE — Plan of Care (Signed)
Problem: Moderate/High Fall Risk Score >5  Goal: Patient will remain free of falls  Outcome: Progressing  Flowsheets (Taken 06/24/2022 0900)  High (Greater than 13):   HIGH-Consider use of low bed   HIGH-Initiate use of floor mats as appropriate   HIGH-Utilize chair pad alarm for patient while in the chair   HIGH-Bed alarm on at all times while patient in bed     Problem: Pain interferes with ability to perform ADL  Goal: Pain at adequate level as identified by patient  Outcome: Progressing  Flowsheets (Taken 06/24/2022 1125)  Pain at adequate level as identified by patient:   Evaluate if patient comfort function goal is met   Evaluate patient's satisfaction with pain management progress   Offer non-pharmacological pain management interventions     Problem: Side Effects from Pain Analgesia  Goal: Patient will experience minimal side effects of analgesic therapy  Outcome: Progressing     Problem: Compromised Hemodynamic Status  Goal: Vital signs and fluid balance maintained/improved  Outcome: Progressing  Flowsheets (Taken 06/24/2022 1125)  Vital signs and fluid balance are maintained/improved:   Position patient for maximum circulation/cardiac output   Monitor/assess vitals and hemodynamic parameters with position changes     Problem: Impaired Mobility  Goal: Mobility/Activity is maintained at optimal level for patient  Outcome: Progressing     Problem: Peripheral Neurovascular Impairment  Goal: Extremity color, movement, sensation are maintained or improved  Outcome: Progressing     Problem: Compromised skin integrity  Goal: Skin integrity is maintained or improved  Outcome: Progressing  Flowsheets (Taken 06/24/2022 1125)  Skin integrity is maintained or improved:   Assess Braden Scale every shift   Turn or reposition patient every 2 hours or as needed unless able to reposition self   Increase activity as tolerated/progressive mobility   Avoid shearing   Keep skin clean and dry     Problem: Nutrition  Goal:  Nutritional intake is adequate  Outcome: Progressing  Flowsheets (Taken 06/24/2022 1125)  Nutritional intake is adequate:   Assist patient with meals/food selection   Encourage/perform oral hygiene as appropriate   Allow adequate time for meals     Problem: Compromised Tissue integrity  Goal: Damaged tissue is healing and protected  Outcome: Progressing  Flowsheets (Taken 06/24/2022 1125)  Damaged tissue is healing and protected:   Monitor/assess Braden scale every shift   Keep intact skin clean and dry   Avoid shearing injuries   Use bath wipes, not soap and water, for daily bathing  Goal: Nutritional status is improving  Outcome: Progressing
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
## 2022-06-24 NOTE — Op Note (Signed)
error 

## 2022-06-25 MED ORDER — VITAMIN B-12 500 MCG PO TABS
1000.0000 ug | ORAL_TABLET | Freq: Every day | ORAL | Status: DC
Start: 2022-06-25 — End: 2022-06-29
  Administered 2022-06-25 – 2022-06-29 (×4): 1000 ug via ORAL
  Filled 2022-06-25 (×4): qty 2

## 2022-06-25 MED ORDER — MVW COMPLETE FORMULATION PO CHEW
0.5000 | CHEWABLE_TABLET | Freq: Every day | ORAL | Status: DC
Start: 2022-06-25 — End: 2022-06-29
  Administered 2022-06-25 – 2022-06-28 (×3): 0.5 via ORAL
  Filled 2022-06-25 (×6): qty 1

## 2022-06-25 MED ORDER — NEPHRO-VITE 0.8 MG PO TABS
1.0000 | ORAL_TABLET | Freq: Every day | ORAL | Status: DC
Start: 2022-06-25 — End: 2022-06-29
  Administered 2022-06-25 – 2022-06-29 (×4): 1 via ORAL
  Filled 2022-06-25 (×6): qty 1

## 2022-06-25 MED ORDER — CALCIUM CITRATE-VITAMIN D 315-6.25 MG-MCG PO TABS
1.0000 | ORAL_TABLET | Freq: Three times a day (TID) | ORAL | Status: DC
Start: 2022-06-25 — End: 2022-06-29
  Administered 2022-06-25 – 2022-06-29 (×11): 1 via ORAL
  Filled 2022-06-25 (×13): qty 1

## 2022-06-25 MED ORDER — NEPHRO-VITE 0.8 MG PO TABS
1.0000 | ORAL_TABLET | Freq: Every day | ORAL | Status: DC
Start: 2022-06-25 — End: 2022-06-25

## 2022-06-25 MED ORDER — VITAMINS/MINERALS PO TABS
1.0000 | ORAL_TABLET | Freq: Two times a day (BID) | ORAL | Status: DC
Start: 2022-06-25 — End: 2022-06-29
  Administered 2022-06-25 – 2022-06-28 (×5): 1 via ORAL
  Filled 2022-06-25 (×10): qty 1

## 2022-06-25 MED ORDER — FERROUS SULFATE 324 (65 FE) MG PO TBEC
324.0000 mg | DELAYED_RELEASE_TABLET | Freq: Every morning | ORAL | Status: DC
Start: 2022-06-25 — End: 2022-06-29
  Administered 2022-06-25 – 2022-06-29 (×4): 324 mg via ORAL
  Filled 2022-06-25 (×4): qty 1
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
## 2022-06-25 NOTE — Plan of Care (Signed)
Problem: Moderate/High Fall Risk Score >5  Goal: Patient will remain free of falls  Outcome: Progressing     Problem: Pain interferes with ability to perform ADL  Goal: Pain at adequate level as identified by patient  Outcome: Progressing     Problem: Side Effects from Pain Analgesia  Goal: Patient will experience minimal side effects of analgesic therapy  Outcome: Progressing     Problem: Compromised Hemodynamic Status  Goal: Vital signs and fluid balance maintained/improved  Outcome: Progressing     Problem: Impaired Mobility  Goal: Mobility/Activity is maintained at optimal level for patient  Outcome: Progressing     Problem: Peripheral Neurovascular Impairment  Goal: Extremity color, movement, sensation are maintained or improved  Outcome: Progressing     Problem: Compromised skin integrity  Goal: Skin integrity is maintained or improved  Outcome: Progressing     Problem: Nutrition  Goal: Nutritional intake is adequate  Outcome: Progressing     Problem: Compromised Tissue integrity  Goal: Damaged tissue is healing and protected  Outcome: Progressing  Goal: Nutritional status is improving  Outcome: Progressing

## 2022-06-25 NOTE — Plan of Care (Signed)
Problem: Impaired Mobility  Goal: Mobility/Activity is maintained at optimal level for patient  06/25/2022 0313 by Urbano Heir, RN  Outcome: Progressing  06/25/2022 0313 by Urbano Heir, RN  Outcome: Progressing     Problem: Compromised skin integrity  Goal: Skin integrity is maintained or improved  06/25/2022 0313 by Urbano Heir, RN  Outcome: Progressing  06/25/2022 0313 by Urbano Heir, RN  Outcome: Progressing     Problem: Nutrition  Goal: Nutritional intake is adequate  06/25/2022 0313 by Urbano Heir, RN  Outcome: Progressing  06/25/2022 0313 by Urbano Heir, RN  Outcome: Progressing     Problem: Compromised Tissue integrity  Goal: Damaged tissue is healing and protected  06/25/2022 0313 by Urbano Heir, RN  Outcome: Progressing  06/25/2022 0313 by Urbano Heir, RN  Outcome: Progressing  Goal: Nutritional status is improving  06/25/2022 0313 by Urbano Heir, RN  Outcome: Progressing  06/25/2022 0313 by Urbano Heir, RN  Outcome: Progressing
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
## 2022-06-25 NOTE — Plan of Care (Signed)
Problem: Pain interferes with ability to perform ADL  Goal: Pain at adequate level as identified by patient  Outcome: Progressing     Problem: Side Effects from Pain Analgesia  Goal: Patient will experience minimal side effects of analgesic therapy  Outcome: Progressing     Problem: Compromised Hemodynamic Status  Goal: Vital signs and fluid balance maintained/improved  Outcome: Progressing     Problem: Impaired Mobility  Goal: Mobility/Activity is maintained at optimal level for patient  Outcome: Progressing     Problem: Compromised skin integrity  Goal: Skin integrity is maintained or improved  Outcome: Progressing
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
## 2022-06-26 ENCOUNTER — Ambulatory Visit: Admission: RE | Admit: 2022-06-26 | Payer: Medicare Other | Source: Ambulatory Visit | Admitting: Orthopaedic Surgery

## 2022-06-26 ENCOUNTER — Inpatient Hospital Stay: Payer: Medicare Other | Admitting: Anesthesiology

## 2022-06-26 ENCOUNTER — Inpatient Hospital Stay: Payer: Medicare Other

## 2022-06-26 ENCOUNTER — Encounter
Admission: AC | Disposition: A | Discharge status: Rehabilitation Facility | Payer: Self-pay | Source: Other Acute Inpatient Hospital | Attending: Surgery

## 2022-06-26 DIAGNOSIS — S52571A Other intraarticular fracture of lower end of right radius, initial encounter for closed fracture: Secondary | ICD-10-CM | POA: Insufficient documentation

## 2022-06-26 HISTORY — PX: REMOVE, MODIFY, UPPER EXTREMITY EXTERNAL FIXATOR: SHX5188

## 2022-06-26 HISTORY — PX: ORIF, RADIUS, DISTAL (WRIST): SHX4911

## 2022-06-26 LAB — CBC
Absolute NRBC: 0 10*3/uL (ref 0.00–0.00)
Hematocrit: 27.5 % — ABNORMAL LOW (ref 34.7–43.7)
Hgb: 8.5 g/dL — ABNORMAL LOW (ref 11.4–14.8)
MCH: 29.8 pg (ref 25.1–33.5)
MCHC: 30.9 g/dL — ABNORMAL LOW (ref 31.5–35.8)
MCV: 96.5 fL — ABNORMAL HIGH (ref 78.0–96.0)
MPV: 9.7 fL (ref 8.9–12.5)
Nucleated RBC: 0 /100 WBC (ref 0.0–0.0)
Platelets: 511 10*3/uL — ABNORMAL HIGH (ref 142–346)
RBC: 2.85 10*6/uL — ABNORMAL LOW (ref 3.90–5.10)
RDW: 17 % — ABNORMAL HIGH (ref 11–15)
WBC: 7.11 10*3/uL (ref 3.10–9.50)

## 2022-06-26 LAB — BASIC METABOLIC PANEL
Anion Gap: 8 (ref 5.0–15.0)
BUN: 20 mg/dL (ref 7.0–21.0)
CO2: 23 mEq/L (ref 17–29)
Calcium: 8.7 mg/dL (ref 8.5–10.5)
Chloride: 111 mEq/L (ref 99–111)
Creatinine: 0.7 mg/dL (ref 0.4–1.0)
Glucose: 93 mg/dL (ref 70–100)
Potassium: 4.1 mEq/L (ref 3.5–5.3)
Sodium: 142 mEq/L (ref 135–145)
eGFR: >60 mL/min/{1.73_m2} (ref >=60)

## 2022-06-26 LAB — TYPE AND SCREEN
AB Screen Gel: NEGATIVE
ABO Rh: A NEG

## 2022-06-26 SURGERY — ORIF, RADIUS, DISTAL (WRIST)
Anesthesia: Anesthesia General | Site: Arm Lower | Laterality: Right | Wound class: Clean

## 2022-06-26 MED ORDER — FENTANYL CITRATE (PF) 50 MCG/ML IJ SOLN (WRAP)
25.0000 ug | INTRAMUSCULAR | Status: DC | PRN
Start: 2022-06-26 — End: 2022-06-26

## 2022-06-26 MED ORDER — GLYCOPYRROLATE 0.2 MG/ML IJ SOLN (WRAP)
INTRAMUSCULAR | Status: AC
Start: 2022-06-26 — End: ?
  Filled 2022-06-26: qty 1

## 2022-06-26 MED ORDER — DEXAMETHASONE SODIUM PHOSPHATE 4 MG/ML IJ SOLN (WRAP)
INTRAMUSCULAR | Status: DC | PRN
Start: 2022-06-26 — End: 2022-06-26
  Administered 2022-06-26: 8 mg via INTRAVENOUS

## 2022-06-26 MED ORDER — OXYCODONE HCL 10 MG PO TABS
ORAL_TABLET | ORAL | Status: AC
Start: 2022-06-26 — End: 2022-06-26
  Administered 2022-06-26: 10 mg via ORAL
  Filled 2022-06-26: qty 1

## 2022-06-26 MED ORDER — FENTANYL CITRATE (PF) 50 MCG/ML IJ SOLN (WRAP)
INTRAMUSCULAR | Status: DC | PRN
Start: 2022-06-26 — End: 2022-06-26
  Administered 2022-06-26: 100 ug via INTRAMUSCULAR

## 2022-06-26 MED ORDER — PROPOFOL 10 MG/ML IV EMUL (WRAP)
INTRAVENOUS | Status: DC | PRN
Start: 2022-06-26 — End: 2022-06-26
  Administered 2022-06-26: 150 mg via INTRAVENOUS

## 2022-06-26 MED ORDER — ROCURONIUM BROMIDE 10 MG/ML IV SOLN (WRAP)
INTRAVENOUS | Status: DC | PRN
Start: 2022-06-26 — End: 2022-06-26
  Administered 2022-06-26: 50 mg via INTRAVENOUS

## 2022-06-26 MED ORDER — HYDROMORPHONE HCL 0.5 MG/0.5 ML IJ SOLN
0.5000 mg | INTRAMUSCULAR | Status: DC | PRN
Start: 2022-06-26 — End: 2022-06-26

## 2022-06-26 MED ORDER — STERILE WATER FOR INJECTION IJ/IV SOLN (WRAP)
2.0000 g | Freq: Three times a day (TID) | INTRAMUSCULAR | Status: AC
Start: 2022-06-26 — End: 2022-06-26
  Administered 2022-06-26 (×2): 2 g via INTRAVENOUS
  Filled 2022-06-26 (×2): qty 2000

## 2022-06-26 MED ORDER — PROPOFOL 10 MG/ML IV EMUL (WRAP)
INTRAVENOUS | Status: AC
Start: 2022-06-26 — End: ?
  Filled 2022-06-26: qty 20

## 2022-06-26 MED ORDER — NEOSTIGMINE METHYLSULFATE 1 MG/ML IJ/IV SOLN (WRAP)
Status: AC
Start: 2022-06-26 — End: ?
  Filled 2022-06-26: qty 5

## 2022-06-26 MED ORDER — FAMOTIDINE 10 MG/ML IV SOLN (WRAP)
INTRAVENOUS | Status: DC | PRN
Start: 2022-06-26 — End: 2022-06-26
  Administered 2022-06-26: 20 mg via INTRAVENOUS

## 2022-06-26 MED ORDER — PHENYLEPHRINE 100 MCG/ML IV BOLUS (ANESTHESIA)
PREFILLED_SYRINGE | INTRAVENOUS | Status: DC | PRN
Start: 2022-06-26 — End: 2022-06-26
  Administered 2022-06-26: 100 ug via INTRAVENOUS

## 2022-06-26 MED ORDER — MIDAZOLAM HCL 1 MG/ML IJ SOLN (WRAP)
INTRAMUSCULAR | Status: DC | PRN
Start: 2022-06-26 — End: 2022-06-26
  Administered 2022-06-26: 2 mg via INTRAVENOUS

## 2022-06-26 MED ORDER — ACETAMINOPHEN 325 MG PO TABS
650.0000 mg | ORAL_TABLET | Freq: Once | ORAL | Status: DC | PRN
Start: 2022-06-26 — End: 2022-06-26

## 2022-06-26 MED ORDER — GABAPENTIN 300 MG PO CAPS
300.0000 mg | ORAL_CAPSULE | Freq: Once | ORAL | Status: AC
Start: 2022-06-26 — End: 2022-06-26
  Administered 2022-06-26: 300 mg via ORAL

## 2022-06-26 MED ORDER — GLYCOPYRROLATE 0.2 MG/ML IJ SOLN (WRAP)
INTRAMUSCULAR | Status: DC | PRN
Start: 2022-06-26 — End: 2022-06-26
  Administered 2022-06-26: .4 mg via INTRAVENOUS
  Administered 2022-06-26: .2 mg via INTRAVENOUS

## 2022-06-26 MED ORDER — GABAPENTIN 300 MG PO CAPS
ORAL_CAPSULE | ORAL | Status: AC
Start: 2022-06-26 — End: 2022-06-26
  Filled 2022-06-26: qty 1

## 2022-06-26 MED ORDER — OXYCODONE-ACETAMINOPHEN 5-325 MG PO TABS
1.0000 | ORAL_TABLET | Freq: Once | ORAL | Status: DC | PRN
Start: 2022-06-26 — End: 2022-06-26

## 2022-06-26 MED ORDER — PHENYLEPHRINE HCL-NACL 100-0.9 MG/250ML-% IV SOLN
INTRAVENOUS | Status: AC
Start: 2022-06-26 — End: ?
  Filled 2022-06-26: qty 250

## 2022-06-26 MED ORDER — LIDOCAINE HCL 2 % IJ SOLN
INTRAMUSCULAR | Status: DC | PRN
Start: 2022-06-26 — End: 2022-06-26
  Administered 2022-06-26: 100 mg

## 2022-06-26 MED ORDER — ACETAMINOPHEN 500 MG PO TABS
1000.0000 mg | ORAL_TABLET | Freq: Once | ORAL | Status: DC
Start: 2022-06-26 — End: 2022-06-26

## 2022-06-26 MED ORDER — PROPOFOL INFUSION 10 MG/ML
INTRAVENOUS | Status: DC | PRN
Start: 2022-06-26 — End: 2022-06-26
  Administered 2022-06-26: 40 ug/kg/min via INTRAVENOUS

## 2022-06-26 MED ORDER — NEOSTIGMINE METHYLSULFATE 1 MG/ML IJ/IV SOLN (WRAP)
Status: DC | PRN
Start: 2022-06-26 — End: 2022-06-26
  Administered 2022-06-26: 5 mg via INTRAVENOUS

## 2022-06-26 MED ORDER — LIDOCAINE HCL (PF) 2 % IJ SOLN
INTRAMUSCULAR | Status: AC
Start: 2022-06-26 — End: ?
  Filled 2022-06-26: qty 5

## 2022-06-26 MED ORDER — LACTATED RINGERS IV SOLN
INTRAVENOUS | Status: AC
Start: 2022-06-26 — End: 2022-06-26

## 2022-06-26 MED ORDER — CEFAZOLIN SODIUM 1 G IJ SOLR
INTRAMUSCULAR | Status: AC
Start: 2022-06-26 — End: ?
  Filled 2022-06-26: qty 1000

## 2022-06-26 MED ORDER — OXYCODONE HCL 5 MG PO TABS
5.0000 mg | ORAL_TABLET | Freq: Once | ORAL | Status: DC | PRN
Start: 2022-06-26 — End: 2022-06-26

## 2022-06-26 MED ORDER — PROMETHAZINE HCL 25 MG/ML IJ SOLN
INTRAMUSCULAR | Status: AC
Start: 2022-06-26 — End: ?
  Filled 2022-06-26: qty 1

## 2022-06-26 MED ORDER — METHOCARBAMOL 500 MG PO TABS
ORAL_TABLET | ORAL | Status: AC
Start: 2022-06-26 — End: 2022-06-26
  Administered 2022-06-26: 750 mg via ORAL
  Filled 2022-06-26: qty 2

## 2022-06-26 MED ORDER — ONDANSETRON HCL 4 MG/2ML IJ SOLN
INTRAMUSCULAR | Status: AC
Start: 2022-06-26 — End: ?
  Filled 2022-06-26: qty 2

## 2022-06-26 MED ORDER — CEFAZOLIN SODIUM-DEXTROSE 2-3 GM-%(50ML) IV SOLR
INTRAVENOUS | Status: AC
Start: 2022-06-26 — End: ?
  Filled 2022-06-26: qty 50

## 2022-06-26 MED ORDER — DEXMEDETOMIDINE HCL 200 MCG/2ML IV SOLN
INTRAVENOUS | Status: AC
Start: 2022-06-26 — End: ?
  Filled 2022-06-26: qty 2

## 2022-06-26 MED ORDER — ALBUMIN HUMAN/BIOSIMILIAR 5% IV SOLN (WRAP)
INTRAVENOUS | Status: AC
Start: 2022-06-26 — End: ?
  Filled 2022-06-26: qty 250

## 2022-06-26 MED ORDER — ONDANSETRON HCL 4 MG/2ML IJ SOLN
4.0000 mg | Freq: Once | INTRAMUSCULAR | Status: DC | PRN
Start: 2022-06-26 — End: 2022-06-26

## 2022-06-26 MED ORDER — PROPOFOL 10 MG/ML IV EMUL (WRAP)
INTRAVENOUS | Status: AC
Start: 2022-06-26 — End: ?
  Filled 2022-06-26: qty 100

## 2022-06-26 MED ORDER — MIDAZOLAM HCL 1 MG/ML IJ SOLN (WRAP)
2.0000 mg | Freq: Once | INTRAMUSCULAR | Status: AC
Start: 2022-06-26 — End: 2022-06-26
  Administered 2022-06-26: 2 mg via INTRAVENOUS

## 2022-06-26 MED ORDER — PROMETHAZINE HCL 25 MG/ML IJ SOLN
INTRAMUSCULAR | Status: DC | PRN
Start: 2022-06-26 — End: 2022-06-26
  Administered 2022-06-26: 12.5 mg via INTRAVENOUS

## 2022-06-26 MED ORDER — DEXAMETHASONE SODIUM PHOSPHATE 20 MG/5ML IJ SOLN
INTRAMUSCULAR | Status: AC
Start: 2022-06-26 — End: ?
  Filled 2022-06-26: qty 5

## 2022-06-26 MED ORDER — MIDAZOLAM HCL 1 MG/ML IJ SOLN (WRAP)
INTRAMUSCULAR | Status: AC
Start: 2022-06-26 — End: ?
  Filled 2022-06-26: qty 2

## 2022-06-26 MED ORDER — ROPIVACAINE HCL 5 MG/ML IJ SOLN
INTRAMUSCULAR | Status: DC | PRN
Start: 2022-06-26 — End: 2022-06-26
  Administered 2022-06-26: 20 mL via EPIDURAL

## 2022-06-26 MED ORDER — PHENYLEPHRINE 100 MCG/ML IV SOSY (WRAP)
PREFILLED_SYRINGE | INTRAVENOUS | Status: AC
Start: 2022-06-26 — End: ?
  Filled 2022-06-26: qty 10

## 2022-06-26 MED ORDER — FENTANYL CITRATE (PF) 50 MCG/ML IJ SOLN (WRAP)
50.0000 ug | Freq: Once | INTRAMUSCULAR | Status: AC
Start: 2022-06-26 — End: 2022-06-26
  Administered 2022-06-26: 50 ug via INTRAVENOUS

## 2022-06-26 MED ORDER — GABAPENTIN 300 MG PO CAPS
ORAL_CAPSULE | ORAL | Status: AC
Start: 2022-06-26 — End: ?
  Filled 2022-06-26: qty 1

## 2022-06-26 MED ORDER — LACTATED RINGERS IV SOLN
INTRAVENOUS | Status: DC | PRN
Start: 2022-06-26 — End: 2022-06-26

## 2022-06-26 MED ORDER — FENTANYL CITRATE (PF) 50 MCG/ML IJ SOLN (WRAP)
INTRAMUSCULAR | Status: AC
Start: 2022-06-26 — End: ?
  Filled 2022-06-26: qty 2

## 2022-06-26 MED ORDER — ROCURONIUM BROMIDE 50 MG/5ML IV SOLN
INTRAVENOUS | Status: AC
Start: 2022-06-26 — End: ?
  Filled 2022-06-26: qty 5

## 2022-06-26 MED ORDER — GABAPENTIN 100 MG PO CAPS
ORAL_CAPSULE | ORAL | Status: AC
Start: 2022-06-26 — End: 2022-06-26
  Filled 2022-06-26: qty 1

## 2022-06-26 MED ORDER — STERILE WATER FOR INJECTION IJ/IV SOLN (WRAP)
2.0000 g | INTRAMUSCULAR | Status: AC
Start: 2022-06-26 — End: 2022-06-26
  Administered 2022-06-26: 1 g via INTRAVENOUS
  Administered 2022-06-26: 2 g via INTRAVENOUS

## 2022-06-26 MED ORDER — SODIUM CHLORIDE 0.9 % IR SOLN
Status: DC | PRN
Start: 2022-06-26 — End: 2022-06-26
  Administered 2022-06-26: 1000 mL

## 2022-06-26 MED ORDER — FENTANYL CITRATE (PF) 50 MCG/ML IJ SOLN (WRAP)
INTRAMUSCULAR | Status: DC | PRN
Start: 2022-06-26 — End: 2022-06-26
  Administered 2022-06-26: 50 ug via INTRAVENOUS

## 2022-06-26 MED ORDER — GLYCOPYRROLATE 0.2 MG/ML IJ SOLN (WRAP)
INTRAMUSCULAR | Status: AC
Start: 2022-06-26 — End: ?
  Filled 2022-06-26: qty 2

## 2022-06-26 MED ORDER — ONDANSETRON HCL 4 MG/2ML IJ SOLN
INTRAMUSCULAR | Status: DC | PRN
Start: 2022-06-26 — End: 2022-06-26
  Administered 2022-06-26: 4 mg via INTRAVENOUS

## 2022-06-26 SURGICAL SUPPLY — 85 items
APPLICATOR CHLORAPREP 26 ML 70% ISOPROPYL ALCOHOL 2% CHLORHEXIDINE (Applicator) ×3 IMPLANT
APPLICATOR PRP 70% ISPRP 2% CHG 26ML (Applicator) ×3
BAG EQP 28X36IN BAND (Other)
BAG EQUIPMENT L28 IN X W36 IN BAND (Other) IMPLANT
BANDAGE CMPR PLSTR CTTN PRCR 5YDX4IN LF (Procedure Accessories) ×1
BANDAGE GAUZE L3.6 YD X W3.4 IN 6 PLY ABSORBENT STRETCH TIGHT FINISH (Bandage) ×1 IMPLANT
BANDAGE GZE CTTN BLK2 3.6YDX3.4IN LF (Bandage) ×1
BANDAGE MEDLINE GAUZE L3.6 YD X W3.4 IN (Bandage) ×1
BANDAGE PLASTER SPECIALIST PLASTER OF (Cast)
BANDAGE PLASTER SPECIALIST PLASTER OF PARIS L5 YD X W4 IN SMOOTH (Cast) IMPLANT
BANDAGE PLSTR PLSTR OF PARIS SPCLST 5YD (Cast)
BANDAGE PROCARE COMP L5 YD X W4 IN 2 CLIP FASTENER SLF CLSR PLSTR CTTN (Procedure Accessories) ×1 IMPLANT
BANDAGE PROCARE COMPRESSION L5 YD X W4 (Procedure Accessories) ×1
COVER FLEXIBLE LIGHT HANDLE PLASTIC GREEN (Procedure Accessories) ×3 IMPLANT
COVER FLEXIBLE MEDLINE LIGHT HANDLE (Procedure Accessories) ×3
COVER LGHT HNDL PLS LF STRL FLXB DISP (Procedure Accessories) ×3
CUFF TOURNIQUET CYLINDRICAL L18 IN X W4 (Procedure Accessories) ×1
CUFF TOURNIQUET CYLINDRICAL L18 IN X W4 IN 2 PORT BLADDER ATS (Procedure Accessories) ×1 IMPLANT
CUFF TRNQT CYL ATS 18X4IN LF STRL 2 PORT (Procedure Accessories) ×1
DRAPE 84X54IN MINI C ARM OEC 6800 DISPOSABLE (Drape) ×1 IMPLANT
DRAPE CARM MN 84X54IN DISP OEC 6800 (Drape) ×1
DRAPE SRG TBRN CNVRT 72X60IN LF STRL U (Drape) ×1
DRAPE SRG TBRN XL CNVRT 98X77IN LF STRL (Drape) ×2
DRAPE SURGICAL SHEET FANFOLD L98 IN X (Drape) ×2
DRAPE SURGICAL SHEET FANFOLD L98 IN X W77 IN TIBURON XL PINK (Drape) ×2 IMPLANT
DRAPE SURGICAL U STRIP IMPERVIOUS ADHESIVE SPLIT L72 IN X W60 IN (Drape) ×1 IMPLANT
DRESSING PETRO 3% BI 3BRM GZE XR 9X5IN (Dressing) ×1
DRESSING PETROLATUM L9 IN X W5 IN NONADHESIVE OCCLUSIVE BACTERIOSTATIC (Dressing) ×1 IMPLANT
ELECTRODE ADULT PATIENT RETURN L9 FT REM POLYHESIVE ACRYLIC FOAM (Procedure Accessories) ×1 IMPLANT
ELECTRODE PATIENT RETURN L9 FT VALLEYLAB (Procedure Accessories) ×1
ELECTRODE PT RTN RM PHSV ACRL FM C30- LB (Procedure Accessories) ×1
GLOVE SRG NTR RBR 8 INDCTR BGL 299X103MM (Glove) ×1
GLOVE SRG PLISPRN 8 BGL PI MIC LF STRL (Glove) ×2
GLOVE SURGICAL 8 BIOGEL PI MICRO POWDER (Glove) ×2
GLOVE SURGICAL 8 BIOGEL PI MICRO POWDER FREE BEAD CUFF MICRO ROUGHENED (Glove) ×2 IMPLANT
GLOVE SURGICAL 8 INDICATOR BIOGEL POWDER (Glove) ×1
GLOVE SURGICAL 8 INDICATOR BIOGEL POWDER FREE SMOOTH BEAD CUFF (Glove) ×1 IMPLANT
PADDING CAST L4 YD X W4 IN COHESION HAND TEARABLE SELF BOND SPECIALIST (Procedure Accessories) IMPLANT
PADDING CAST L4 YD X W4 IN UNDERCAST (Cast) ×1
PADDING CAST L4 YD X W4 IN UNDERCAST MILD STRETCH COHESIVE REGULAR (Cast) ×1 IMPLANT
PADDING CST CTTN SPCLST 100 4YDX4IN LF (Procedure Accessories)
PADDING CST CTTN WBRL 4YDX4IN LF STRL (Cast) ×1
PLATE BN SS LCP CMBN 43X7.5X1.6MM NS 5 (Plate) ×1 IMPLANT
PLATE L43 MM X W7.5 MM X H1.6 MM RADIUS (Plate) ×1 IMPLANT
PLATE L43 MM X W7.5 MM X H1.6 MM RADIUS RIGHT DISTAL VOLAR 5 HOLE HEAD (Plate) ×1 IMPLANT
SCREW BN SS T4 1.5MM 18MM NS ST SLF RTN (Screw) ×2 IMPLANT
SCREW BN SS T4 1.5MM 20MM NS ST SLF RTN (Screw) ×1 IMPLANT
SCREW BN SS T4 1.5MM 24MM NS ST STRDR (Screw) ×2 IMPLANT
SCREW BN SS T8 2.4MM 20MM NS ST STRDR (Screw) ×1 IMPLANT
SCREW BN SS T8 2.7MM 5MM 16MM NS ST (Screw) ×1 IMPLANT
SCREW BN SS T8 2.7MM 5MM 18MM NS ST (Screw) ×1 IMPLANT
SCREW BN SS T8 2.7MM 5MM 20MM NS ST (Screw) ×1 IMPLANT
SCREW L16 MM OD2.7 MM ODSEC5 MM T8 (Screw) ×1 IMPLANT
SCREW L16 MM OD2.7 MM ODSEC5 MM T8 STAINLESS STEEL CORTEX SELF TAP (Screw) ×1 IMPLANT
SCREW L18 MM OD1.5 MM T4 STAINLESS STEEL (Screw) ×2 IMPLANT
SCREW L18 MM OD1.5 MM T4 STAINLESS STEEL CORTEX SELF TAP SELF RETAIN (Screw) ×2 IMPLANT
SCREW L18 MM OD2.7 MM ODSEC5 MM T8 (Screw) ×1 IMPLANT
SCREW L18 MM OD2.7 MM T8 STAINLESS STEEL CORTEX SELF TAP STARDRIVE (Screw) ×1 IMPLANT
SCREW L20 MM OD1.5 MM T4 STAINLESS STEEL (Screw) ×1 IMPLANT
SCREW L20 MM OD1.5 MM T4 STAINLESS STEEL CORTEX SELF TAP SELF RETAIN (Screw) ×1 IMPLANT
SCREW L20 MM OD2.4 MM T8 STAINLESS STEEL CORTEX SELF TAP STARDRIVE (Screw) ×1 IMPLANT
SCREW L20 MM OD2.7 MM ODSEC5 MM T8 (Screw) ×1 IMPLANT
SCREW L20 MM OD2.7 MM ODSEC5 MM T8 STAINLESS STEEL CORTEX SELF TAP (Screw) ×1 IMPLANT
SCREW L24 MM OD1.5 MM T4 STAINLESS STEEL (Screw) ×2 IMPLANT
SCREW L24 MM OD1.5 MM T4 STAINLESS STEEL CORTEX SELF TAP STARDRIVE (Screw) ×2 IMPLANT
SOLUTION ANSEP 70% ISPRP 4OZ LF RUB BTL (Scrub Supplies) ×1
SOLUTION ANTISEPTIC RUB BOTTLE MEDLINE (Scrub Supplies) ×1 IMPLANT
SPLINT ORTH PLSTR OF PARIS SPCLST 15X4IN (Cast) ×1
SPLINT ORTHOPEDIC 15X4IN FAST SET PLASTER OF PARIS SPECIALIST LF (Cast) ×1 IMPLANT
SPONGE GAUZE L4 IN X W4 IN 12 PLY (Sponge) ×2 IMPLANT
SPONGE GZE PLS CTTN CRTY 4X4IN LF STRL (Sponge) ×2
SPONGE LAP CTTN 18X18IN LF STRL 4 PLY (Sponge) ×1
SPONGE LAPAROTOMY L18 IN X W18 IN 4 PLY (Sponge) ×1
SPONGE LAPAROTOMY L18 IN X W18 IN 4 PLY RADIOPAQUE PYRONEMA FREE HIGH (Sponge) ×1 IMPLANT
STAPLER SKIN L4.1 MM X W6.5 MM 35 WIDE (Staplers)
STAPLER SKIN L4.1 MM X W6.5 MM 35 WIDE STAPLE CARTRIDGE APPOSE ULC (Staplers) IMPLANT
STAPLER SKN SS PLS APS U 4.1X6.5MM LF 35 (Staplers)
TOWEL L27 IN X W17 IN COTTON PREWASH (Other) ×1
TOWEL L27 IN X W17 IN COTTON PREWASH DELINT HIGH ABSORBENT BLUE (Other) ×1 IMPLANT
TOWEL SRG CTTN 27X17IN LF STRL PREWASH (Other) ×1
TRAY SRG HAND UPPER EXTREMITY ~~LOC~~ (Pack) ×1
TRAY SURGICAL HAND UPPER EXTREMITY ~~LOC~~ (Pack) ×1 IMPLANT
WIRE FIXATION OD1.6 MM L150 MM SYNTHES (Guide Wire) ×1
WIRE FIXATION OD1.6 MM L150 MM SYNTHES TROCAR POINT (Guide Wire) ×1 IMPLANT
WIRE FX KRSH 1.6MM 150MM TROC PNT (Guide Wire) ×1

## 2022-06-26 NOTE — Plan of Care (Signed)
Problem: Moderate/High Fall Risk Score >5  Goal: Patient will remain free of falls  Outcome: Progressing     Problem: Pain interferes with ability to perform ADL  Goal: Pain at adequate level as identified by patient  Outcome: Progressing     Problem: Side Effects from Pain Analgesia  Goal: Patient will experience minimal side effects of analgesic therapy  Outcome: Progressing     Problem: Compromised Hemodynamic Status  Goal: Vital signs and fluid balance maintained/improved  Outcome: Progressing     Problem: Impaired Mobility  Goal: Mobility/Activity is maintained at optimal level for patient  Outcome: Progressing     Problem: Peripheral Neurovascular Impairment  Goal: Extremity color, movement, sensation are maintained or improved  Outcome: Progressing     Problem: Compromised skin integrity  Goal: Skin integrity is maintained or improved  Outcome: Progressing     Problem: Nutrition  Goal: Nutritional intake is adequate  Outcome: Progressing
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
## 2022-06-26 NOTE — Op Note (Signed)
Date of Procedure: 06/26/22    Preoperative diagnosis: Closed right Intra-articular Distal radius fracture    Postoperative diagnosis: same    Procedure:  Open reduction internal fixation right Distal radius  fracture, distal radius CPT's: Intra-articular greater than 3 parts, 25609  2. Remove external fixator under anesthesia, 31497    Surgeon: Emiliano Dyer MD [phone]- Orthopaedic Trauma)    Resident/physician assistant: Shirlyn Goltz, MD    Estimated blood loss: minimal    Anesthesia: General plus regional block    Complications: None    Indications for procedure: Please see Ortho Trauma consultation/H&P and/or outpatient clinic notes for details related to patient's presentation.  I have discussed the indications, risks, benefits, possibility of multiple staged surgeries, and anticipated healing and rehab course regarding this injury and surgery.  Additionally, I have discussed realistic short, intermediate, and long term outcomes related to the current clinical presentation.  Surgical consent was obtained.     Risks of surgery: Pain, infection, nerve and/or vascular injury, bleeding, hardware problems or failure, arthritis, avascular necrosis, malunion, nonunion, refracture, dislocation, leg length inequality, malalignment, complex regional pain syndrome, neuropathy, anemia, medical complications including but not limited to cardiovascular, respiratory, neurologic, blood clots, and death.    Description of procedure: The patient was identified in the preoperative holding area, the correct limb was marked, and the consent was verified.  The patient was transferred to the operating room, anesthesia was administered, then they were positioned supine. All bony prominences were well padded and the patient was secured to the operating table. A tourniquet was applied and the limb was prepped and draped in normal sterile fashion.  A final timeout was performed and all parties were in agreement.  The arm was elevated,  exsanguinated with an Esmarch bandage, tourniquet was raised to Hg.      A standard volar FCR approach was performed to the distal radius.  Sharp   dissection was carried down and the interval between the FCR and radial   artery was identified.  Small branches off the radial artery were   coagulated with a Bovie.  The volar musculature was swept ulnarly.  The   pronator quadratus was released from its radial insertion.  The fracture   was cleaned of interposed hematoma and overhanging periosteum.      The fracture fragments were open reduced and stabilized with provisional fixation.  Even with open reduction there was still dorsal tilt oh some K wires were used through the open wound as well as radially and dorsally as joysticks.  Ultimately a mini stab incision was made dorsally and using a freer elevator for percutaneous reduction then K wires ultimately were able to hold this.  Once fluoroscopy demonstrated an acceptable reduction definitive fixation was performed using a Synthes locking distal radius plate in hybrid mode with nonlocking screws in the shaft and locking screws in the articular segment.  Final x-rays   confirmed the fracture was well aligned and the hardware was appropriately   sized and positioned.  Throughout the case the external fixator was adjusted as needed and then gradually deconstruct did once the fracture had some stability.  After all the fixation was then and x-rays look good the external fixator was completely disassembled and removed from the wrist    The pronator was repaired to its origin with 2-0 Monocryl sutures.  The tourniquet was let down and the radial artery had a good visible and palpable pulse.  Hemostasis was achieved and the wound was thoroughly  irrigated.  Subcutaneous tissue   was closed with 2-0 Monocryl suture and the skin was closed with horizontal mattress Monocryl sutures. Sterile dressings were applied and a well padded short arm volar splint was applied.  The patient was brought to the recovery room in stable condition.     POSTOPERATIVE DISPOSITION:  - Non-weightbearing to this right upper extremity for 6 weeks.    - Start active and passive range of motion at the first postop visit when the soft tissues       are more amenable.

## 2022-06-26 NOTE — OR PreOp (Signed)
Assisted anesthesiologist Dr Andros with placing right supraclavicular single shot PNB in preop/pacu. Patient was medicated with IV sedation, see MAR for details. VVS, bedside monitor on. Sterile protocol maintained. Patient tolerated procedure well. No s/s LAST observed.  Discussed expected outcomes, (pain control,decreased opioid requirements and side effects, limb safety) prior to preforming nerve block and patient verbalized understanding.

## 2022-06-26 NOTE — Interval H&P Note (Signed)
Pt presents today for surgery as scheduled.     Please refer to recent office/hospital notes for full detail    Past Medical/Surgical History, Meds/Allergies- reviewed    Physical Exam Update:  Neuro- Awake and alert  Cardiac- RRR  Pulmonary-  +BS  Abdomen-  No acute findings    Musculoskeletal exam noted previously    A/P:  Proceed with surgery as scheduled.  The patient(family) was counseled as to the indications, goals, options, alternatives, risks, benefits, and potential complications of surgical intervention, including infection, bleeding, pain, failure, non/mal-union, gait issues, failure to recover, secondary fracture, dislocation, leg length discrepancy, stiffness, weakness, injury to local structures,  need for further surgery, wound problems, as well as potential Medical and anesthesia complications, including death, MI, CVA, VTE pneumonia, UTI, among others.  She/He/They understand(s) and wish(es) to proceed. In addition, the anticipated healing and rehabilitative course was discussed. Opportunity was given to ask and receive answers to all relevant questions.    Necie Wilcoxson, MD  Attending Orthopaedic Traumatologist  [facility]  [phone]  Pager [phone]

## 2022-06-26 NOTE — Anesthesia Procedure Notes (Signed)
Peripheral Nerve Block    Patient location during procedure: Pre-Op  Reason for block: Post-op pain management      Injection technique: Single Shot  Block Region: Supraclavicular  Laterality: Right      Block at surgeon's request Yes        Staffing  Anesthesiologist: Kamree Wiens G, MD  Performed: Anesthesiologist       Pre-procedure Checklist   Completed: patient identified, surgical consent, pre-op evaluation, timeout performed, risks and benefits discussed, anesthesia consent given and correct site        Peripheral Block  Patient monitoring: NIBP, EKG, Pulse oximetry and Nasal cannula O2  Patient position: Supine  Premedication: Yes and Meaningful contact maintained  Local infiltration: Lidocaine 1%      Needle  Needle type: Other   Needle gauge: 20 G  Needle length: 10 cm      Guidance: ultrasound guided  Ultrasound Guided: LA spread visualized, Image stored or printed, Needle visualized and Relevant anatomy identified (nerve, vessels, muscle)          Assessment   Incremental injection: yes  Injection made incrementally with aspirations every 5 mL.  Injection Resistance: no  Paresthesia Pain: no    Blood Aspirated: No  no suspected intravascular injection  Patient tolerated procedure well: Yes  Block Outcome: No complications, Successful block and Pain improved

## 2022-06-26 NOTE — Anesthesia Postprocedure Evaluation (Signed)
Anesthesia Post Evaluation    Patient: Laura Mclaughlin    Procedure(s):  ORIF, RIGHT RADIUS, DISTAL (WRIST)  REMOVAL EXTERNAL FIXATOR    Anesthesia type: general    Last Vitals:   Vitals Value Taken Time   BP 134/64 06/26/22 1050   Temp 37 C (98.6 F) 06/26/22 1032   Pulse 76 06/26/22 1050   Resp 18 06/26/22 1050   SpO2 96 % 06/26/22 1050                 Anesthesia Post Evaluation:     Patient Evaluated: PACU  Patient Participation: complete - patient participated  Level of Consciousness: awake and alert  Pain Score: 2  Pain Management: adequate  Multimodal analgesia pain management approach    Airway Patency: patent    Anesthetic complications: No      PONV Status: none    Cardiovascular status: hemodynamically stable  Respiratory status: spontaneous ventilation  Hydration status: euvolemic        Signed by: Joselyn Arrow, MD, 06/26/2022 10:59 AM

## 2022-06-26 NOTE — Brief Op Note (Signed)
Date/Time: 10:14 AM, 06/26/2022    Patient Name:     Laura Mclaughlin     Date of Operation:     06/26/2022     Providers Performing:     Surgeon(s) and Role:     * Amado Nash, MD - Primary     * Madilyn Fireman, MD - Resident - Assisting      Diagnosis:     Right distal radius fracture    Procedure:     Right distal radius fracture ORIF      Anesthesia:      General  Joselyn Arrow, MD  Anesthesiologist: Joselyn Arrow, MD  CRNA: Naida Sleight, CRNA        Estimated Blood Loss:     * No values recorded between 06/26/2022  7:28 AM and 06/26/2022 10:14 AM *     Implants:     Implant Name Type Inv. Item Serial No. Manufacturer Lot No. LRB No. Used Action   PLATE BN SS LCP CMBN 43X7.5X1. NS 5 - [id number] Plate PLATE BN SS LCP CMBN 43X7.5X1. NS 5  [employer]  Right 1 Implanted   SCREW BN SS T8 2.4MM NS ST STRDR - [id number] Screw SCREW BN SS T8 2.4MM NS ST STRDR  [employer]  Right 1 Implanted   SCREW BN SS T8 2.7MM  NS ST - [id number] Screw SCREW BN SS T8 2.7MM  NS ST  [employer]  Right 1 Implanted   SCREW BN SS T8 2.7MM  NS ST - [id number] Screw SCREW BN SS T8 2.7MM  NS ST  [employer]  Right 1 Implanted   SCREW BN SS T4 1.5MM NS ST SLF RTN - [id number] Screw SCREW BN SS T4 1.5MM NS ST SLF RTN  [employer]  Right 2 Implanted   SCREW BN SS T4 1.5MM NS ST SLF RTN - [id number] Screw SCREW BN SS T4 1.5MM NS ST SLF RTN  [employer]  Right 1 Implanted   SCREW BN SS T4 1.5MM NS ST STRDR - [id number] Screw SCREW BN SS T4 1.5MM NS ST STRDR  [employer]  Right 2 Implanted        Drains:     none     Complications:     none     Weightbearing Status:     No Weight Right upper extremity     WB Status of other injured extremities: NWB BLE    Hip precautions? No    Postop Plan (Wound mgt, Return to OR, etc):     Wound Management:  - Definitively Closed    DVT Prophylaxis:   - Continue Lovenox given bilateral lower  extremity fractures (x6 weeks from 8/7 then may convert to ASA while still in wheelchair)    Foley catheter status: Does not have Foley    Dressings:  - Splint/Cast: Do not remove splint or cast, as this will be addressed at the first follow-up appointment. Please keep splint/cast clean and dry.    Antibiotics:  - Will continue standard prophylaxis x24 hours    Plan:  - No further orthopedic surgical interventions planned      Durward Mallard, MD  PGY-4, Orthopaedic Surgery  Orthopaedics Pager: [phone]  Orthopaedics Phone: [phone]

## 2022-06-26 NOTE — Plan of Care (Signed)
Problem: Moderate/High Fall Risk Score >5  Goal: Patient will remain free of falls  Outcome: Progressing  Flowsheets (Taken 06/25/2022 2000)  High (Greater than 13):   HIGH-Consider use of low bed   HIGH-Bed alarm on at all times while patient in bed   MOD-Remain with patient during toileting     Problem: Pain interferes with ability to perform ADL  Goal: Pain at adequate level as identified by patient  Outcome: Progressing  Flowsheets (Taken 06/24/2022 1125 by Guy Franco, RN)  Pain at adequate level as identified by patient:   Evaluate if patient comfort function goal is met   Evaluate patient's satisfaction with pain management progress   Offer non-pharmacological pain management interventions     Problem: Side Effects from Pain Analgesia  Goal: Patient will experience minimal side effects of analgesic therapy  Outcome: Progressing  Flowsheets (Taken 06/19/2022 1408 by Milingis, Crystal, RN)  Patient will experience minimal side effects of analgesic therapy:   Monitor/assess patient's respiratory status (RR depth, effort, breath sounds)   Assess for changes in cognitive function   Prevent/manage side effects per LIP orders (i.e. nausea, vomiting, pruritus, constipation, urinary retention, etc.)   Evaluate for opioid-induced sedation with appropriate assessment tool (i.e. POSS)     Problem: Compromised Hemodynamic Status  Goal: Vital signs and fluid balance maintained/improved  Outcome: Progressing  Flowsheets (Taken 06/24/2022 1125 by Guy Franco, RN)  Vital signs and fluid balance are maintained/improved:   Position patient for maximum circulation/cardiac output   Monitor/assess vitals and hemodynamic parameters with position changes     Problem: Impaired Mobility  Goal: Mobility/Activity is maintained at optimal level for patient  Outcome: Progressing  Flowsheets (Taken 06/23/2022 1835 by Helane Rima, RN)  Mobility/activity is maintained at optimal level for patient:   Encourage independent activity per  ability   Increase mobility as tolerated/progressive mobility   Maintain proper body alignment   Perform active/passive ROM   Plan activities to conserve energy, plan rest periods   Reposition patient every 2 hours and as needed unless able to reposition self   Assess for changes in respiratory status, level of consciousness and/or development of fatigue     Problem: Peripheral Neurovascular Impairment  Goal: Extremity color, movement, sensation are maintained or improved  Outcome: Progressing  Flowsheets (Taken 06/23/2022 1835 by Helane Rima, RN)  Extremity color, movement, sensation are maintained or improved:   Increase mobility as tolerated/progressive mobility   Assess and monitor application of corrective devices (cast, brace, splint), check skin integrity   Assess extremity for proper alignment   Teach/review/reinforce ankle pump exercises   VTE Prevention: Administer anticoagulant(s) and/or apply anti-embolism stockings/devices as ordered     Problem: Compromised skin integrity  Goal: Skin integrity is maintained or improved  Outcome: Progressing  Flowsheets (Taken 06/24/2022 1125 by Guy Franco, RN)  Skin integrity is maintained or improved:   Assess Braden Scale every shift   Turn or reposition patient every 2 hours or as needed unless able to reposition self   Increase activity as tolerated/progressive mobility   Avoid shearing   Keep skin clean and dry     Problem: Nutrition  Goal: Nutritional intake is adequate  Outcome: Progressing  Flowsheets (Taken 06/24/2022 1125 by Guy Franco, RN)  Nutritional intake is adequate:   Assist patient with meals/food selection   Encourage/perform oral hygiene as appropriate   Allow adequate time for meals     Problem: Compromised Tissue integrity  Goal: Damaged tissue is  healing and protected  Outcome: Progressing  Flowsheets (Taken 06/24/2022 1125 by Guy Franco, RN)  Damaged tissue is healing and protected:   Monitor/assess Braden scale every shift   Keep  intact skin clean and dry   Avoid shearing injuries   Use bath wipes, not soap and water, for daily bathing  Goal: Nutritional status is improving  Outcome: Progressing  Flowsheets (Taken 06/26/2022 0212)  Nutritional status is improving: Allow adequate time for meals

## 2022-06-26 NOTE — Transfer of Care (Signed)
Anesthesia Transfer of Care Note    Patient: Laura Mclaughlin    Procedures performed: Procedure(s):  ORIF, RIGHT RADIUS, DISTAL (WRIST)  REMOVAL EXTERNAL FIXATOR    Anesthesia type: General ETT    Patient location:Phase I PACU    Last vitals:   Vitals:    06/26/22 1050   BP: 134/64   Pulse: 76   Resp: 18   Temp:    SpO2: 96%       Post pain: Patient not complaining of pain, continue current therapy      Mental Status:awake and alert     Respiratory Function: tolerating face mask    Cardiovascular: stable    Nausea/Vomiting: patient not complaining of nausea or vomiting    Hydration Status: adequate    Post assessment: no apparent anesthetic complications and no reportable events    Signed by: Joselyn Arrow, MD  06/26/22 11:00 AM
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
## 2022-06-27 ENCOUNTER — Encounter: Payer: Self-pay | Admitting: Orthopaedic Surgery

## 2022-06-27 LAB — CBC
Absolute NRBC: 0 10*3/uL (ref 0.00–0.00)
Hematocrit: 31 % — ABNORMAL LOW (ref 34.7–43.7)
Hgb: 9.4 g/dL — ABNORMAL LOW (ref 11.4–14.8)
MCH: 29.5 pg (ref 25.1–33.5)
MCHC: 30.3 g/dL — ABNORMAL LOW (ref 31.5–35.8)
MCV: 97.2 fL — ABNORMAL HIGH (ref 78.0–96.0)
MPV: 9.5 fL (ref 8.9–12.5)
Nucleated RBC: 0 /100 WBC (ref 0.0–0.0)
Platelets: 529 10*3/uL — ABNORMAL HIGH (ref 142–346)
RBC: 3.19 10*6/uL — ABNORMAL LOW (ref 3.90–5.10)
RDW: 18 % — ABNORMAL HIGH (ref 11–15)
WBC: 9.06 10*3/uL (ref 3.10–9.50)

## 2022-06-27 MED ORDER — METHOCARBAMOL 500 MG PO TABS
1000.0000 mg | ORAL_TABLET | Freq: Four times a day (QID) | ORAL | Status: DC
Start: 2022-06-27 — End: 2022-06-29
  Administered 2022-06-27 – 2022-06-29 (×8): 1000 mg via ORAL
  Filled 2022-06-27 (×8): qty 2

## 2022-06-27 MED ORDER — GABAPENTIN 300 MG PO CAPS
600.0000 mg | ORAL_CAPSULE | Freq: Three times a day (TID) | ORAL | Status: DC
Start: 2022-06-27 — End: 2022-06-29
  Administered 2022-06-27 – 2022-06-29 (×6): 600 mg via ORAL
  Filled 2022-06-27 (×6): qty 2

## 2022-06-27 MED ORDER — HYDROMORPHONE HCL 1 MG/ML IJ SOLN
0.4000 mg | Freq: Once | INTRAMUSCULAR | Status: AC
Start: 2022-06-27 — End: 2022-06-27
  Administered 2022-06-27: 0.4 mg via INTRAVENOUS
  Filled 2022-06-27: qty 1
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
## 2022-06-27 NOTE — PT Progress Note (Signed)
Physical Therapy Treatment Laura Mclaughlin  O330/O330.01   POST ACUTE CARE THERAPY RECOMMENDATIONS:      Discharge Recommendations:  Acute Rehab    If the above recommended discharge disposition is not available, patient will need Maximal assistance for functional mobility and HHPT.    DME needs IF patient is discharging home: [facility], Hospital bed, [facility]    Therapy discharge recommendations may change with patient status.  Please refer to most recent note for up-to-date recommendations.    Patient is very motivated with physical therapy and will tolerate 3 hours of therapy per day, 5 days a week. Recommend discharge to acute rehab in order to maximize independence prior to returning home.     ASSESSMENT:     Significant Findings: none    Patient in bed upon arrival, amenable to physical therapy treatment session. Patient politely declined to transfer to EOB secondary to having just done it with OT. Reviewed BLE HEP exercises in supine position. Patient will continue to benefit from inpatient PT services to maximize functional mobility and independence.    Treatment Activities: therapeutic exercise    Educated the patient to role of physical therapy, plan of care, goals of therapy and HEP.     PLAN:   Treatment/Interventions: Exercise, Neuromuscular re-education, Functional transfer training, LE strengthening/ROM, Endurance training, Patient/family training, Equipment eval/education, Bed mobility, Compensatory technique education    PT Frequency: 4-5x/wk    Continue plan of care.       Precautions and Contraindications:   Falls  B LE NWB  R UE platform WB  L UE WBAT  C-spine precautions  CTO AAT     SUBJECTIVE: "I just got up with OT"   Patient's medical condition is appropriate for Physical Therapy intervention at this time.  Patient is agreeable to participation in the therapy session. Nursing clears patient for therapy.    Pain Assessment:  No report of pain     OBJECTIVE:   Observation of  Patient:   Patient with CTO in place.  PPE worn during session: gloves    Functional Mobility:  PMP - Progressive Mobility Protocol   PMP Activity: Step 2 - Supine Exercises  Distance Walked (ft) (Step 6,7): 0 Feet    Therex (Supine):  Ankle pumps: 10 reps bilaterally  Glute sets: 10 reps bilaterally  Quad sets: 10 reps bilaterally  Heel slides: 10 reps bilaterally    Patient Participation: good  Patient Endurance: fair    Patient left with call bell within reach, all needs met, and all questions answered    SCD's off (as found)  Floor mat in place  Bed alarm on    Goals:  Goals  Goal Formulation: With patient/family  Time for Goal Acheivement: 7 visits  Goals: Select goal  Pt Will Go Supine To Sit: with minimal assist, to maximize functional mobility and independence, 7 visits  Pt Will Perform Sit To Supine: with minimal assist, to maximize functional mobility and independence, 7 visits  Pt Will Transfer Bed/Chair: with maximal assist, to maximize functional mobility and independence, 7 visits  Pt Will Propel Wheelchair: 10-50 feet, with moderate assist, to maximize functional mobility and independence, 10 visits    Time of Treatment  PT Received On: 06/27/22  Start Time: 1545  Stop Time: 1615  Time Calculation (min): 30 min  Treatment # 5 out of 7 visits    Daleen Squibb, PT, DPT

## 2022-06-27 NOTE — OT Progress Note (Signed)
[facility]   Occupational Therapy Treatment     Patient: Laura Mclaughlin    MRN#: [id number]   Unit: [facility] ORIG BLDG 3  Bed: O330/O330.01      Post Acute Care Therapy Recommendations:   Discharge Recommendations: Acute Rehab   Patient anticipated to benefit from and to be able to engage in 3 hours of therapy a day for 5 days a week.       DME Recommended for Discharge: [employer], [facility], Shower chair, [employer], Hospital bed, Other (Comment) (Patient will need above equipment if d/c to home)    If Acute Rehab  recommended discharge disposition is not available, patient will need hands on assist for basic adls, bed mobility and transfers, above equipment, and HHOT.     Therapy discharge recommendations may change with patient status.  Please refer to most recent note for up-to-date recommendations.    Assessment:   Significant Findings: none    Pt agreeable to OT and benefited from receiving pain medication prior to session (8/10 prior to medication, 5/10 after medication).  Husband present t/o session.  Pt able to demonstrate bed mobility for sup to sit with CGA, supine and eob exercises with CGA and was able to scoot without weight bearing up the bed in prep for t/f training with/without a sliding board.  Pt demonstrates and reports high motivation and dedication for return to full functional status.   recommend Skilled OT     Treatment Activities: adl re-ed, t/f training    Educated the patient to role of occupational therapy, plan of care, goals of therapy and HEP, safety with mobility and ADLs, home safety.    Plan:    OT Frequency Recommended: 3-4x/wk     Continue plan of care.       Precautions and Contraindications:   NWB BLE and RUE, WBAT LUE, CTO at all times    Updated Medical Status/Imaging/Labs:  reviewed    Subjective: "I know I can do this, I'll just keep going"   Patient's medical condition is appropriate for Occupational Therapy intervention at this time.  Patient  is agreeable to participation in the therapy session. Family and/or guardian are agreeable to patient's participation in the therapy session. Nursing clears patient for therapy.    Pain:   Scale: 5/10  Location: RUE  Intervention: pt reported this was manageable and was an improvement, pt was able to move and reposition arm on own.    Objective:   Patient is in bed with dressings, SCD's, peripheral IV, and female external catheter in place.  Pt wore mask during therapy session:No      Cognition  A and O x 4    Functional Mobility  Rolling:  mod/min A  Supine to Sit: CGA      PMP Activity: Step 4 - Dangle at Bedside    Balance  Static Sitting: good  Dynamic Sitting: good      Therapeutic Exercises  With acitivty, ed for AP, QS, GS    Participation: good  Endurance: good    Patient left with call bell within reach, all needs met,  and all questions answered. RN notified of session outcome and patient response.     Goals:  Time For Goal Achievement: 5 visits  ADL Goals  Patient will groom self: Contact Guard Assist  Patient will dress upper body: Minimal Assist  Patient will dress lower body: Minimal Assist, with AE  Patient will toilet: Moderate Assist  Mobility and Transfer Goals  Pt will perform functional transfers: Moderate Assist                           PPE worn during session: procedural mask and gloves    Tech present: no  PPE worn by tech: N/A    Bari Mantis, OTD, OTR/L  [phone]      Time of Treatment  OT Received On: 06/27/22  Start Time: 1240  Stop Time: 1338  Time Calculation (min): 58 min    Treatment # 1 of 5 visits

## 2022-06-27 NOTE — Plan of Care (Signed)
Problem: Pain interferes with ability to perform ADL  Goal: Pain at adequate level as identified by patient  Outcome: Progressing  Flowsheets (Taken 06/24/2022 1125 by Guy Franco, RN)  Pain at adequate level as identified by patient:   Evaluate if patient comfort function goal is met   Evaluate patient's satisfaction with pain management progress   Offer non-pharmacological pain management interventions     Problem: Compromised Hemodynamic Status  Goal: Vital signs and fluid balance maintained/improved  Outcome: Progressing  Flowsheets (Taken 06/24/2022 1125 by Guy Franco, RN)  Vital signs and fluid balance are maintained/improved:   Position patient for maximum circulation/cardiac output   Monitor/assess vitals and hemodynamic parameters with position changes     Problem: Impaired Mobility  Goal: Mobility/Activity is maintained at optimal level for patient  Outcome: Progressing  Flowsheets (Taken 06/23/2022 1835 by Helane Rima, RN)  Mobility/activity is maintained at optimal level for patient:   Encourage independent activity per ability   Increase mobility as tolerated/progressive mobility   Maintain proper body alignment   Perform active/passive ROM   Plan activities to conserve energy, plan rest periods   Reposition patient every 2 hours and as needed unless able to reposition self   Assess for changes in respiratory status, level of consciousness and/or development of fatigue     Problem: Compromised skin integrity  Goal: Skin integrity is maintained or improved  Outcome: Progressing  Flowsheets (Taken 06/24/2022 1125 by Guy Franco, RN)  Skin integrity is maintained or improved:   Assess Braden Scale every shift   Turn or reposition patient every 2 hours or as needed unless able to reposition self   Increase activity as tolerated/progressive mobility   Avoid shearing   Keep skin clean and dry     Problem: Nutrition  Goal: Nutritional intake is adequate  Outcome: Progressing  Flowsheets (Taken  06/24/2022 1125 by Guy Franco, RN)  Nutritional intake is adequate:   Assist patient with meals/food selection   Encourage/perform oral hygiene as appropriate   Allow adequate time for meals

## 2022-06-27 NOTE — Plan of Care (Signed)
Problem: Moderate/High Fall Risk Score >5  Goal: Patient will remain free of falls  Outcome: Progressing  Flowsheets (Taken 06/27/2022 1322)  Moderate Risk (6-13):   LOW-Fall Interventions Appropriate for Low Fall Risk   MOD-Consider activation of bed alarm if appropriate   MOD-Floor mat at bedside (where available) if appropriate   MOD-Remain with patient during toileting   MOD-Re-orient confused patients   LOW-Anticoagulation education for injury risk   MOD-Apply bed exit alarm if patient is confused   MOD-Consider a move closer to Nurses Station   MOD-Place bedside commode and assistive devices out of sight when not in use   MOD-Utilize diversion activities  VH Moderate Risk (6-13):   ALL REQUIRED LOW INTERVENTIONS   INITIATE YELLOW "FALL RISK" SIGNAGE   YELLOW NON-SKID SLIPPERS   YELLOW "FALL RISK" ARM BAND   USE OF BED EXIT ALARM IF PATIENT IS CONFUSED OR IMPULSIVE. PLACE RESET BED ALARM SIGN ABOVE BED  VH High Risk (Greater than 13):   ALL REQUIRED LOW INTERVENTIONS   ALL REQUIRED MODERATE INTERVENTIONS   RED "HIGH FALL RISK" SIGNAGE     Problem: Pain interferes with ability to perform ADL  Goal: Pain at adequate level as identified by patient  Outcome: Progressing  Flowsheets (Taken 06/27/2022 1322)  Pain at adequate level as identified by patient:   Identify patient comfort function goal   Assess for risk of opioid induced respiratory depression, including snoring/sleep apnea. Alert healthcare team of risk factors identified.   Assess pain on admission, during daily assessment and/or before any "as needed" intervention(s)   Reassess pain within 30-60 minutes of any procedure/intervention, per Pain Assessment, Intervention, Reassessment (AIR) Cycle   Evaluate if patient comfort function goal is met     Problem: Side Effects from Pain Analgesia  Goal: Patient will experience minimal side effects of analgesic therapy  Outcome: Progressing  Flowsheets (Taken 06/27/2022 1322)  Patient will experience minimal side  effects of analgesic therapy:   Monitor/assess patient's respiratory status (RR depth, effort, breath sounds)   Prevent/manage side effects per LIP orders (i.e. nausea, vomiting, pruritus, constipation, urinary retention, etc.)   Assess for changes in cognitive function     Problem: Compromised Hemodynamic Status  Goal: Vital signs and fluid balance maintained/improved  Outcome: Progressing  Flowsheets (Taken 06/27/2022 1322)  Vital signs and fluid balance are maintained/improved:   Position patient for maximum circulation/cardiac output   Monitor/assess vitals and hemodynamic parameters with position changes   Monitor intake and output. Notify LIP if urine output is less than 30 mL/hour.   Monitor and compare daily weight     Problem: Impaired Mobility  Goal: Mobility/Activity is maintained at optimal level for patient  Outcome: Progressing  Flowsheets (Taken 06/27/2022 1322)  Mobility/activity is maintained at optimal level for patient:   Increase mobility as tolerated/progressive mobility   Maintain proper body alignment   Encourage independent activity per ability   Plan activities to conserve energy, plan rest periods   Perform active/passive ROM     Problem: Peripheral Neurovascular Impairment  Goal: Extremity color, movement, sensation are maintained or improved  Outcome: Progressing  Flowsheets (Taken 06/27/2022 1322)  Extremity color, movement, sensation are maintained or improved:   Increase mobility as tolerated/progressive mobility   Assess and monitor application of corrective devices (cast, brace, splint), check skin integrity   Assess extremity for proper alignment   Teach/review/reinforce ankle pump exercises     Problem: Compromised skin integrity  Goal: Skin integrity is maintained or improved  Outcome: Progressing  Flowsheets (Taken 06/27/2022 1322)  Skin integrity is maintained or improved:   Assess Braden Scale every shift   Increase activity as tolerated/progressive mobility   Keep skin clean and  dry   Turn or reposition patient every 2 hours or as needed unless able to reposition self   Relieve pressure to bony prominences   Avoid shearing   Encourage use of lotion/moisturizer on skin     Problem: Nutrition  Goal: Nutritional intake is adequate  Outcome: Progressing  Flowsheets (Taken 06/27/2022 1322)  Nutritional intake is adequate:   Monitor daily weights   Encourage/perform oral hygiene as appropriate   Assist patient with meals/food selection   Allow adequate time for meals   Encourage/administer dietary supplements as ordered (i.e. tube feed, TPN, oral, OGT/NGT, supplements)     Problem: Compromised Tissue integrity  Goal: Damaged tissue is healing and protected  Outcome: Progressing  Flowsheets (Taken 06/27/2022 1322)  Damaged tissue is healing and protected:   Monitor/assess Braden scale every shift   Reposition patient every 2 hours and as needed unless able to reposition self   Provide wound care per wound care algorithm   Relieve pressure to bony prominences for patients at moderate and high risk   Increase activity as tolerated/progressive mobility   Avoid shearing injuries  Goal: Nutritional status is improving  Outcome: Progressing  Flowsheets (Taken 06/27/2022 1322)  Nutritional status is improving:   Assist patient with eating   Collaborate with Clinical Nutritionist   Allow adequate time for meals   Encourage patient to take dietary supplement(s) as ordered
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
## 2022-06-28 MED ORDER — CALCIUM CITRATE-VITAMIN D 315-6.25 MG-MCG PO TABS
1.0000 | ORAL_TABLET | Freq: Three times a day (TID) | ORAL | Status: DC
Start: 2022-06-28 — End: 2023-08-27

## 2022-06-28 MED ORDER — GABAPENTIN 300 MG PO CAPS
600.0000 mg | ORAL_CAPSULE | Freq: Three times a day (TID) | ORAL | 0 refills | Status: DC
Start: 2022-06-28 — End: 2022-09-07

## 2022-06-28 MED ORDER — MAGNESIUM HYDROXIDE 400 MG/5ML PO SUSP
30.0000 mL | Freq: Every day | ORAL | Status: DC | PRN
Start: 2022-06-28 — End: 2022-08-16

## 2022-06-28 MED ORDER — NALOXONE HCL 4 MG/0.1ML NA LIQD
NASAL | 0 refills | Status: DC
Start: 2022-06-28 — End: 2022-08-16

## 2022-06-28 MED ORDER — FERROUS SULFATE 324 (65 FE) MG PO TBEC
324.0000 mg | DELAYED_RELEASE_TABLET | Freq: Every morning | ORAL | Status: DC
Start: 2022-06-29 — End: 2022-08-16

## 2022-06-28 MED ORDER — OXYCODONE HCL 10 MG PO TABS
10.0000 mg | ORAL_TABLET | ORAL | 0 refills | Status: AC | PRN
Start: 2022-06-28 — End: 2022-07-05

## 2022-06-28 MED ORDER — VITAMINS/MINERALS PO TABS
1.0000 | ORAL_TABLET | Freq: Two times a day (BID) | ORAL | Status: DC
Start: 2022-06-28 — End: 2023-09-19

## 2022-06-28 MED ORDER — ACETAMINOPHEN 500 MG PO TABS
1000.0000 mg | ORAL_TABLET | Freq: Three times a day (TID) | ORAL | Status: DC
Start: 2022-06-28 — End: 2022-09-07

## 2022-06-28 MED ORDER — METHOCARBAMOL 1000 MG PO TABS
1000.0000 mg | ORAL_TABLET | Freq: Four times a day (QID) | ORAL | 0 refills | Status: AC
Start: 2022-06-28 — End: ?

## 2022-06-28 MED ORDER — NEPHRO-VITE 0.8 MG PO TABS
1.0000 | ORAL_TABLET | Freq: Every day | ORAL | Status: DC
Start: 2022-06-29 — End: 2023-08-27
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
## 2022-06-28 NOTE — Progress Notes (Addendum)
06/28/22 1247   CMA Tasks   CMA tasks SNF/AR/LTAC referral sent in [facility]     SNF referral placed in [facility].    Plan A Acute Rehab    Plan B SNF    Jeannine Boga, CMA  Case Management Assistant  [facility] Case Management    Addendum:  Discharge summary and AVS needed, medical team notified via Epic chat
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
## 2022-06-28 NOTE — Plan of Care (Signed)
Problem: Moderate/High Fall Risk Score >5  Goal: Patient will remain free of falls  Outcome: Progressing     Problem: Pain interferes with ability to perform ADL  Goal: Pain at adequate level as identified by patient  Outcome: Progressing  Flowsheets (Taken 06/28/2022 1108)  Pain at adequate level as identified by patient:   Identify patient comfort function goal   Assess for risk of opioid induced respiratory depression, including snoring/sleep apnea. Alert healthcare team of risk factors identified.   Assess pain on admission, during daily assessment and/or before any "as needed" intervention(s)   Reassess pain within 30-60 minutes of any procedure/intervention, per Pain Assessment, Intervention, Reassessment (AIR) Cycle   Evaluate if patient comfort function goal is met   Evaluate patient's satisfaction with pain management progress     Problem: Compromised Hemodynamic Status  Goal: Vital signs and fluid balance maintained/improved  Outcome: Progressing     Problem: Impaired Mobility  Goal: Mobility/Activity is maintained at optimal level for patient  Outcome: Progressing     Problem: Peripheral Neurovascular Impairment  Goal: Extremity color, movement, sensation are maintained or improved  Outcome: Progressing  Flowsheets (Taken 06/28/2022 1108)  Extremity color, movement, sensation are maintained or improved:   Increase mobility as tolerated/progressive mobility   Assess and monitor application of corrective devices (cast, brace, splint), check skin integrity   Assess extremity for proper alignment   VTE Prevention: Administer anticoagulant(s) and/or apply anti-embolism stockings/devices as ordered     Problem: Compromised skin integrity  Goal: Skin integrity is maintained or improved  Outcome: Progressing     Problem: Compromised Tissue integrity  Goal: Damaged tissue is healing and protected  Outcome: Progressing  Goal: Nutritional status is improving  Outcome: Progressing
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
## 2022-06-28 NOTE — Progress Notes (Signed)
Update  CM received a call form  hospital liaison with [facility]. Liaison to visit pt. CM provided updated for pt.    Krue Peterka Johnson-Addo, MSW  SW Case Manager  Case Manager Department  [phone]

## 2022-06-28 NOTE — Plan of Care (Signed)
Problem: Pain interferes with ability to perform ADL  Goal: Pain at adequate level as identified by patient  Flowsheets (Taken 06/28/2022 0447)  Pain at adequate level as identified by patient:   Identify patient comfort function goal   Assess for risk of opioid induced respiratory depression, including snoring/sleep apnea. Alert healthcare team of risk factors identified.   Assess pain on admission, during daily assessment and/or before any "as needed" intervention(s)   Reassess pain within 30-60 minutes of any procedure/intervention, per Pain Assessment, Intervention, Reassessment (AIR) Cycle   Evaluate if patient comfort function goal is met   Evaluate patient's satisfaction with pain management progress     Problem: Compromised Hemodynamic Status  Goal: Vital signs and fluid balance maintained/improved  Flowsheets (Taken 06/28/2022 0447)  Vital signs and fluid balance are maintained/improved:   Position patient for maximum circulation/cardiac output   Monitor and compare daily weight   Monitor intake and output. Notify LIP if urine output is less than 30 mL/hour.   Monitor/assess vitals and hemodynamic parameters with position changes     Problem: Impaired Mobility  Goal: Mobility/Activity is maintained at optimal level for patient  Flowsheets (Taken 06/28/2022 0447)  Mobility/activity is maintained at optimal level for patient:   Increase mobility as tolerated/progressive mobility   Perform active/passive ROM   Plan activities to conserve energy, plan rest periods   Encourage independent activity per ability     Problem: Compromised Tissue integrity  Goal: Damaged tissue is healing and protected  Flowsheets (Taken 06/28/2022 0447)  Damaged tissue is healing and protected:   Monitor/assess Braden scale every shift   Provide wound care per wound care algorithm   Reposition patient every 2 hours and as needed unless able to reposition self   Relieve pressure to bony prominences for patients at moderate and high  risk   Avoid shearing injuries

## 2022-06-28 NOTE — Progress Notes (Signed)
06/28/22 1646   CMA Tasks   CMA tasks IMM delivered     2nd IMM    Jeannine Boga, CMA  Case Management Assistant  [facility]
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
## 2022-06-29 NOTE — Progress Notes (Addendum)
Transport UPDATE:    [facility] transport has confirmed pickup at 1830p. CM and bedside RN notified via Epic secure chat.    Addendum:    Cancelled [facility] Transport due to long ETA of 1830p. [facility] now contracted for 1200p pick up time. CM and bedside RN notified via Epic secure chat.    Jeannine Boga, CMA  Case Management Assistant  [facility] Case Management

## 2022-06-29 NOTE — Plan of Care (Signed)
Problem: Moderate/High Fall Risk Score >5  Goal: Patient will remain free of falls  Outcome: Progressing
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
## 2022-06-29 NOTE — Plan of Care (Addendum)
Pt A&Ox4, orders written for d/c to [facility].  Pt and spouse given d/c instructions, new prescriptions in packet for facility, medication reconciliation, pt teaching on new medications, follow up instructions, and instructions on when to call the doctor.  Pt stated understanding of all questions and has no further questions at this time.  Pt's IV will be d/c'd prior to ambulance transport.  Pt will be transported off unit via [facility] ambulance. Attempt x1 to give report to [facility], Report given to [place] of [facility].        Problem: Moderate/High Fall Risk Score >5  Goal: Patient will remain free of falls  Outcome: Adequate for Discharge     Problem: Pain interferes with ability to perform ADL  Goal: Pain at adequate level as identified by patient  Outcome: Adequate for Discharge     Problem: Side Effects from Pain Analgesia  Goal: Patient will experience minimal side effects of analgesic therapy  Outcome: Adequate for Discharge     Problem: Compromised Hemodynamic Status  Goal: Vital signs and fluid balance maintained/improved  Outcome: Adequate for Discharge     Problem: Impaired Mobility  Goal: Mobility/Activity is maintained at optimal level for patient  Outcome: Adequate for Discharge     Problem: Peripheral Neurovascular Impairment  Goal: Extremity color, movement, sensation are maintained or improved  Outcome: Adequate for Discharge     Problem: Compromised skin integrity  Goal: Skin integrity is maintained or improved  Outcome: Adequate for Discharge     Problem: Nutrition  Goal: Nutritional intake is adequate  Outcome: Adequate for Discharge     Problem: Compromised Tissue integrity  Goal: Damaged tissue is healing and protected  Outcome: Adequate for Discharge  Goal: Nutritional status is improving  Outcome: Adequate for Discharge

## 2022-06-29 NOTE — Progress Notes (Signed)
D/c to At  Transport at 12 noon  Bedside nurse aware     06/29/22 1051   Discharge Disposition   Patient preference/choice provided? Yes   Physical Discharge Disposition Acute Rehab   Receiving facility, unit and room number: [facility]  Room: 102   Nursing report phone number: [phone]   Mode of Transportation Ambulance   Pick up time Transport requested submitted for 11:30am by CMA. MMT confirm 6:30pm. [facility] confirmed 12pm   Patient/Family/POA notified of transfer plan Yes   Patient agreeable to discharge plan/expected d/c date? Yes   Family/POA agreeable to discharge plan/expected d/c date? Yes   Bedside nurse notified of transport plan? Yes   CM Interventions   Follow up appointment scheduled? No   Reason no follow up scheduled? Family to schedule   Notified MD? No   Referral made for home health RN visit? No, Other (comment)   Multidisciplinary rounds/family meeting before d/c? Yes   Medicare Checklist   Is this a Medicare patient? Yes     Laura Mclaughlin, MSW  SW Case Manager  Case Manager Department  [phone]

## 2022-06-29 NOTE — Progress Notes (Signed)
06/29/22 0943   CMA Tasks   CMA tasks Transport arranged     CMA arranged stretcher transport with [facility] Transport for patient to [facility] [place] for 1130a per CM request. Pending confirmation. Will update.    Jeannine Boga, CMA  Case Management Assistant  [facility] Case Management

## 2022-06-29 NOTE — Plan of Care (Addendum)
Shift Note:    Neuro: AXO x4. Neurochecks Q 4 hours.   Cardiac: VSS. WNL.  Resp: CTA,diminished  GI: WNL, tol reg diet well  GU: Ext cath, good UOP  Skin: see flow sheet  Mobility: Bed mobility overnight. Able to turn well with 1-2 person assist.  Pain: Pain in RUE and BLE. Scheduled meds, prn Oxy IR, cold packs, and elevation of limbs all effective in pain relief  Central line/Drain/Vac/Ostomy: n/a  High risk meds: n/a  Fall Score: high  Psychosocial: WNL. Supportive husband at bedside.  Isolation: n/a  Plan: PT/OT, Pain management, neusovasc checks, assist with ADLs.        Vitals:    06/28/22 1134 06/28/22 1616 06/28/22 1943 06/28/22 2318   BP: 129/81 121/84 122/77 141/85   Pulse: 94 90 92 89   Resp: 17 17 17 18    Temp: 97.7 F (36.5 C) 98.4 F (36.9 C) 98.8 F (37.1 C) 99 F (37.2 C)   TempSrc: Oral Oral Oral Oral   SpO2: 97% 95% 96% 94%   Weight:       Height:                 Problem: Moderate/High Fall Risk Score >5  Goal: Patient will remain free of falls  Outcome: Progressing  Flowsheets (Taken 06/28/2022 2019)  High (Greater than 13):   HIGH-Initiate use of floor mats as appropriate   HIGH-Apply yellow "Fall Risk" arm band   HIGH-Bed alarm on at all times while patient in bed   HIGH-Visual cue at entrance to patient's room   MOD-Place Fall Risk level on whiteboard in room   MOD-Include family in multidisciplinary POC discussions     Problem: Pain interferes with ability to perform ADL  Goal: Pain at adequate level as identified by patient  Outcome: Progressing  Flowsheets (Taken 06/29/2022 0154)  Pain at adequate level as identified by patient:   Assess for risk of opioid induced respiratory depression, including snoring/sleep apnea. Alert healthcare team of risk factors identified.   Assess pain on admission, during daily assessment and/or before any "as needed" intervention(s)   Evaluate if patient comfort function goal is met   Reassess pain within 30-60 minutes of any procedure/intervention, per  Pain Assessment, Intervention, Reassessment (AIR) Cycle   Evaluate patient's satisfaction with pain management progress   Offer non-pharmacological pain management interventions   Include patient/patient care companion in decisions related to pain management as needed     Problem: Side Effects from Pain Analgesia  Goal: Patient will experience minimal side effects of analgesic therapy  Outcome: Progressing  Flowsheets (Taken 06/29/2022 0154)  Patient will experience minimal side effects of analgesic therapy: Monitor/assess patient's respiratory status (RR depth, effort, breath sounds)     Problem: Compromised Hemodynamic Status  Goal: Vital signs and fluid balance maintained/improved  Outcome: Progressing  Flowsheets (Taken 06/29/2022 0154)  Vital signs and fluid balance are maintained/improved:   Position patient for maximum circulation/cardiac output   Monitor/assess vitals and hemodynamic parameters with position changes     Problem: Impaired Mobility  Goal: Mobility/Activity is maintained at optimal level for patient  Outcome: Progressing  Flowsheets (Taken 06/29/2022 0154)  Mobility/activity is maintained at optimal level for patient:   Increase mobility as tolerated/progressive mobility   Encourage independent activity per ability   Maintain proper body alignment   Perform active/passive ROM   Plan activities to conserve energy, plan rest periods   Reposition patient every 2 hours and as needed unless able  to reposition self   Assess for changes in respiratory status, level of consciousness and/or development of fatigue   Consult/collaborate with Physical Therapy and/or Occupational Therapy     Problem: Peripheral Neurovascular Impairment  Goal: Extremity color, movement, sensation are maintained or improved  Outcome: Progressing  Flowsheets (Taken 06/29/2022 0154)  Extremity color, movement, sensation are maintained or improved:   Increase mobility as tolerated/progressive mobility   Assess and monitor  application of corrective devices (cast, brace, splint), check skin integrity   Assess extremity for proper alignment     Problem: Compromised skin integrity  Goal: Skin integrity is maintained or improved  Outcome: Progressing  Flowsheets (Taken 06/29/2022 0154)  Skin integrity is maintained or improved:   Assess Braden Scale every shift   Turn or reposition patient every 2 hours or as needed unless able to reposition self   Increase activity as tolerated/progressive mobility   Relieve pressure to bony prominences   Avoid shearing   Keep skin clean and dry   Encourage use of lotion/moisturizer on skin   Monitor patient's hygiene practices   Keep head of bed 30 degrees or less (unless contraindicated)     Problem: Nutrition  Goal: Nutritional intake is adequate  Outcome: Progressing  Flowsheets (Taken 06/29/2022 0154)  Nutritional intake is adequate: Allow adequate time for meals     Problem: Compromised Tissue integrity  Goal: Damaged tissue is healing and protected  Outcome: Progressing  Flowsheets (Taken 06/29/2022 0154)  Damaged tissue is healing and protected:   Monitor/assess Braden scale every shift   Reposition patient every 2 hours and as needed unless able to reposition self   Provide wound care per wound care algorithm   Increase activity as tolerated/progressive mobility   Relieve pressure to bony prominences for patients at moderate and high risk   Avoid shearing injuries   Keep intact skin clean and dry   Use bath wipes, not soap and water, for daily bathing   Use incontinence wipes for cleaning urine, stool and caustic drainage. Foley care as needed   Monitor external devices/tubes for correct placement to prevent pressure, friction and shearing   Encourage use of lotion/moisturizer on skin   Monitor patient's hygiene practices
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
## 2022-07-04 ENCOUNTER — Other Ambulatory Visit: Payer: Self-pay | Admitting: Family

## 2022-07-04 DIAGNOSIS — S12190S Other displaced fracture of second cervical vertebra, sequela: Secondary | ICD-10-CM

## 2022-07-05 ENCOUNTER — Telehealth: Payer: Self-pay | Admitting: Family

## 2022-07-10 ENCOUNTER — Encounter (INDEPENDENT_AMBULATORY_CARE_PROVIDER_SITE_OTHER): Payer: Self-pay

## 2022-07-10 ENCOUNTER — Encounter (INDEPENDENT_AMBULATORY_CARE_PROVIDER_SITE_OTHER): Payer: Self-pay | Admitting: Student in an Organized Health Care Education/Training Program

## 2022-07-10 ENCOUNTER — Ambulatory Visit (INDEPENDENT_AMBULATORY_CARE_PROVIDER_SITE_OTHER): Payer: Medicare Other

## 2022-07-10 ENCOUNTER — Ambulatory Visit (INDEPENDENT_AMBULATORY_CARE_PROVIDER_SITE_OTHER): Payer: Medicare Other | Admitting: Student in an Organized Health Care Education/Training Program

## 2022-07-10 DIAGNOSIS — S83261D Peripheral tear of lateral meniscus, current injury, right knee, subsequent encounter: Secondary | ICD-10-CM

## 2022-07-10 DIAGNOSIS — S72402A Unspecified fracture of lower end of left femur, initial encounter for closed fracture: Secondary | ICD-10-CM

## 2022-07-10 DIAGNOSIS — S72492D Other fracture of lower end of left femur, subsequent encounter for closed fracture with routine healing: Secondary | ICD-10-CM

## 2022-07-10 DIAGNOSIS — S52571A Other intraarticular fracture of lower end of right radius, initial encounter for closed fracture: Secondary | ICD-10-CM

## 2022-07-10 DIAGNOSIS — S52571D Other intraarticular fracture of lower end of right radius, subsequent encounter for closed fracture with routine healing: Secondary | ICD-10-CM

## 2022-07-10 DIAGNOSIS — S82141D Displaced bicondylar fracture of right tibia, subsequent encounter for closed fracture with routine healing: Secondary | ICD-10-CM
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
## 2022-07-17 ENCOUNTER — Ambulatory Visit
Admission: RE | Admit: 2022-07-17 | Discharge: 2022-07-17 | Disposition: A | Discharge status: Home / Self Care | Payer: Self-pay | Source: Ambulatory Visit | Attending: Nurse Practitioner | Admitting: Nurse Practitioner

## 2022-07-17 LAB — BASIC METABOLIC PANEL
Anion Gap: 12.7 mMol/L (ref 7.0–18.0)
BUN / Creatinine Ratio: 19.4 Ratio (ref 10.0–30.0)
BUN: 14 mg/dL (ref 7–22)
CO2: 21 mMol/L (ref 20–30)
Calcium: 8.7 mg/dL (ref 8.5–10.5)
Chloride: 113 mMol/L — ABNORMAL HIGH (ref 98–110)
Creatinine: 0.72 mg/dL (ref 0.60–1.20)
EGFR: 95 mL/min/{1.73_m2} (ref 60–150)
Glucose: 92 mg/dL (ref 71–99)
Osmolality Calculated: 285 mOsm/kg (ref 275–300)
Potassium: 3.7 mMol/L (ref 3.5–5.3)
Sodium: 143 mMol/L (ref 136–147)

## 2022-07-17 LAB — CBC AND DIFFERENTIAL
Basophils %: 0.8 % (ref 0.0–3.0)
Basophils Absolute: 0 10*3/uL (ref 0.0–0.3)
Eosinophils %: 5.8 % (ref 0.0–7.0)
Eosinophils Absolute: 0.3 10*3/uL (ref 0.0–0.8)
Hematocrit: 30.1 % — ABNORMAL LOW (ref 36.0–48.0)
Hemoglobin: 9.8 gm/dL — ABNORMAL LOW (ref 12.0–16.0)
Lymphocytes Absolute: 1.5 10*3/uL (ref 0.6–5.1)
Lymphocytes: 27.6 % (ref 15.0–46.0)
MCH: 32 pg (ref 28–35)
MCHC: 33 gm/dL (ref 31–36)
MCV: 98 fL (ref 80–100)
MPV: 7.5 fL (ref 6.0–10.0)
Monocytes Absolute: 0.2 10*3/uL (ref 0.1–1.7)
Monocytes: 4.4 % (ref 3.0–15.0)
Neutrophils %: 61.4 % (ref 42.0–78.0)
Neutrophils Absolute: 3.4 10*3/uL (ref 1.7–8.6)
PLT CT: 217 10*3/uL (ref 130–440)
RBC: 3.06 10*6/uL — ABNORMAL LOW (ref 3.80–5.00)
RDW: 16.3 % — ABNORMAL HIGH (ref 10.5–14.5)
WBC: 5.5 10*3/uL (ref 4.0–11.0)

## 2022-07-17 LAB — VITAMIN B12: Vitamin B-12: 825 pg/mL — ABNORMAL HIGH (ref 213–816)

## 2022-07-18 ENCOUNTER — Ambulatory Visit
Admission: RE | Admit: 2022-07-18 | Discharge: 2022-07-18 | Disposition: A | Discharge status: Home / Self Care | Payer: Self-pay | Source: Ambulatory Visit

## 2022-07-19 ENCOUNTER — Ambulatory Visit
Admission: RE | Admit: 2022-07-19 | Discharge: 2022-07-19 | Disposition: A | Discharge status: Home / Self Care | Payer: Auto Insurance (includes no fault) | Source: Ambulatory Visit | Attending: Family | Admitting: Family

## 2022-07-19 ENCOUNTER — Other Ambulatory Visit: Payer: Self-pay

## 2022-07-19 ENCOUNTER — Ambulatory Visit
Admission: RE | Admit: 2022-07-19 | Discharge: 2022-07-19 | Disposition: A | Discharge status: Home / Self Care | Payer: Self-pay | Source: Ambulatory Visit

## 2022-07-19 DIAGNOSIS — M5031 Other cervical disc degeneration,  high cervical region: Secondary | ICD-10-CM | POA: Insufficient documentation

## 2022-07-19 DIAGNOSIS — S12190S Other displaced fracture of second cervical vertebra, sequela: Secondary | ICD-10-CM | POA: Insufficient documentation

## 2022-07-20 ENCOUNTER — Other Ambulatory Visit: Payer: Self-pay

## 2022-07-20 ENCOUNTER — Encounter: Payer: Self-pay | Admitting: Family

## 2022-07-20 ENCOUNTER — Ambulatory Visit: Payer: Medicare Other | Attending: Family | Admitting: Family

## 2022-07-20 VITALS — BP 109/68 | HR 68

## 2022-07-20 DIAGNOSIS — S12190S Other displaced fracture of second cervical vertebra, sequela: Secondary | ICD-10-CM | POA: Insufficient documentation

## 2022-07-20 DIAGNOSIS — S12190A Other displaced fracture of second cervical vertebra, initial encounter for closed fracture: Secondary | ICD-10-CM
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
## 2022-07-24 ENCOUNTER — Ambulatory Visit
Admission: RE | Admit: 2022-07-24 | Discharge: 2022-07-24 | Disposition: A | Discharge status: Home / Self Care | Payer: Self-pay | Source: Ambulatory Visit | Attending: Nurse Practitioner | Admitting: Nurse Practitioner

## 2022-07-24 LAB — BASIC METABOLIC PANEL
Anion Gap: 12.4 mMol/L (ref 7.0–18.0)
BUN / Creatinine Ratio: 32.8 Ratio — ABNORMAL HIGH (ref 10.0–30.0)
BUN: 22 mg/dL (ref 7–22)
CO2: 24 mMol/L (ref 20–30)
Calcium: 8.7 mg/dL (ref 8.5–10.5)
Chloride: 110 mMol/L (ref 98–110)
Creatinine: 0.67 mg/dL (ref 0.60–1.20)
EGFR: 99 mL/min/{1.73_m2} (ref 60–150)
Glucose: 89 mg/dL (ref 71–99)
Osmolality Calculated: 288 mOsm/kg (ref 275–300)
Potassium: 3.4 mMol/L — ABNORMAL LOW (ref 3.5–5.3)
Sodium: 143 mMol/L (ref 136–147)

## 2022-07-24 LAB — CBC AND DIFFERENTIAL
Basophils %: 0.9 % (ref 0.0–3.0)
Basophils Absolute: 0.1 10*3/uL (ref 0.0–0.3)
Eosinophils %: 6.3 % (ref 0.0–7.0)
Eosinophils Absolute: 0.4 10*3/uL (ref 0.0–0.8)
Hematocrit: 33.1 % — ABNORMAL LOW (ref 36.0–48.0)
Hemoglobin: 10.5 gm/dL — ABNORMAL LOW (ref 12.0–16.0)
Lymphocytes Absolute: 1.6 10*3/uL (ref 0.6–5.1)
Lymphocytes: 27.2 % (ref 15.0–46.0)
MCH: 32 pg (ref 28–35)
MCHC: 32 gm/dL (ref 31–36)
MCV: 100 fL (ref 80–100)
MPV: 7.4 fL (ref 6.0–10.0)
Monocytes Absolute: 0.3 10*3/uL (ref 0.1–1.7)
Monocytes: 5.9 % (ref 3.0–15.0)
Neutrophils %: 59.6 % (ref 42.0–78.0)
Neutrophils Absolute: 3.4 10*3/uL (ref 1.7–8.6)
PLT CT: 258 10*3/uL (ref 130–440)
RBC: 3.3 10*6/uL — ABNORMAL LOW (ref 3.80–5.00)
RDW: 15.3 % — ABNORMAL HIGH (ref 10.5–14.5)
WBC: 5.8 10*3/uL (ref 4.0–11.0)

## 2022-07-24 LAB — VITAMIN D,25 OH,TOTAL: Vitamin D 25-Hydroxy: 36 ng/mL (ref 30–80)

## 2022-07-24 LAB — VITAMIN B12: Vitamin B-12: 1289 pg/mL — ABNORMAL HIGH (ref 213–816)

## 2022-07-31 ENCOUNTER — Ambulatory Visit
Admission: RE | Admit: 2022-07-31 | Discharge: 2022-07-31 | Disposition: A | Discharge status: Home / Self Care | Payer: Self-pay | Source: Ambulatory Visit | Attending: Nurse Practitioner | Admitting: Nurse Practitioner

## 2022-07-31 LAB — BASIC METABOLIC PANEL
Anion Gap: 11.6 mMol/L (ref 7.0–18.0)
BUN / Creatinine Ratio: 27.3 Ratio (ref 10.0–30.0)
BUN: 18 mg/dL (ref 7–22)
CO2: 23 mMol/L (ref 20–30)
Calcium: 8.8 mg/dL (ref 8.5–10.5)
Chloride: 112 mMol/L — ABNORMAL HIGH (ref 98–110)
Creatinine: 0.66 mg/dL (ref 0.60–1.20)
EGFR: 100 mL/min/{1.73_m2} (ref 60–150)
Glucose: 88 mg/dL (ref 71–99)
Osmolality Calculated: 286 mOsm/kg (ref 275–300)
Potassium: 3.6 mMol/L (ref 3.5–5.3)
Sodium: 143 mMol/L (ref 136–147)

## 2022-07-31 LAB — CBC AND DIFFERENTIAL
Basophils %: 0.8 % (ref 0.0–3.0)
Basophils Absolute: 0 10*3/uL (ref 0.0–0.3)
Eosinophils %: 5 % (ref 0.0–7.0)
Eosinophils Absolute: 0.3 10*3/uL (ref 0.0–0.8)
Hematocrit: 33.7 % — ABNORMAL LOW (ref 36.0–48.0)
Hemoglobin: 10.6 gm/dL — ABNORMAL LOW (ref 12.0–16.0)
Lymphocytes Absolute: 1.7 10*3/uL (ref 0.6–5.1)
Lymphocytes: 30.7 % (ref 15.0–46.0)
MCH: 31 pg (ref 28–35)
MCHC: 31 gm/dL (ref 31–36)
MCV: 98 fL (ref 80–100)
MPV: 7.5 fL (ref 6.0–10.0)
Monocytes Absolute: 0.3 10*3/uL (ref 0.1–1.7)
Monocytes: 5 % (ref 3.0–15.0)
Neutrophils %: 58.6 % (ref 42.0–78.0)
Neutrophils Absolute: 3.3 10*3/uL (ref 1.7–8.6)
PLT CT: 286 10*3/uL (ref 130–440)
RBC: 3.43 10*6/uL — ABNORMAL LOW (ref 3.80–5.00)
RDW: 14.7 % — ABNORMAL HIGH (ref 10.5–14.5)
WBC: 5.7 10*3/uL (ref 4.0–11.0)

## 2022-08-07 ENCOUNTER — Ambulatory Visit
Admission: RE | Admit: 2022-08-07 | Discharge: 2022-08-07 | Disposition: A | Discharge status: Home / Self Care | Payer: Self-pay | Source: Ambulatory Visit | Attending: Nurse Practitioner | Admitting: Nurse Practitioner

## 2022-08-07 LAB — CBC AND DIFFERENTIAL
Basophils %: 0.3 % (ref 0.0–3.0)
Basophils Absolute: 0 10*3/uL (ref 0.0–0.3)
Eosinophils %: 2.3 % (ref 0.0–7.0)
Eosinophils Absolute: 0.3 10*3/uL (ref 0.0–0.8)
Hematocrit: 40 % (ref 36.0–48.0)
Hemoglobin: 12.4 gm/dL (ref 12.0–16.0)
Lymphocytes Absolute: 1.4 10*3/uL (ref 0.6–5.1)
Lymphocytes: 12.6 % — ABNORMAL LOW (ref 15.0–46.0)
MCH: 31 pg (ref 28–35)
MCHC: 31 gm/dL (ref 31–36)
MCV: 101 fL — ABNORMAL HIGH (ref 80–100)
MPV: 8.3 fL (ref 6.0–10.0)
Monocytes Absolute: 0.3 10*3/uL (ref 0.1–1.7)
Monocytes: 2.9 % — ABNORMAL LOW (ref 3.0–15.0)
Neutrophils %: 81.9 % — ABNORMAL HIGH (ref 42.0–78.0)
Neutrophils Absolute: 9 10*3/uL — ABNORMAL HIGH (ref 1.7–8.6)
PLT CT: 324 10*3/uL (ref 130–440)
RBC: 3.97 10*6/uL (ref 3.80–5.00)
RDW: 14.9 % — ABNORMAL HIGH (ref 10.5–14.5)
WBC: 11.1 10*3/uL — ABNORMAL HIGH (ref 4.0–11.0)

## 2022-08-07 LAB — BASIC METABOLIC PANEL
Anion Gap: 14.4 mMol/L (ref 7.0–18.0)
BUN / Creatinine Ratio: 20.5 Ratio (ref 10.0–30.0)
BUN: 16 mg/dL (ref 7–22)
CO2: 23 mMol/L (ref 20–30)
Calcium: 9.3 mg/dL (ref 8.5–10.5)
Chloride: 110 mMol/L (ref 98–110)
Creatinine: 0.78 mg/dL (ref 0.60–1.20)
EGFR: 86 mL/min/{1.73_m2} (ref 60–150)
Glucose: 84 mg/dL (ref 71–99)
Osmolality Calculated: 287 mOsm/kg (ref 275–300)
Potassium: 3.4 mMol/L — ABNORMAL LOW (ref 3.5–5.3)
Sodium: 144 mMol/L (ref 136–147)

## 2022-08-08 ENCOUNTER — Telehealth (INDEPENDENT_AMBULATORY_CARE_PROVIDER_SITE_OTHER): Payer: Self-pay

## 2022-08-08 NOTE — Telephone Encounter (Addendum)
Ms Laura Mclaughlin a nurse Tel: [phone]  from a rehab center in [place] called that patient will be going home and is requesting oxycodone for home, which NP is unable to authorized.  They wanted to find out if our provider can order for her oxycodone for home.      Advise gotten from the team to the nurse at rehab: patient is 6 weeks post op and should be wean off oxycodone.  Nurse advised to wean patient off from oxycodone and tramadol recommended, which NP can authorize.  Nurse voiced understanding and will relay message to the NP and patient.

## 2022-08-16 ENCOUNTER — Ambulatory Visit (INDEPENDENT_AMBULATORY_CARE_PROVIDER_SITE_OTHER): Payer: Medicare Other | Admitting: Student in an Organized Health Care Education/Training Program

## 2022-08-16 ENCOUNTER — Ambulatory Visit (INDEPENDENT_AMBULATORY_CARE_PROVIDER_SITE_OTHER): Payer: Medicare Other

## 2022-08-16 ENCOUNTER — Encounter (INDEPENDENT_AMBULATORY_CARE_PROVIDER_SITE_OTHER): Payer: Self-pay | Admitting: Student in an Organized Health Care Education/Training Program

## 2022-08-16 VITALS — BP 109/64 | HR 81

## 2022-08-16 DIAGNOSIS — S8992XD Unspecified injury of left lower leg, subsequent encounter: Secondary | ICD-10-CM

## 2022-08-16 DIAGNOSIS — S72402A Unspecified fracture of lower end of left femur, initial encounter for closed fracture: Secondary | ICD-10-CM

## 2022-08-16 DIAGNOSIS — S52571A Other intraarticular fracture of lower end of right radius, initial encounter for closed fracture: Secondary | ICD-10-CM
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
## 2022-08-27 ENCOUNTER — Ambulatory Visit
Admission: RE | Admit: 2022-08-27 | Discharge: 2022-08-27 | Disposition: A | Discharge status: Home / Self Care | Payer: Self-pay | Source: Ambulatory Visit

## 2022-08-27 ENCOUNTER — Telehealth: Payer: Self-pay

## 2022-08-27 ENCOUNTER — Encounter: Payer: Self-pay | Admitting: Family

## 2022-08-27 ENCOUNTER — Ambulatory Visit
Admission: RE | Admit: 2022-08-27 | Discharge: 2022-08-27 | Disposition: A | Discharge status: Home / Self Care | Payer: Medicare Other | Source: Ambulatory Visit | Attending: Family | Admitting: Family

## 2022-08-27 DIAGNOSIS — S12190S Other displaced fracture of second cervical vertebra, sequela: Secondary | ICD-10-CM

## 2022-08-27 DIAGNOSIS — S12190D Other displaced fracture of second cervical vertebra, subsequent encounter for fracture with routine healing: Secondary | ICD-10-CM | POA: Insufficient documentation

## 2022-08-27 DIAGNOSIS — M4802 Spinal stenosis, cervical region: Secondary | ICD-10-CM | POA: Insufficient documentation

## 2022-08-27 NOTE — Telephone Encounter (Signed)
Contacted patient to confirm appt with XR C-spine.      Patient stated XR C-spine will be obtained today, 08/27/22 at an outside [place] [place] facility. Patient was advised to bring disc and report to appointment.      Patient verbalized understanding, stated no further questions or concerns.      3:32 PM  08/27/22  Laura Bradley, LPN

## 2022-08-28 ENCOUNTER — Encounter: Payer: Self-pay | Admitting: Family

## 2022-08-28 ENCOUNTER — Other Ambulatory Visit: Payer: Self-pay

## 2022-08-28 ENCOUNTER — Ambulatory Visit: Payer: Medicare Other | Attending: Family | Admitting: Family

## 2022-08-28 VITALS — BP 104/70 | HR 75 | Ht 69.0 in | Wt 220.0 lb

## 2022-08-28 DIAGNOSIS — S12190S Other displaced fracture of second cervical vertebra, sequela: Secondary | ICD-10-CM | POA: Insufficient documentation

## 2022-08-28 DIAGNOSIS — S12190A Other displaced fracture of second cervical vertebra, initial encounter for closed fracture: Secondary | ICD-10-CM
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
## 2022-08-31 ENCOUNTER — Other Ambulatory Visit (INDEPENDENT_AMBULATORY_CARE_PROVIDER_SITE_OTHER): Payer: Self-pay | Admitting: Student in an Organized Health Care Education/Training Program

## 2022-08-31 ENCOUNTER — Encounter: Payer: Self-pay | Admitting: Student in an Organized Health Care Education/Training Program

## 2022-08-31 ENCOUNTER — Telehealth (INDEPENDENT_AMBULATORY_CARE_PROVIDER_SITE_OTHER): Payer: Self-pay

## 2022-08-31 DIAGNOSIS — S72402A Unspecified fracture of lower end of left femur, initial encounter for closed fracture: Secondary | ICD-10-CM

## 2022-09-03 ENCOUNTER — Encounter (INDEPENDENT_AMBULATORY_CARE_PROVIDER_SITE_OTHER): Payer: Self-pay

## 2022-09-03 ENCOUNTER — Ambulatory Visit (INDEPENDENT_AMBULATORY_CARE_PROVIDER_SITE_OTHER): Payer: BLUE CROSS/BLUE SHIELD

## 2022-09-03 NOTE — PSS Phone Screening (Signed)
Pre-Anesthesia Evaluation    Pre-op phone visit requested by:   Reason for pre-op phone visit: Patient anticipating LEFT DISTAL FEMUR NONUNION REPAIR, NO GRAFT, LEFT LOWER EXTREMITY HARDWARE REMOVAL procedure.    Language Assistant  Interpreter: N/A - English is preferred language    No orders of the defined types were placed in this encounter.    Neurology [facility] telemedicine encounter 03/13/22 (CE)  TSH 3.840 on 10/09/21 (CE)    History of Present Illness/Summary:    Problem List:  Medical Problems       Hospital Problem List  Date Reviewed: 08/30/2022   None        Non-Hospital Problem List  Date Reviewed: 08/30/2022            ICD-10-CM Priority Class Noted    Seizure R56.9   05/30/2021    Increasing frequency of seizure activity R56.9   06/01/2021    Liver laceration, major, initial encounter S36.116A   06/09/2022    Closed fracture of left distal femur S72.402A   06/09/2022    Tibial plateau fracture, right, closed, initial encounter S82.141A   06/11/2022    Traumatic hematoma of abdominal wall, initial encounter S30.1XXA   06/11/2022    Closed displaced fracture of second cervical vertebra S12.100A   06/11/2022    Trauma shock, initial encounter T79.4XXA   06/11/2022    Other intraarticular fracture of lower end of right radius, initial encounter for closed fracture S52.571A   06/11/2022        Medical History   Diagnosis Date    Closed fracture of distal end of left femur, unspecified fracture morphology, initial encounter     pre-op dx    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    Depression     Hypothyroidism     Lupus     Restless leg syndrome      Past Surgical History:   Procedure Laterality Date    APPLICATION, EXTERNAL FIXATOR, UPPER EXTREMITY Right 06/14/2022    Procedure: APPLICATION, EXTERNAL FIXATOR, UPPER EXTREMITY;  Surgeon: Timoteo Ace, MD;  Location: [facility];  Service: Orthopedics;  Laterality: Right;    GASTRIC BYPASS N/A     HERNIA REPAIR      ORIF,  FEMUR, DISTAL Left 06/11/2022    Procedure: ORIF, FEMUR, DISTAL;  Surgeon: Lucrezia Europe, MD;  Location: [place] [facility];  Service: Orthopedics;  Laterality: Left;    ORIF, RADIUS, DISTAL (WRIST) Right 06/26/2022    Procedure: ORIF, RIGHT RADIUS, DISTAL (WRIST);  Surgeon: Amado Nash, MD;  Location: [facility];  Service: Orthopedics;  Laterality: Right;    ORIF, TIBIA PLATEAU Right 06/14/2022    Procedure: ORIF, TIBIA PLATEAU RIGHT;  Surgeon: Timoteo Ace, MD;  Location: [place] [facility];  Service: Orthopedics;  Laterality: Right;    REMOVE, MODIFY, UPPER EXTREMITY EXTERNAL FIXATOR Right 06/26/2022    Procedure: REMOVAL EXTERNAL FIXATOR;  Surgeon: Amado Nash, MD;  Location: [facility];  Service: Orthopedics;  Laterality: Right;        Medication List            Accurate as of September 03, 2022 11:59 AM. Always use your most recent med list.                acetaminophen 500 MG tablet  Take 2 tablets (1,000 mg) by mouth every 8 (eight) hours  Commonly known as: TYLENOL  Medication Adjustments for Surgery: Take as prescribed     b complex-vitamin c-folic acid 0.8 MG Tabs  Take 1 tablet by mouth daily  Medication Adjustments for Surgery: Hold day of surgery     bisacodyl 10 mg suppository  Place 1 suppository (10 mg) rectally daily as needed for Constipation  Commonly known as: DULCOLAX  Medication Adjustments for Surgery: Hold day of surgery     Briviact 50 MG Tabs tablet  Take 1 tablet (50 mg) by mouth every 12 (twelve) hours  Generic drug: Brivaracetam  Notes to patient: Pt states she no longer takes this     calcium citrate-vitamin D 315-6.25 MG-MCG Tabs  Take 1 tablet by mouth 3 (three) times daily  Commonly known as: CITRACAL+D  Medication Adjustments for Surgery: Hold day of surgery     cloBAZam 20 MG Tabs  Take 1 tablet (20 mg) by mouth nightly  Medication Adjustments for Surgery: Take as prescribed     Desvenlafaxine ER 100 MG Tb24  Take 1 tablet (100 mg) by mouth daily  Medication  Adjustments for Surgery: Take as prescribed     diazePAM 5 MG tablet  Take 2 tablets (10 mg) by mouth nightly  Commonly known as: VALIUM  Notes to patient: Pt states she no longer takes this     gabapentin 300 MG capsule  Take 2 capsules (600 mg) by mouth every 8 (eight) hours  Commonly known as: NEURONTIN  Medication Adjustments for Surgery: Take as prescribed     lamoTRIgine 150 MG tablet  Take 2 tablets (300 mg) by mouth daily  Commonly known as: LaMICtal  Medication Adjustments for Surgery: Take as prescribed     levothyroxine 75 MCG tablet  Take 1 tablet (75 mcg) by mouth Once a day at 6:00am  Commonly known as: SYNTHROID  Medication Adjustments for Surgery: Take as prescribed     Meloxicam 7.5 MG Tbdp  Take 1 tablet (7.5 mg) by mouth daily  Medication Adjustments for Surgery: Stop now  Notes to patient: Pt states she hasn't taken this in a week     Methocarbamol 1000 MG Tabs  Take 1 tablet (1,000 mg) by mouth 4 (four) times daily  Medication Adjustments for Surgery: Take as prescribed     ondansetron 4 MG disintegrating tablet  Take 1 tablet (4 mg) by mouth every 8 (eight) hours as needed for Nausea  Commonly known as: ZOFRAN-ODT  Medication Adjustments for Surgery: Take as prescribed     pantoprazole 40 MG tablet  Take 1 tablet (40 mg) by mouth 2 (two) times daily  Commonly known as: PROTONIX  Medication Adjustments for Surgery: Take as prescribed     Rotigotine 3 MG/24HR Pt24  Place 1 patch (3 mg) onto the skin every 24 hours  Medication Adjustments for Surgery: Take as prescribed     senna-docusate 8.6-50 MG per tablet  Take 1 tablet by mouth every 12 (twelve) hours  Commonly known as: PERICOLACE  Medication Adjustments for Surgery: Hold day of surgery     * topiramate 50 MG tablet  Take 3 tablets (150 mg) by mouth every 12 (twelve) hours  Commonly known as: TOPAMAX  Medication Adjustments for Surgery: Take as prescribed     * topiramate 50 MG tablet  Take 1 tablet (50 mg) by mouth 2 (two) times  daily  Commonly known as: TOPAMAX  Notes to patient: Duplicate      vitamin B-12 1000 MCG tablet  Take 1 tablet (1,000 mcg) by  mouth every 14 (fourteen) days  Commonly known as: CYANOCOBALAMIN  Medication Adjustments for Surgery: Hold day of surgery     vitamin D (ergocalciferol) 50000 UNIT Caps  Take 1 capsule (50,000 Units) by mouth once a week  Commonly known as: DRISDOL  Medication Adjustments for Surgery: Hold day of surgery     vitamins/minerals Tabs  Take 1 tablet by mouth 2 (two) times daily  Medication Adjustments for Surgery: Hold day of surgery           * This list has 2 medication(s) that are the same as other medications prescribed for you. Read the directions carefully, and ask your doctor or other care provider to review them with you.                Allergies   Allergen Reactions    Dilaudid [Hydromorphone] Irregular Heart Rate    Reglan [Metoclopramide] Other (See Comments)     Tardive dyskinesia    Reglan [Metoclopramide] Other (See Comments)     Tardive dyskinesia      History reviewed. No pertinent family history.  Social History     Occupational History    Not on file   Tobacco Use    Smoking status: Never    Smokeless tobacco: Never   Vaping Use    Vaping Use: Never used   Substance and Sexual Activity    Alcohol use: Never    Drug use: Never    Sexual activity: Not on file       Menstrual History:   LMP / Status  Postmenopausal     No LMP recorded. Patient is postmenopausal.    Tubal Ligation?  No valid surgical or medical questions entered.           Exam Scores:   SDB score  Risk Category: Confirmed No OSA    STBUR score       PONV score  Nausea Risk: VERY SEVERE RISK    MST score  MST Score: 0    Allergy score       Frailty score            Visit Vitals  Ht 1.753 m (5\' 9" )   Wt 108.9 kg (240 lb)   BMI 35.44 kg/m       Recent Labs   CBC (last 180 days) 06/26/22  0356 06/27/22  1251 07/17/22  0630 07/31/22  0635 08/07/22  0636   WBC 7.11 9.06   < > 5.7 11.1*   RBC 2.85* 3.19*   < > 3.43*  3.97   Hemoglobin  --   --    < > 10.6* 12.4   Hgb 8.5* 9.4*  --   --   --    Hematocrit 27.5* 31.0*   < > 33.7* 40.0   MCV 96.5* 97.2*   < > 98 101*   MCH 29.8 29.5   < > 31 31   MCHC 30.9* 30.3*   < > 31 31   RDW 17* 18*   < > 14.7* 14.9*   Platelets 511* 529*  --   --   --    PLT CT  --   --    < > 286 324   MPV 9.7 9.5   < > 7.5 8.3   Nucleated RBC 0.0 0.0  --   --   --    Absolute NRBC 0.00 0.00  --   --   --     < > =  values in this interval not displayed.     Recent Labs   BMP (last 180 days) 07/31/22  0635 08/07/22  0636   Glucose 88 84   BUN 18 16   Creatinine 0.66 0.78   Sodium 143 144   Potassium 3.6 3.4*   Chloride 112* 110   CO2 23 23   Calcium 8.8 9.3   Anion Gap 11.6 14.4   EGFR 100 86     Recent Labs   Coag Panel (last 180 days) 06/09/22  1142 06/09/22  1736   PTT  --  28   PT 10.9 13.5*   PT INR 1.0 1.2*     Recent Labs   Other (last 180 days) 06/09/22  1142 06/10/22  0107 07/17/22  0630 07/24/22  0605   Bilirubin, Total 0.5  --   --   --    ALT 33  --   --   --    AST (SGOT) 45*  --   --   --    Protein, Total 6.6  --   --   --    Vitamin B-12  --   --  825* 1,289*   Culture MRSA Surveillance  --  Negative for Methicillin Resistant Staph aureus  Negative for Methicillin Resistant Staph aureus  --   --

## 2022-09-05 ENCOUNTER — Encounter
Admission: RE | Disposition: A | Discharge status: Rehabilitation Facility | Payer: Self-pay | Source: Home / Self Care | Attending: Student in an Organized Health Care Education/Training Program

## 2022-09-05 ENCOUNTER — Inpatient Hospital Stay
Admission: RE | Admit: 2022-09-05 | Discharge: 2022-09-08 | DRG: 481 | Disposition: A | Discharge status: Rehabilitation Facility | Payer: Medicare Other | Attending: Student in an Organized Health Care Education/Training Program | Admitting: Student in an Organized Health Care Education/Training Program

## 2022-09-05 ENCOUNTER — Ambulatory Visit: Payer: Medicare Other | Admitting: Anesthesiology

## 2022-09-05 ENCOUNTER — Inpatient Hospital Stay: Payer: Medicare Other

## 2022-09-05 ENCOUNTER — Ambulatory Visit: Payer: Medicare Other

## 2022-09-05 DIAGNOSIS — G40409 Other generalized epilepsy and epileptic syndromes, not intractable, without status epilepticus: Secondary | ICD-10-CM | POA: Diagnosis present

## 2022-09-05 DIAGNOSIS — R509 Fever, unspecified: Secondary | ICD-10-CM | POA: Diagnosis not present

## 2022-09-05 DIAGNOSIS — S72422K Displaced fracture of lateral condyle of left femur, subsequent encounter for closed fracture with nonunion: Principal | ICD-10-CM

## 2022-09-05 DIAGNOSIS — S72402A Unspecified fracture of lower end of left femur, initial encounter for closed fracture: Principal | ICD-10-CM | POA: Insufficient documentation

## 2022-09-05 DIAGNOSIS — X58XXXD Exposure to other specified factors, subsequent encounter: Secondary | ICD-10-CM | POA: Diagnosis present

## 2022-09-05 DIAGNOSIS — S72402K Unspecified fracture of lower end of left femur, subsequent encounter for closed fracture with nonunion: Secondary | ICD-10-CM

## 2022-09-05 DIAGNOSIS — G2581 Restless legs syndrome: Secondary | ICD-10-CM | POA: Diagnosis present

## 2022-09-05 DIAGNOSIS — D62 Acute posthemorrhagic anemia: Secondary | ICD-10-CM | POA: Diagnosis not present

## 2022-09-05 DIAGNOSIS — E039 Hypothyroidism, unspecified: Secondary | ICD-10-CM | POA: Diagnosis present

## 2022-09-05 DIAGNOSIS — G8918 Other acute postprocedural pain: Secondary | ICD-10-CM | POA: Diagnosis present

## 2022-09-05 DIAGNOSIS — S72432K Displaced fracture of medial condyle of left femur, subsequent encounter for closed fracture with nonunion: Secondary | ICD-10-CM | POA: Diagnosis present

## 2022-09-05 HISTORY — PX: REPAIR, NONUNION, FEMUR, NO GRAFT: SHX50043

## 2022-09-05 HISTORY — PX: REMOVAL, HARDWARE, COMPLEX, NON-SPINE: SHX5120

## 2022-09-05 LAB — CBC AND DIFFERENTIAL
Absolute NRBC: 0 10*3/uL (ref 0.00–0.00)
Basophils Absolute Automated: 0.02 10*3/uL (ref 0.00–0.08)
Basophils Automated: 0.2 %
Eosinophils Absolute Automated: 0.01 10*3/uL (ref 0.00–0.44)
Eosinophils Automated: 0.1 %
Hematocrit: 26.9 % — ABNORMAL LOW (ref 34.7–43.7)
Hgb: 8.4 g/dL — ABNORMAL LOW (ref 11.4–14.8)
Immature Granulocytes Absolute: 0.07 10*3/uL (ref 0.00–0.07)
Immature Granulocytes: 0.5 %
Instrument Absolute Neutrophil Count: 12 10*3/uL — ABNORMAL HIGH (ref 1.10–6.33)
Lymphocytes Absolute Automated: 0.96 10*3/uL (ref 0.42–3.22)
Lymphocytes Automated: 7.2 %
MCH: 29.5 pg (ref 25.1–33.5)
MCHC: 31.2 g/dL — ABNORMAL LOW (ref 31.5–35.8)
MCV: 94.4 fL (ref 78.0–96.0)
MPV: 10.5 fL (ref 8.9–12.5)
Monocytes Absolute Automated: 0.21 10*3/uL (ref 0.21–0.85)
Monocytes: 1.6 %
Neutrophils Absolute: 12 10*3/uL — ABNORMAL HIGH (ref 1.10–6.33)
Neutrophils: 90.4 %
Nucleated RBC: 0 /100 WBC (ref 0.0–0.0)
Platelets: 242 10*3/uL (ref 142–346)
RBC: 2.85 10*6/uL — ABNORMAL LOW (ref 3.90–5.10)
RDW: 14 % (ref 11–15)
WBC: 13.27 10*3/uL — ABNORMAL HIGH (ref 3.10–9.50)

## 2022-09-05 LAB — ANTIBODY IDENTIFICATION - REFLEX
Antibody ID: POSITIVE
Antibody ID: POSITIVE

## 2022-09-05 LAB — CBC
Absolute NRBC: 0 10*3/uL (ref 0.00–0.00)
Hematocrit: 28.5 % — ABNORMAL LOW (ref 34.7–43.7)
Hgb: 9.1 g/dL — ABNORMAL LOW (ref 11.4–14.8)
MCH: 29.6 pg (ref 25.1–33.5)
MCHC: 31.9 g/dL (ref 31.5–35.8)
MCV: 92.8 fL (ref 78.0–96.0)
MPV: 10.5 fL (ref 8.9–12.5)
Nucleated RBC: 0 /100 WBC (ref 0.0–0.0)
Platelets: 200 10*3/uL (ref 142–346)
RBC: 3.07 10*6/uL — ABNORMAL LOW (ref 3.90–5.10)
RDW: 14 % (ref 11–15)
WBC: 7.09 10*3/uL (ref 3.10–9.50)

## 2022-09-05 LAB — TYPE AND SCREEN
AB Screen Gel: POSITIVE
ABO Rh: A NEG

## 2022-09-05 LAB — PT/INR
PT INR: 1.2 — ABNORMAL HIGH (ref 0.9–1.1)
PT: 13.8 s — ABNORMAL HIGH (ref 10.1–12.9)

## 2022-09-05 SURGERY — REPAIR, NONUNION, FEMUR, NO GRAFT
Anesthesia: Anesthesia General | Site: Leg Upper | Laterality: Left | Wound class: Clean

## 2022-09-05 MED ORDER — FENTANYL CITRATE (PF) 50 MCG/ML IJ SOLN (WRAP)
INTRAMUSCULAR | Status: AC
Start: 2022-09-05 — End: 2022-09-05
  Administered 2022-09-05: 25 ug via INTRAVENOUS
  Filled 2022-09-05: qty 2

## 2022-09-05 MED ORDER — PROPOFOL 10 MG/ML IV EMUL (WRAP)
INTRAVENOUS | Status: AC
Start: 2022-09-05 — End: ?
  Filled 2022-09-05: qty 20

## 2022-09-05 MED ORDER — FENTANYL CITRATE (PF) 50 MCG/ML IJ SOLN (WRAP)
INTRAMUSCULAR | Status: AC
Start: 2022-09-05 — End: ?
  Filled 2022-09-05: qty 2

## 2022-09-05 MED ORDER — ROCURONIUM BROMIDE 50 MG/5ML IV SOLN
INTRAVENOUS | Status: AC
Start: 2022-09-05 — End: ?
  Filled 2022-09-05: qty 5

## 2022-09-05 MED ORDER — OXYCODONE HCL 5 MG PO TABS
ORAL_TABLET | ORAL | Status: AC
Start: 2022-09-05 — End: 2022-09-05
  Administered 2022-09-05: 5 mg via ORAL
  Filled 2022-09-05: qty 1

## 2022-09-05 MED ORDER — MIDAZOLAM HCL 1 MG/ML IJ SOLN (WRAP)
INTRAMUSCULAR | Status: AC
Start: 2022-09-05 — End: ?
  Filled 2022-09-05: qty 2

## 2022-09-05 MED ORDER — DEXAMETHASONE SODIUM PHOSPHATE 4 MG/ML IJ SOLN (WRAP)
INTRAMUSCULAR | Status: DC | PRN
Start: 2022-09-05 — End: 2022-09-05
  Administered 2022-09-05: 8 mg via INTRAVENOUS

## 2022-09-05 MED ORDER — GABAPENTIN 300 MG PO CAPS
ORAL_CAPSULE | ORAL | Status: AC
Start: 2022-09-05 — End: 2022-09-05
  Administered 2022-09-05: 300 mg via ORAL
  Filled 2022-09-05: qty 1

## 2022-09-05 MED ORDER — DEXAMETHASONE SODIUM PHOSPHATE 20 MG/5ML IJ SOLN
INTRAMUSCULAR | Status: AC
Start: 2022-09-05 — End: ?
  Filled 2022-09-05: qty 5

## 2022-09-05 MED ORDER — SENNOSIDES-DOCUSATE SODIUM 8.6-50 MG PO TABS
2.0000 | ORAL_TABLET | Freq: Two times a day (BID) | ORAL | Status: DC
Start: 2022-09-05 — End: 2022-09-08
  Administered 2022-09-06 – 2022-09-08 (×5): 2 via ORAL
  Filled 2022-09-05 (×5): qty 2

## 2022-09-05 MED ORDER — GABAPENTIN 300 MG PO CAPS
300.0000 mg | ORAL_CAPSULE | Freq: Three times a day (TID) | ORAL | Status: DC
Start: 2022-09-05 — End: 2022-09-06
  Administered 2022-09-05 – 2022-09-06 (×2): 300 mg via ORAL
  Filled 2022-09-05 (×2): qty 1

## 2022-09-05 MED ORDER — TRANEXAMIC ACID-NACL 1000-0.7 MG/100ML-% IV SOLN (NARRATOR USE ONLY)
INTRAVENOUS | Status: DC | PRN
Start: 2022-09-05 — End: 2022-09-05
  Administered 2022-09-05 (×2): 1000 mg via INTRAVENOUS

## 2022-09-05 MED ORDER — ONDANSETRON HCL 4 MG/2ML IJ SOLN
4.0000 mg | Freq: Once | INTRAMUSCULAR | Status: AC | PRN
Start: 2022-09-05 — End: 2022-09-05

## 2022-09-05 MED ORDER — ROPIVACAINE HCL 5 MG/ML IJ SOLN
Freq: Once | INTRAMUSCULAR | Status: DC | PRN
Start: 2022-09-05 — End: 2022-09-08
  Filled 2022-09-05: qty 30

## 2022-09-05 MED ORDER — ACETAMINOPHEN 500 MG PO TABS
1000.0000 mg | ORAL_TABLET | Freq: Once | ORAL | Status: DC | PRN
Start: 2022-09-05 — End: 2022-09-05

## 2022-09-05 MED ORDER — SODIUM CHLORIDE 0.9 % IR SOLN
Status: DC | PRN
Start: 2022-09-05 — End: 2022-09-05
  Administered 2022-09-05: 1000 mL

## 2022-09-05 MED ORDER — SUGAMMADEX SODIUM 200 MG/2ML IV SOLN
INTRAVENOUS | Status: DC | PRN
Start: 2022-09-05 — End: 2022-09-05
  Administered 2022-09-05: 500 mg via INTRAVENOUS

## 2022-09-05 MED ORDER — OXYCODONE HCL 5 MG PO TABS
5.0000 mg | ORAL_TABLET | ORAL | Status: DC | PRN
Start: 2022-09-05 — End: 2022-09-08

## 2022-09-05 MED ORDER — ONDANSETRON HCL 4 MG/2ML IJ SOLN
INTRAMUSCULAR | Status: AC
Start: 2022-09-05 — End: 2022-09-05
  Administered 2022-09-05: 4 mg via INTRAVENOUS
  Filled 2022-09-05: qty 2

## 2022-09-05 MED ORDER — ROPIVACAINE HCL 5 MG/ML IJ SOLN
INTRAMUSCULAR | Status: AC
Start: 2022-09-05 — End: ?
  Filled 2022-09-05: qty 30

## 2022-09-05 MED ORDER — LACTATED RINGERS IV SOLN
INTRAVENOUS | Status: DC | PRN
Start: 2022-09-05 — End: 2022-09-05

## 2022-09-05 MED ORDER — LIDOCAINE HCL (PF) 1 % IJ SOLN
INTRAMUSCULAR | Status: DC | PRN
Start: 2022-09-05 — End: 2022-09-05
  Administered 2022-09-05: 20 mg via INTRAVENOUS

## 2022-09-05 MED ORDER — PHENYLEPHRINE 100 MCG/ML IV SOSY (WRAP)
PREFILLED_SYRINGE | INTRAVENOUS | Status: AC
Start: 2022-09-05 — End: ?
  Filled 2022-09-05: qty 10

## 2022-09-05 MED ORDER — NALOXONE HCL 0.4 MG/ML IJ SOLN (WRAP)
0.4000 mg | INTRAMUSCULAR | Status: DC | PRN
Start: 2022-09-05 — End: 2022-09-08

## 2022-09-05 MED ORDER — LIDOCAINE HCL 1 % IJ SOLN
1.0000 mL | Freq: Once | INTRAMUSCULAR | Status: DC | PRN
Start: 2022-09-05 — End: 2022-09-05

## 2022-09-05 MED ORDER — LIDOCAINE HCL (PF) 2 % IJ SOLN
INTRAMUSCULAR | Status: AC
Start: 2022-09-05 — End: ?
  Filled 2022-09-05: qty 5

## 2022-09-05 MED ORDER — ACETAMINOPHEN 500 MG PO TABS
ORAL_TABLET | ORAL | Status: AC
Start: 2022-09-05 — End: 2022-09-05
  Administered 2022-09-05: 1000 mg via ORAL
  Filled 2022-09-05: qty 2

## 2022-09-05 MED ORDER — SUGAMMADEX SODIUM 200 MG/2ML IV SOLN
INTRAVENOUS | Status: AC
Start: 2022-09-05 — End: ?
  Filled 2022-09-05: qty 2

## 2022-09-05 MED ORDER — HYDROMORPHONE HCL 0.5 MG/0.5 ML IJ SOLN
0.5000 mg | INTRAMUSCULAR | Status: DC | PRN
Start: 2022-09-05 — End: 2022-09-05

## 2022-09-05 MED ORDER — SODIUM CHLORIDE (PF) 0.9 % IJ SOLN
INTRAMUSCULAR | Status: AC
Start: 2022-09-05 — End: ?
  Filled 2022-09-05: qty 10

## 2022-09-05 MED ORDER — GLYCOPYRROLATE 0.2 MG/ML IJ SOLN (WRAP)
INTRAMUSCULAR | Status: AC
Start: 2022-09-05 — End: ?
  Filled 2022-09-05: qty 3

## 2022-09-05 MED ORDER — FENTANYL CITRATE (PF) 50 MCG/ML IJ SOLN (WRAP)
INTRAMUSCULAR | Status: AC
Start: 2022-09-05 — End: 2022-09-05
  Administered 2022-09-05: 50 ug via INTRAVENOUS
  Filled 2022-09-05: qty 2

## 2022-09-05 MED ORDER — ENOXAPARIN SODIUM 40 MG/0.4ML IJ SOSY
40.0000 mg | PREFILLED_SYRINGE | INTRAMUSCULAR | Status: DC
Start: 2022-09-05 — End: 2022-09-08
  Administered 2022-09-05 – 2022-09-08 (×4): 40 mg via SUBCUTANEOUS
  Filled 2022-09-05 (×4): qty 0.4

## 2022-09-05 MED ORDER — FENTANYL CITRATE (PF) 50 MCG/ML IJ SOLN (WRAP)
INTRAMUSCULAR | Status: DC | PRN
Start: 2022-09-05 — End: 2022-09-05
  Administered 2022-09-05: 100 ug via INTRAVENOUS
  Administered 2022-09-05 (×3): 50 ug via INTRAVENOUS

## 2022-09-05 MED ORDER — EPHEDRINE SULFATE 50 MG/ML IJ/IV SOLN (WRAP)
Status: DC | PRN
Start: 2022-09-05 — End: 2022-09-05
  Administered 2022-09-05 (×3): 10 mg via INTRAVENOUS

## 2022-09-05 MED ORDER — LIDOCAINE 4 % EX CREA
TOPICAL_CREAM | Freq: Once | CUTANEOUS | Status: DC | PRN
Start: 2022-09-05 — End: 2022-09-05

## 2022-09-05 MED ORDER — ONDANSETRON HCL 4 MG/2ML IJ SOLN
INTRAMUSCULAR | Status: DC | PRN
Start: 2022-09-05 — End: 2022-09-05
  Administered 2022-09-05: 4 mg via INTRAVENOUS

## 2022-09-05 MED ORDER — LACTATED RINGERS IV SOLN
INTRAVENOUS | Status: DC
Start: 2022-09-05 — End: 2022-09-08

## 2022-09-05 MED ORDER — CEFAZOLIN SODIUM 1 G IJ SOLR
INTRAMUSCULAR | Status: AC
Start: 2022-09-05 — End: ?
  Filled 2022-09-05: qty 2000

## 2022-09-05 MED ORDER — ROPIVACAINE HCL 5 MG/ML IJ SOLN
Freq: Once | INTRAMUSCULAR | Status: AC | PRN
Start: 2022-09-05 — End: 2022-09-05
  Administered 2022-09-05: 20 mL via PERINEURAL
  Filled 2022-09-05: qty 20

## 2022-09-05 MED ORDER — ALBUMIN HUMAN/BIOSIMILIAR 5% IV SOLN (WRAP)
INTRAVENOUS | Status: DC | PRN
Start: 2022-09-05 — End: 2022-09-05

## 2022-09-05 MED ORDER — PROPOFOL 10 MG/ML IV EMUL (WRAP)
INTRAVENOUS | Status: DC | PRN
Start: 2022-09-05 — End: 2022-09-05
  Administered 2022-09-05: 40 mg via INTRAVENOUS
  Administered 2022-09-05: 160 mg via INTRAVENOUS

## 2022-09-05 MED ORDER — MIDAZOLAM HCL 1 MG/ML IJ SOLN (WRAP)
INTRAMUSCULAR | Status: DC | PRN
Start: 2022-09-05 — End: 2022-09-05
  Administered 2022-09-05: 2 mg via INTRAVENOUS

## 2022-09-05 MED ORDER — STERILE WATER FOR INJECTION IJ/IV SOLN (WRAP)
2.0000 g | Freq: Three times a day (TID) | INTRAMUSCULAR | Status: AC
Start: 2022-09-05 — End: 2022-09-06
  Administered 2022-09-05 – 2022-09-06 (×2): 2 g via INTRAVENOUS
  Filled 2022-09-05 (×2): qty 2000

## 2022-09-05 MED ORDER — FENTANYL CITRATE (PF) 50 MCG/ML IJ SOLN (WRAP)
50.0000 ug | INTRAMUSCULAR | Status: DC | PRN
Start: 2022-09-05 — End: 2022-09-05
  Administered 2022-09-05: 50 ug via INTRAVENOUS

## 2022-09-05 MED ORDER — ACETAMINOPHEN 500 MG PO TABS
1000.0000 mg | ORAL_TABLET | Freq: Three times a day (TID) | ORAL | Status: DC
Start: 2022-09-05 — End: 2022-09-08
  Administered 2022-09-05 – 2022-09-08 (×9): 1000 mg via ORAL
  Filled 2022-09-05 (×9): qty 2

## 2022-09-05 MED ORDER — ALBUMIN HUMAN/BIOSIMILIAR 5% IV SOLN (WRAP)
INTRAVENOUS | Status: AC
Start: 2022-09-05 — End: ?
  Filled 2022-09-05: qty 500

## 2022-09-05 MED ORDER — ROCURONIUM BROMIDE 10 MG/ML IV SOLN (WRAP)
INTRAVENOUS | Status: DC | PRN
Start: 2022-09-05 — End: 2022-09-05
  Administered 2022-09-05: 50 mg via INTRAVENOUS
  Administered 2022-09-05 (×2): 20 mg via INTRAVENOUS
  Administered 2022-09-05: 5 mg via INTRAVENOUS
  Administered 2022-09-05: 30 mg via INTRAVENOUS

## 2022-09-05 MED ORDER — OXYCODONE HCL 5 MG PO TABS
5.0000 mg | ORAL_TABLET | Freq: Once | ORAL | Status: AC | PRN
Start: 2022-09-05 — End: 2022-09-05

## 2022-09-05 MED ORDER — EPHEDRINE SULFATE 50 MG/ML IJ/IV SOLN (WRAP)
Status: AC
Start: 2022-09-05 — End: ?
  Filled 2022-09-05: qty 1

## 2022-09-05 MED ORDER — ONDANSETRON HCL 4 MG/2ML IJ SOLN
INTRAMUSCULAR | Status: AC
Start: 2022-09-05 — End: ?
  Filled 2022-09-05: qty 2

## 2022-09-05 MED ORDER — PHENYLEPHRINE HCL 10 MG/ML IV SOLN (WRAP)
Status: DC | PRN
Start: 2022-09-05 — End: 2022-09-05
  Administered 2022-09-05: 100 ug via INTRAVENOUS

## 2022-09-05 MED ORDER — OXYCODONE HCL 5 MG PO TABS
10.0000 mg | ORAL_TABLET | ORAL | Status: DC | PRN
Start: 2022-09-05 — End: 2022-09-08
  Administered 2022-09-05 – 2022-09-08 (×17): 10 mg via ORAL
  Filled 2022-09-05 (×12): qty 2
  Filled 2022-09-05: qty 1
  Filled 2022-09-05 (×3): qty 2
  Filled 2022-09-05: qty 1
  Filled 2022-09-05: qty 2

## 2022-09-05 MED ORDER — ROPIVACAINE HCL 2 MG/ML IJ SOLN
INTRAMUSCULAR | Status: DC | PRN
Start: 2022-09-05 — End: 2022-09-05
  Administered 2022-09-05: 40 mL via PERINEURAL

## 2022-09-05 MED ORDER — CEFAZOLIN SODIUM 1 G IJ SOLR
INTRAMUSCULAR | Status: DC | PRN
Start: 2022-09-05 — End: 2022-09-05
  Administered 2022-09-05 (×2): 2 g via INTRAVENOUS

## 2022-09-05 SURGICAL SUPPLY — 104 items
APPLICATOR CHLORAPREP 26 ML 70% ISOPROPYL ALCOHOL 2% CHLORHEXIDINE (Applicator) ×3 IMPLANT
APPLICATOR PRP 70% ISPRP 2% CHG 26ML (Applicator) ×3
BAG EQP 28X36IN BAND (Other) ×2
BAG EQUIPMENT L28 IN X W36 IN BAND (Other) ×2 IMPLANT
BANDAGE CMPR PLSTR CTTN SFWRP 5YDX6IN LF (Procedure Accessories)
BANDAGE COMPRESSION L5 YD X W6 IN ELASTIC CLIP CLOSURE POLYESTER (Procedure Accessories) IMPLANT
BANDAGE GAUZE L3.6 YD X W3.4 IN 6 PLY ABSORBENT STRETCH TIGHT FINISH (Bandage) ×1 IMPLANT
BANDAGE GZE CTTN BLK2 3.6YDX3.4IN LF (Bandage) ×1
BANDAGE MEDLINE COMPRESSION L5 YD X W6 (Procedure Accessories)
BANDAGE MEDLINE GAUZE L3.6 YD X W3.4 IN (Bandage) ×1
BIT DRILL L185 MM OD4.2 MM FREEHAND (Drillbits) ×2
BIT DRILL L185 MM OD4.2 MM NA FREEHAND (Drillbits) ×2 IMPLANT
BIT DRL 4.2MM 185MM LF STRL FRHD REUSE (Drillbits) ×2
COVER FLEXIBLE LIGHT HANDLE PLASTIC GREEN (Procedure Accessories) ×3 IMPLANT
COVER FLEXIBLE MEDLINE LIGHT HANDLE (Procedure Accessories) ×3
COVER LGHT HNDL PLS LF STRL FLXB DISP (Procedure Accessories) ×3
DRAPE EQP C-ARMOR STRL XPD CLPSBL CARM (Drape) ×1
DRAPE EXPAND COLLAPSIBLE EQUIPMENT C-ARMOR C ARM FLUOROSCOPE STERILE (Drape) ×1 IMPLANT
DRAPE SRG PE STRDRP 17X11IN LF STRL ADH (Drape)
DRAPE SRG PLS U STRDRP 51X47IN LF STRL (Drape) ×1
DRAPE SRG SMS 108X77IN LF STRL ULT LO (Drape) ×2
DRAPE SRG TBRN XL CNVRT 98X77IN LF STRL (Drape) ×2
DRAPE SURGICAL ADHESIVE L51 IN X W47 IN (Drape) ×1
DRAPE SURGICAL ADHESIVE L51 IN X W47 IN STERI-DRAPE CLEAR (Drape) ×1 IMPLANT
DRAPE SURGICAL ADHESIVE STRIP SMALL TOWEL MATTE FINISH L17 IN X W11 IN (Drape) IMPLANT
DRAPE SURGICAL SHEET FANFOLD L98 IN X (Drape) ×2
DRAPE SURGICAL SHEET FANFOLD L98 IN X W77 IN TIBURON XL PINK (Drape) ×2 IMPLANT
DRAPE SURGICAL ULTRA LOW LINT PUNCTURE (Drape) ×2
DRAPE SURGICAL ULTRAGARD SPLIT L108 IN X W77 IN SMS (Drape) ×2 IMPLANT
DRESSING PETRO 3% BI 3BRM GZE XR 8X1IN (Dressing) ×1
DRESSING PETRO 3% BI 3BRM GZE XR 9X5IN (Dressing) ×1
DRESSING PETROLATUM L9 IN X W5 IN NONADHESIVE OCCLUSIVE BACTERIOSTATIC (Dressing) ×1 IMPLANT
DRESSING PETROLATUM XEROFORM L8 IN X W1 (Dressing) ×1
DRESSING PETROLATUM XEROFORM L8 IN X W1 IN 3% BISMUTH TRIBROMOPHENATE (Dressing) ×1 IMPLANT
DRESSING SCR CHG TGDRM 6 1/8INX4IN LF (Dressing) ×1
DRESSING SECUREMENT TEGADERM L6 1/8 IN X (Dressing) ×1
DRESSING SECUREMENT TEGADERM L6 1/8 IN X W4 IN INTRAVENOUS (Dressing) ×1 IMPLANT
DRESSING TRANSPARENT L4 3/4 IN X W4 IN (Dressing) ×6
DRESSING TRANSPARENT L4 3/4 IN X W4 IN POLYURETHANE ADHESIVE (Dressing) ×6 IMPLANT
DRESSING TRNS PU STD TGDRM 4.75X4IN LF (Dressing) ×6
DRILL SRG 4.2MM 360MM STRL LCK (Drillbits) ×1
DRILL SURGICAL L360 MM LOCK OD4.2 MM (Drillbits) ×1
DRILL SURGICAL L360 MM LOCK OD4.2 MM NA (Drillbits) ×1 IMPLANT
ELECTRODE ADULT PATIENT RETURN L9 FT REM POLYHESIVE ACRYLIC FOAM (Procedure Accessories) ×1 IMPLANT
ELECTRODE PATIENT RETURN L9 FT VALLEYLAB (Procedure Accessories) ×1
ELECTRODE PT RTN RM PHSV ACRL FM C30- LB (Procedure Accessories) ×1
GAUZE PADS 4 X 4 (Dressing) ×1
GLOVE SRG PLISPRN 7.5 BGL PI INDCTR (Glove) ×1
GLOVE SRG PLISPRN 7.5 BGL PI MIC INDCTR (Glove) ×1
GLOVE SRG PLISPRN 7.5 BGL PI MIC LF STRL (Glove) ×3
GLOVE SURGICAL 7 1/2 BIOGEL PI INDICATOR (Glove) ×1
GLOVE SURGICAL 7 1/2 BIOGEL PI INDICATOR UNDERGLOVE POWDER FREE SMOOTH (Glove) ×1 IMPLANT
GLOVE SURGICAL 7 1/2 BIOGEL PI MICRO (Glove) ×1
GLOVE SURGICAL 7 1/2 BIOGEL PI MICRO INDICATOR POWDER FREE SMOOTH BEAD (Glove) ×1 IMPLANT
GLOVE SURGICAL 7.5 BIOGEL PI MICRO (Glove) ×3
GLOVE SURGICAL 7.5 BIOGEL PI MICRO POWDER FREE ROUGH BEAD CUFF (Glove) ×3 IMPLANT
NAIL IM T2 13MM 380MM LF STRL RTRGD FEM (Nail) ×1 IMPLANT
NAIL OD13 MM L380 MM FEMORAL T2 (Nail) ×1 IMPLANT
NAIL OD13 MM L380 MM FEMORAL T2 INTRAMEDULLARY RETROGRADE (Nail) ×1 IMPLANT
PACK SRG LF STRL XTRMT DISP ~~LOC~~ (Pack) ×1
PADDING CAST L4 YD X W6 IN UNDERCAST (Cast)
PADDING CAST L4 YD X W6 IN UNDERCAST MILD STRETCH COHESIVE WEBRIL (Cast) IMPLANT
PADDING CST CTTN WBRL 4YDX6IN LF STRL (Cast)
POUCH 11X7IN 2 ADHSV STRIP 2 COMPARTMENT STERI-DRAPE INSTRUMENT PLSTC (Drape) ×1 IMPLANT
POUCH INST PLS STRDRP 11X7IN STRL 2 ADH (Drape) ×1
POUCH L11 IN X W7 IN 2 ADHESIVE STRIP 2 (Drape) ×1
REAMER MODIFIED TRINKLE SHAFT (Reamer) ×1
REAMER MODIFIED TRINKLE SHAFT INTRAMEDULLARY BIXCUT L510 MM SURGICAL (Reamer) ×1 IMPLANT
REAMER SRG BIXCUT 510MM STRL MOD TRNKL (Reamer) ×1
SCREW BN 5MM 65MM STRL LCK (Screw) ×1 IMPLANT
SCREW BN 5MM 70MM STRL LCK (Screw) ×1 IMPLANT
SCREW BN T2 ALPHA 5MM 37.5MM STRL LCK (Screw) ×1 IMPLANT
SCREW BN T2 ALPHA 5MM 40MM STRL LCK (Screw) ×1 IMPLANT
SCREW BONE L80 MM OD5 MM LOCK STERILE 23615080S (Screw) ×2 IMPLANT
SCREW L37.5 MM OD5 MM LOCK BONE T2 ALPHA (Screw) ×1 IMPLANT
SCREW L40 MM OD5 MM LOCK BONE T2 ALPHA (Screw) ×1 IMPLANT
SCREW L65 MM OD5 MM LOCK BONE (Screw) ×1 IMPLANT
SCREW L65 MM OD5 MM LOCK BONE NA (Screw) ×1 IMPLANT
SCREW L70 MM OD5 MM LOCK BONE (Screw) ×1 IMPLANT
SCREW L70 MM OD5 MM LOCK BONE NA (Screw) ×1 IMPLANT
SCREW LCK 5X80MM (Screw) ×2 IMPLANT
SOLUTION ANSEP 70% ISPRP 4OZ LF RUB BTL (Scrub Supplies) ×1
SOLUTION ANTISEPTIC RUB BOTTLE MEDLINE (Scrub Supplies) ×1 IMPLANT
SPONGE GAUZE L4 IN X W4 IN 12 PLY (Sponge) ×2 IMPLANT
SPONGE GAUZE L4 IN X W4 IN 12 PLY ABSORBABLE CUT EDGE LOFT FISHERBRAND (Dressing) ×1 IMPLANT
SPONGE GZE PLS CTTN CRTY 4X4IN LF STRL (Sponge) ×2
SPONGE LAP CTTN 18X18IN LF STRL 4 PLY (Sponge) ×1
SPONGE LAPAROTOMY L18 IN X W18 IN 4 PLY (Sponge) ×1
SPONGE LAPAROTOMY L18 IN X W18 IN 4 PLY RADIOPAQUE PYRONEMA FREE HIGH (Sponge) ×1 IMPLANT
STAPLER SKIN L4.1 MM X W6.5 MM 35 WIDE (Staplers) ×1
STAPLER SKIN L4.1 MM X W6.5 MM 35 WIDE STAPLE CARTRIDGE APPOSE ULC (Staplers) ×1 IMPLANT
STAPLER SKN SS PLS APS U 4.1X6.5MM LF 35 (Staplers) ×1
TIP SCT VNYL LG ARG YNKR SLC-TRL 24FR LF (Suction) ×1
TIP SUCTION SELEC-TROL LARGE OD24 FR (Suction) ×1
TIP SUCTION SELEC-TROL LARGE OD24 FR VINYL (Suction) ×1 IMPLANT
TOOL DISSECTING L14 CM BALL FLUTE OD6 MM (Burr) ×1
TOOL DISSECTING L14 CM BALL FLUTE OD6 MM MIDAS REX LEGEND BURR (Burr) ×1 IMPLANT
TOOL DSCT BALL FLUT MDSRX LGND 6MM 14CM (Burr) ×1
TOWEL L27 IN X W17 IN COTTON PREWASH (Other) ×1
TOWEL L27 IN X W17 IN COTTON PREWASH DELINT HIGH ABSORBENT BLUE (Other) ×1 IMPLANT
TOWEL SRG CTTN 27X17IN LF STRL PREWASH (Other) ×1
TRAY SURGICAL EXTREMITY ~~LOC~~ (Pack) ×1 IMPLANT
WIRE FIXATION OD3 MM L285 MM KIRSCHNER (Instrument) ×1 IMPLANT
WIRE FX KRSH 3MM 285MM LF STRL (Instrument) ×1

## 2022-09-05 NOTE — Anesthesia Preprocedure Evaluation (Addendum)
Anesthesia Evaluation    AIRWAY    Mallampati: II    TM distance: >3 FB  Neck ROM: full  Mouth Opening:full  Planned to use difficult airway equipment: No CARDIOVASCULAR    regular       DENTAL    no notable dental hx                 PULMONARY    clear to auscultation     OTHER FINDINGS                                        Relevant Problems   NEURO/PSYCH   (+) Increasing frequency of seizure activity   (+) Seizure      GU/RENAL   (+) Liver laceration, major, initial encounter               Anesthesia Plan    ASA 3     general                     intravenous induction   Detailed anesthesia plan: general endotracheal and PNB        Post op pain management: per surgeon    informed consent obtained    Plan discussed with CRNA.      pertinent labs reviewed               Signed by: Caroleen Hamman, MD 09/05/22 6:59 AM    ===============================================================  Inpatient Anesthesia Evaluation    Patient Name: Laura Mclaughlin,Laura Mclaughlin  Surgeon: Gregery Na, MD  Patient Age / Sex: 61 y.o. 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    Depression     Hypothyroidism     Lupus     Restless leg syndrome        Past Surgical History:   Procedure Laterality Date    APPLICATION, EXTERNAL FIXATOR, UPPER EXTREMITY Right 06/14/2022    Procedure: APPLICATION, EXTERNAL FIXATOR, UPPER EXTREMITY;  Surgeon: Timoteo Ace, MD;  Location: [facility];  Service: Orthopedics;  Laterality: Right;    GASTRIC BYPASS N/A     HERNIA REPAIR      ORIF, FEMUR, DISTAL Left 06/11/2022    Procedure: ORIF, FEMUR, DISTAL;  Surgeon: Lucrezia Europe, MD;  Location: [facility];  Service: Orthopedics;  Laterality: Left;    ORIF, RADIUS, DISTAL (WRIST) Right 06/26/2022    Procedure: ORIF, RIGHT RADIUS, DISTAL  (WRIST);  Surgeon: Amado Nash, MD;  Location: [facility];  Service: Orthopedics;  Laterality: Right;    ORIF, TIBIA PLATEAU Right 06/14/2022    Procedure: ORIF, TIBIA PLATEAU RIGHT;  Surgeon: Timoteo Ace, MD;  Location: [facility] [facility];  Service: Orthopedics;  Laterality: Right;    REMOVE, MODIFY, UPPER EXTREMITY EXTERNAL FIXATOR Right 06/26/2022    Procedure: REMOVAL EXTERNAL FIXATOR;  Surgeon: Amado Nash, MD;  Location: [facility];  Service: Orthopedics;  Laterality: Right;         Allergies:     Allergies   Allergen Reactions    Dilaudid [Hydromorphone] Irregular Heart Rate  Reglan [Metoclopramide] Other (See Comments)     Tardive dyskinesia    Reglan [Metoclopramide] Other (See Comments)     Tardive dyskinesia          Medications:     No current facility-administered medications for this encounter.              Prior to Admission medications    Medication Sig Start Date End Date Taking? Authorizing Provider   acetaminophen (TYLENOL) 500 MG tablet Take 2 tablets (1,000 mg) by mouth every 8 (eight) hours 06/28/22   Cho, Dennard Nip, NP   b complex-vitamin c-folic acid (NEPHRO-VITE) 0.8 MG Tab Take 1 tablet by mouth daily 06/29/22   Cho, Dennard Nip, NP   bisacodyl (DULCOLAX) 10 mg suppository Place 1 suppository (10 mg) rectally daily as needed for Constipation  Patient not taking: Reported on 08/28/2022 06/20/22   Pincus Badder, FNP   Brivaracetam (Briviact) 50 MG Tab tablet Take 1 tablet (50 mg) by mouth every 12 (twelve) hours    [provider]   calcium citrate-vitamin D (CITRACAL+D) 315-6.25 MG-MCG Tab Take 1 tablet by mouth 3 (three) times daily 06/28/22   Cho, Dennard Nip, NP   cloBAZam 20 MG Tab Take 1 tablet (20 mg) by mouth nightly    [provider]   Desvenlafaxine ER 100 MG Tablet SR 24 hr Take 1 tablet (100 mg) by mouth daily    [provider]   diazePAM (VALIUM) 5 MG tablet Take 2 tablets (10 mg) by mouth nightly 05/01/21   [provider]    gabapentin (NEURONTIN) 300 MG capsule Take 2 capsules (600 mg) by mouth every 8 (eight) hours 06/28/22   Cho, Dennard Nip, NP   lamoTRIgine (LaMICtal) 150 MG tablet Take 2 tablets (300 mg) by mouth daily 06/21/22   Pincus Badder, FNP   levothyroxine (SYNTHROID) 75 MCG tablet Take 1 tablet (75 mcg) by mouth Once a day at 6:00am    [provider]   Meloxicam 7.5 MG Tablet Dispersible Take 1 tablet (7.5 mg) by mouth daily  Patient not taking: Reported on 09/03/2022    [provider]   methocarbamol 1000 MG Tab Take 1 tablet (1,000 mg) by mouth 4 (four) times daily 06/28/22   Cho, Sun M, NP   ondansetron (ZOFRAN-ODT) 4 MG disintegrating tablet Take 1 tablet (4 mg) by mouth every 8 (eight) hours as needed for Nausea  Patient not taking: Reported on 09/03/2022    [provider]   pantoprazole (PROTONIX) 40 MG tablet Take 1 tablet (40 mg) by mouth 2 (two) times daily    [provider]   Rotigotine 3 MG/24HR Patch 24 hr Place 1 patch (3 mg) onto the skin every 24 hours  Patient taking differently: Place 1 patch (3 mg) onto the skin every 24 hours 06/21/22   Pincus Badder, FNP   senna-docusate (PERICOLACE) 8.6-50 MG per tablet Take 1 tablet by mouth every 12 (twelve) hours  Patient not taking: Reported on 09/03/2022 06/20/22   Pincus Badder, FNP   topiramate (TOPAMAX) 50 MG tablet Take 1 tablet (50 mg) by mouth 2 (two) times daily    [provider]   topiramate (TOPAMAX) 50 MG tablet Take 3 tablets (150 mg) by mouth every 12 (twelve) hours  Patient taking differently: Take 6 tablets (300 mg) by mouth every 12 (twelve) hours 06/20/22   Pincus Badder, FNP   vitamin B-12 (CYANOCOBALAMIN) 1000 MCG tablet Take  1 tablet (1,000 mcg) by mouth every 14 (fourteen) days    [provider]   vitamin D, ergocalciferol, (DRISDOL) 50000 UNIT Cap Take 1 capsule (50,000 Units) by mouth once a week    [provider]   vitamins/minerals Tab Take 1 tablet by mouth 2  (two) times daily 06/28/22   Cho, Dennard Nip, NP     Vitals        Wt Readings from Last 3 Encounters:   09/03/22 108.9 kg (240 lb)   08/28/22 99.8 kg (220 lb)   06/13/22 125.1 kg (275 lb 12.7 oz)     BMI (Estimated body mass index is 35.44 kg/m as calculated from the following:    Height as of 09/03/22: 1.753 m (5\' 9" ).    Weight as of 09/03/22: 108.9 kg (240 lb).)  Temp Readings from Last 3 Encounters:   06/29/22 36.8 C (98.2 F) (Oral)   06/09/22 36.8 C (98.3 F) (Oral)   06/02/21 36.1 C (97 F) (Temporal)     BP Readings from Last 3 Encounters:   08/28/22 104/70   08/16/22 109/64   07/20/22 109/68     Pulse Readings from Last 3 Encounters:   08/28/22 75   08/16/22 81   07/20/22 68           Labs:   CBC:  Lab Results   Component Value Date    WBC 11.1 (H) 08/07/2022    HGB 12.4 08/07/2022    HCT 40.0 08/07/2022    PLT 324 08/07/2022       Chemistries:  Lab Results   Component Value Date    NA 144 08/07/2022    K 3.4 (L) 08/07/2022    CL 110 08/07/2022    CO2 23 08/07/2022    BUN 16 08/07/2022    CREAT 0.78 08/07/2022    GLU 84 08/07/2022    MG 1.8 06/15/2022    CA 9.3 08/07/2022    ALT 33 06/09/2022    AST 45 (H) 06/09/2022       Coags:  Lab Results   Component Value Date    PT 13.5 (H) 06/09/2022    PTT 28 06/09/2022    INR 1.2 (H) 06/09/2022     _____________________      Signed by: Caroleen Hamman, MD  09/05/22   6:59 AM    =============================================================

## 2022-09-05 NOTE — Brief Op Note (Signed)
Date/Time: 4:33 PM, 09/05/2022    Patient Name:     Laura Mclaughlin     Date of Operation:     09/05/2022     Providers Performing:     Surgeon(s) and Role:     * Gregery Na, MD - Primary     * Vanessa Ralphs Donella Stade, DPM - Resident - Assisting      Diagnosis:     Left distal femur impending nonunion     Procedure:     Procedure(s) (LRB):  LEFT DISTAL FEMUR NONUNION REPAIR, GRAFT (Left)  LEFT LOWER EXTREMITY HARDWARE REMOVAL (Left)         Anesthesia:      General + regional   No responsible provider has been recorded for the case.  Anesthesiologist: Caroleen Hamman, MD; Lovey Newcomer, MD  CRNA: Blair Heys, CRNA; Wilhelmenia Blase, CRNA        Estimated Blood Loss:     1000 mL     Implants:     Implant Name Type Inv. Item Serial No. Manufacturer Lot No. LRB No. Used Action   NAIL IM T2  LF STRL RTRGD FEM - [id number] Nail NAIL IM T2  LF STRL RTRGD FEM  STRYKER TRAUMA [id number] Left 1 Implanted   SCREW LCK 5X80MM - [id number] Screw SCREW LCK 5X80MM  STRYKER TRAUMA [id number] Left 1 Implanted   SCREW BN  STRL LCK - [id number] Screw SCREW BN  STRL LCK  [employer] [id number] Left 1 Implanted   SCREW BN  STRL LCK - [id number] Screw SCREW BN  STRL LCK  STRYKER TRAUMA [id number] Left 1 Implanted   SCREW BN T2 ALPHA 37.5MM STRL LCK - [id number] Screw SCREW BN T2 ALPHA 37.5MM STRL LCK  STRYKER TRAUMA [id number] Left 1 Implanted   SCREW BN T2 ALPHA  STRL LCK - [id number] Screw SCREW BN T2 ALPHA  STRL LCK  STRYKER TRAUMA [id number] Left 1 Implanted   SCREW LCK 5X80MM - [id number] Screw SCREW LCK 5X80MM  STRYKER TRAUMA [id number] Left 1 Implanted        Drains:     none     Complications:     None apparent      Weightbearing Status:     WBAT Left lower extremity     WB Status of other injured extremities: N/A    Hip Precautions : No    Postop Plan (Wound mgt, Return to OR, etc):     Wound Management:  - Definitively Closed    DVT Prophylaxis:   Lovenox      Foley catheter status: Does not have Foley    Dressings:  - Soft Dressings (no splint or cast): Dressing change on post-op day 2, then nursing/patient may change dressings as needed with clean gauze and tape or ACE bandages, unless otherwise instructed.      Antibiotics:  - Will continue standard prophylaxis x24 hours    Plan:  - No further orthopedic surgical interventions planned      Nathanial Millman, DPM

## 2022-09-05 NOTE — H&P (Signed)
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 Daughtrey and her family regarding her radiographs, diagnosis, and treatment plan. She is clinically not improving with regards to her distal femur fracture and has mechanical failure and impending malunion/nonunion on xrays. She would like to proceed with revision ORIF left distal femur. We discussed surgery and potential outcomes as well as complications at length, including risks, benefits and alternatives. She will require admission after this surgery. They are understanding and in agreement with the plan as discussed. All questions answered. Consent was obtained.     Medical Decision Making: (need 2 of 3)  Diagnosis Complexity:  (based on presenting problem and/or final diagnosis)    Complicated with risk to bodily function (based on presenting problem and/or Dx)         -  Is fracture pathologic (include osteoporosis or neoplastic lesions): No/Not applicable        -  Has Medical Co-morbidities/Surgical Risk-factors: yes       Data (Level 4- need 1, Level 5- need 2): X-rays     Risk of Management: High: Urgent/Emergent/Time-sensitive surgery: Risks/benefits discussed, patient is clear to proceed, and decision made to proceed with recommended surgery    Gregery Na, MD   Orthopaedic on-call pager [phone]     HPI     Kamali Sakata is a 20yr  female polytrauma with left intra-articular distal femur fracture s/p ORIF by my partner, Dr. Lucendia Herrlich on 06/11/22, right bicondylar tibial plateau fracture s/p ORIF by my partner, Dr. Calla Kicks on 06/14/22 and right distal radius fracture s/p ORIF by my partner, Dr. Broadus John on 06/26/22. She recently had CTO brace cleared by NSGY team for her cervical  spine injuries and is accompanied by her daughter today. Right leg is continuing to improve and she is now able to stand on this without pain. Left leg no improvement since prior visit with me and continues to have significant pain and limitations.     Symptoms present since several months.     Orthopaedic consultation has specifically been requested to address this patient's current musculoskeletal presentation.     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  Diagnosis: Obesity based on BMI criteria      Past Surgical History:   Procedure Laterality Date    APPLICATION, EXTERNAL FIXATOR, UPPER EXTREMITY Right 06/14/2022    Procedure: APPLICATION, EXTERNAL FIXATOR, UPPER EXTREMITY;  Surgeon: Timoteo Ace, MD;  Location: [facility];  Service: Orthopedics;  Laterality: Right;    GASTRIC BYPASS N/A     HERNIA REPAIR      ORIF, FEMUR, DISTAL Left 06/11/2022  Procedure: ORIF, FEMUR, DISTAL;  Surgeon: Lucrezia Europe, MD;  Location: [facility];  Service: Orthopedics;  Laterality: Left;    ORIF, RADIUS, DISTAL (WRIST) Right 06/26/2022    Procedure: ORIF, RIGHT RADIUS, DISTAL (WRIST);  Surgeon: Amado Nash, MD;  Location: [facility];  Service: Orthopedics;  Laterality: Right;    ORIF, TIBIA PLATEAU Right 06/14/2022    Procedure: ORIF, TIBIA PLATEAU RIGHT;  Surgeon: Timoteo Ace, MD;  Location: [facility];  Service: Orthopedics;  Laterality: Right;    REMOVE, MODIFY, UPPER EXTREMITY EXTERNAL FIXATOR Right 06/26/2022    Procedure: REMOVAL EXTERNAL FIXATOR;  Surgeon: Amado Nash, MD;  Location: [facility];  Service: Orthopedics;   Laterality: Right;       Labs     Results       ** No results found for the last 24 hours. **         Home Medications     Prior to Admission medications    Medication Sig Start Date End Date Taking? Authorizing Provider   acetaminophen (TYLENOL) 500 MG tablet Take 2 tablets (1,000 mg) by mouth every 8 (eight) hours 06/28/22  Yes Cho, Sun M, NP   b complex-vitamin c-folic acid (NEPHRO-VITE) 0.8 MG Tab Take 1 tablet by mouth daily 06/29/22  Yes Cho, Sun M, NP   bisacodyl (DULCOLAX) 10 mg suppository Place 1 suppository (10 mg) rectally daily as needed for Constipation 06/20/22  Yes Pincus Badder, FNP   calcium citrate-vitamin D (CITRACAL+D) 315-6.25 MG-MCG Tab Take 1 tablet by mouth 3 (three) times daily 06/28/22  Yes Cho, Sun M, NP   cloBAZam 20 MG Tab Take 1 tablet (20 mg) by mouth nightly   Yes [provider]   Desvenlafaxine ER 100 MG Tablet SR 24 hr Take 1 tablet (100 mg) by mouth daily   Yes [provider]   gabapentin (NEURONTIN) 300 MG capsule Take 2 capsules (600 mg) by mouth every 8 (eight) hours 06/28/22  Yes Cho, Sun M, NP   lamoTRIgine (LaMICtal) 150 MG tablet Take 2 tablets (300 mg) by mouth daily 06/21/22  Yes Pincus Badder, FNP   levothyroxine (SYNTHROID) 75 MCG tablet Take 1 tablet (75 mcg) by mouth Once a day at 6:00am   Yes [provider]   methocarbamol 1000 MG Tab Take 1 tablet (1,000 mg) by mouth 4 (four) times daily 06/28/22  Yes Cho, Sun M, NP   ondansetron (ZOFRAN-ODT) 4 MG disintegrating tablet Take 1 tablet (4 mg) by mouth every 8 (eight) hours as needed for Nausea   Yes [provider]   pantoprazole (PROTONIX) 40 MG tablet Take 1 tablet (40 mg) by mouth 2 (two) times daily   Yes [provider]   Rotigotine 3 MG/24HR Patch 24 hr Place 1 patch (3 mg) onto the skin every 24 hours  Patient taking differently: Place 1 patch (3 mg) onto the skin every 24 hours 06/21/22  Yes Pincus Badder, FNP   senna-docusate (PERICOLACE) 8.6-50 MG per tablet  Take 1 tablet by mouth every 12 (twelve) hours 06/20/22  Yes Pincus Badder, FNP   topiramate (TOPAMAX) 50 MG tablet Take 1 tablet (50 mg) by mouth 2 (two) times daily   Yes [provider]   topiramate (TOPAMAX) 50 MG tablet Take 3 tablets (150 mg) by mouth every 12 (twelve) hours  Patient taking differently: Take 6 tablets (300 mg) by mouth every 12 (twelve) hours 06/20/22  Yes  Pincus Badder, FNP   traMADol (ULTRAM-ER) 100 MG 24 hr tablet Take 1 tablet (100 mg) by mouth daily as needed for Pain   Yes [provider]   vitamin B-12 (CYANOCOBALAMIN) 1000 MCG tablet Take 1 tablet (1,000 mcg) by mouth every 14 (fourteen) days   Yes [provider]   vitamin D, ergocalciferol, (DRISDOL) 50000 UNIT Cap Take 1 capsule (50,000 Units) by mouth once a week   Yes [provider]   vitamins/minerals Tab Take 1 tablet by mouth 2 (two) times daily 06/28/22  Yes Cho, Sun M, NP   Brivaracetam (Briviact) 50 MG Tab tablet Take 1 tablet (50 mg) by mouth every 12 (twelve) hours    [provider]   diazePAM (VALIUM) 5 MG tablet Take 2 tablets (10 mg) by mouth nightly 05/01/21   [provider]   Meloxicam 7.5 MG Tablet Dispersible Take 1 tablet (7.5 mg) by mouth daily  Patient not taking: Reported on 09/03/2022    [provider]       Radiology Studies: (actual Orthopaedic relevant films interpreted & Radiology Reports reviewed by Orthopaedics)     (Personal Interpretation)  AP and lateral xrays of the left femur reveal distal femur fracture. There is minimal healing. There is loosening of the plate proximally with fixation failure and screw loosening/breakage. Alignment relatively unchanged.     XR Cervical Spine Flexion And Extension Only    Result Date: 08/28/2022  Findings and Impression: Lateral flexion and extension views performed. Stable displaced fracture of the ventral inferior corner of the C2 vertebral body 7 mm anterior displacement, and 48 degrees  rotation/angulation of the fracture fragment. Fracture fragment does not change with flexion and extension, and no instability is seen. C5-6 and C6-7 moderate disc space narrowing with associated endplate and uncovertebral joint hypertrophic changes. ReadingStation:[id number]       No results found.    Open Fractures Only         Physical Exam:   Patient is a 61 y.o. year old female who is alert, well appearing, and in no distress and oriented to person, place, and time.  Orientation: Fully Oriented    BP 128/65   Pulse 68   Resp 16   Ht 1.753 m (5\' 9" )   Wt 113.4 kg (250 lb)   SpO2 97%   BMI 36.92 kg/m   113.4 kg (250 lb)   1.753 m (5\' 9" )    Heart: normal rate  Lungs: non-labored breathing  Abdomen: soft, non-tender     Right Upper Extremity:   Right upper extremity:  Wounds well healed. No discharge and no evidence of infection.  +EPL, FPL, DI.  Sensation intact light touch radial, median, ulnar nerves  + radial pulse  Motion in wrist is improving, no pain with ROM.       Right Lower Extremity:   Right lower extremity:  Wounds are well healed. No discharge and no evidence of infection.  +DF/PF foot and all toes   Sensation intact light touch dorsal/plantar foot, toes and leg   + pedal pulse  No calf pain, negative Homan's.  Knee ROM is 0-100     Left Upper Extremity:   No crepitus or deformity  Skin intact.  +EPL, FPL, DI.  Sensation intact light touch radial, median, ulnar nerves  + radial pulse      Left Lower Extremity:   Left lower extremity:  Wounds are well healed. No discharge and no evidence of infection.  Limited motion secondary to pain.   +DF/PF foot and all toes   Sensation intact light touch dorsal/plantar foot, toes and leg   + pedal pulse  No calf pain, negative Homan's.       Pelvis:   Skin: normal   Palpation: Tenderness- nontender   Stability: normal       N.B. The review of the patient's medications does not in any way constitute an endorsement, by this clinician, of their use, dosage,  indications, route, efficacy, interactions, or other clinical parameters.This is also true of any test,lab,or other review that this clinician did not order.    This note was generated within the [employer] EMR using Dragon medical speech recognition software and may contain inherent errors or omissions not intended by the user. Grammatical and punctuation errors, random word insertions, deletions, pronoun errors and incomplete sentences are occasional consequences of this technology due to software limitations. Not all errors are caught or corrected.  Although every attempt is made to root out erroneus and incomplete transcription, the note may still not fully represent the intent or opinion of the author. If there are questions or concerns about the content of this note or information contained within the body of this dictation they should be addressed directly with the author for clarification.

## 2022-09-05 NOTE — Op Note (Signed)
FULL OPERATIVE NOTE    Date Time: 09/05/22 7:54 PM  Patient Name: Laura Mclaughlin, Laura Mclaughlin (MRN: [id number])  Attending Physician: Gregery Na, MD      Date of Operation:   09/05/2022    Providers Performing:   Surgeon(s) and Role:     Gregery Na, MD - Primary     * Nathanial Millman, DPM - Resident - Assisting    Surgical First Assistant(s):   None    Operative Procedure:   Left lower extremity removal of hardware, deep  Left distal femur nonunion osteotomy and repair with autograft   Insertion of retrograde intramedullary nail   Preoperative Diagnosis:   Left distal femur fracture nonunion     Postoperative Diagnosis:   Left distal femur fracture nonunion     Anesthesia:   General + regional    Findings:   Distal femur oligotrophic nonunion. Consolidated articular block.     Estimated Blood Loss:   1000 mL    Implants:     Implant Name Type Inv. Item Serial No. Model No. Manufacturer Lot No. LRB No. Used Action   NAIL IM T2  LF STRL RTRGD FEM - [id number] Nail NAIL IM T2  LF STRL RTRGD FEM  2339-1338S STRYKER TRAU[employer] [id number] Left 1 Implanted   SCREW LCK 5X80MM - [id number] Screw SCREW LCK 5X80MM  2361-5080S STRYKER TRAUMA [id number] Left 1 Implanted   SCREW BN  STRL LCK - [id number] Screw SCREW BN  STRL LCK  2361-5065S [employer]YK[employer] [id number] Left 1 Implanted   SCREW BN  STRL LCK - [id number] Screw SCREW BN  STRL LCK  2361-5070S STRYKER TRAUMA [id number] Left 1 Implanted   SCREW BN T2 ALPHA 37.5MM STRL LCK - [id number] Screw SCREW BN T2 ALPHA 37.5MM STRL LCK  2360-5037S STRYKER TRAUMA [id number] Left 1 Implanted   SCREW BN T2 ALPHA  STRL LCK - [id number] Screw SCREW BN T2 ALPHA  STRL LCK  2360-5040S STRYKER TRAUMA [id number] Left 1 Implanted   SCREW LCK 5X80MM - [id number] Screw SCREW LCK 5X80MM  [id number] STRYKER TRAUMA [id number] Left 1 Implanted        Drains:   Drains: no    Specimens:   * No specimens in log *    Complications:   None apparent      Operative Notes:   Indications for procedure: Please see my and my partners' consultation/notes for details.  The patient sustained a polytrauma with left distal femur fracture and underwent operative fixation by my partner in August 2023. She subsequently developed hardware failure with symptomatic impending nonunion. I discussed subsequent treatment options, alternatives as well as risks and benefits with the patient and her family. She would like to proceed with operative repair of nonunion and hardware removal. Consent was obtained.     Risks of surgery: Pain, infection, nerve and artery injury, bleeding, hardware failure, malunion, nonunion, refracture, dislocation, leg length inequality, malalignment, malrotation, complex regional pain syndrome, neuropathy, anemia, medical complications including but not limited to cardiovascular, respiratory, neurologic, blood clots, and death.     Description of procedure: The patient was identified in the preoperative holding area, the correct limb was marked with ink, and the consent was verified.  The patient was transferred to the operating room and administered anesthesia.  The limb was prepped and draped in normal sterile fashion.  All bony prominences were well-padded. Prophylactic antibiotics and transexamic acid were administered and an SCD was placed on the  nonoperative leg.  A final timeout was taken identifying the patient and surgical location.  All parties were in agreement.     I began with a lateral approach through the prior lateral surgical incision. Skin and subcutaneous tissue were incised sharply with a scalpel. I then incised the iliotibial band fascia and performed a subvastus approach to the distal femur. All neurovascular structures were protected throughout the procedure. The distal femur plate and nonunion site were exposed. I then removed all screws through the [employer] distal femur plate and the plate was removed. I removed an additional  broken screw in the femoral shaft.   I then made a small percutaneous anteromedial incision and removed all small and minifrag screws through this incision. I confirmed that all prior hardware had been removed in their entirety. The articular block appeared to be united and without gross motion.     I then turned my attention to the metaphyseal nonunion site. There was mobility of the nonunion site with several regions of hypertrophic scar tissue and regions of no apparent healing consistent with oligotrophic nonunion. I then performed careful meticulous debridement of the nonunion site with a combination of bovie electrocautery, curettes, rongeurs, osteotomes, cobb elevator, and a high speed burr. I debrided sclerotic bone ends with the high speed burr until bleeding, viable bone was present. Both the proximal and distal medullary canals were obliterated. I then used the high speed burr to re-cannulate the proximal and distal medullary canals. I performed copious irrigation with saline.     Once the nonunion site was sufficiently mobilized, I then performed reduction with a combination of clamps, bone hook, ballspike pusher and external manipulative reduction techniques. I confirmed reduction with visualization and multiplanar fluoroscopy.     I then made a 3 cm incision from the inferior pole of the patella distally.  I split the patellar tendon.  I inserted a starting wire for a retrograde femoral nail centered on the AP and at the apex of Blumensaat's line on the lateral.  I inserted the soft tissue guide and entry reamer. There was intramedullary sclerotic malunited fragment in the distal segment which I carefully reamed to prevent coronal deformity. I inserted the ball-tipped guidewire from the distal to the proximal segment, keeping it centered in the femoral canal and advanced it into the intertrochanteric line.  I measured, sequentially reamed and inserted the appropriate length Stryker T2 retrograde  femoral nail while maintaining reduction. The balltip guidewire was removed. I ensured that all reamings were retained at the nonunion site and deposited appropriately for sufficient autograft at the nonunion site.     I then utilized the jig to insert four interlocking screws distally, two lateral to medial and both oblique screws. I then used perfect circle technique to insert two static anterior to posterior directed interlocking screws proximally.  All screws had excellent purchase. The jig was then removed. Final multiplanar fluoroscopic imaging demonstrated appropriate reduction and fixation of the distal femur nonunion and appropriate hardware placement.       The knee exam was stable.  There was no evidence of femoral neck fracture.  Wounds were irrigated and hemostasis was achieved.  I closed the patellar tendon with #1 Vicryl, anteromedial capsule with #1 vicryl and the lateral capsule and IT band fascia with #1 vicryl. The subcutaneous tissue was then closed with 2-0 Monocryl and skin was closed with staples.  Sterile dressings were applied.  Leg lengths were equal and rotation was symmetric.  Compartments were  soft and pedal pulses were palpable. The patient was extubated and transferred to the PACU in stable condition. All counts were correct.      Postoperative plan:  Weight-bear as tolerated left lower extremity.  Range of motion as tolerated left lower extremity  24 hours antibiotics.  3 weeks DVT prophylaxis  Admission for pain control and discharge planning      F/u in the office in 2-3 weeks.  Likely staple removal at that time and AP and lateral views of the left femur.     Signed by: Gregery Na, MD  [facility]

## 2022-09-05 NOTE — Anesthesia Procedure Notes (Signed)
Peripheral Nerve Block    Patient location during procedure: OR  Reason for block: Post-op pain management      Injection technique: Single Shot  Block Region: Surveyor, mining  Laterality: Left      Block at surgeon's request Yes        Staffing  Anesthesiologist: Caroleen Hamman, MD  Performed: Anesthesiologist       Pre-procedure Checklist   Completed: patient identified, surgical consent, pre-op evaluation, timeout performed, risks and benefits discussed, anesthesia consent given and correct site        Peripheral Block  Patient monitoring: NIBP, EKG, Pulse oximetry and Nasal cannula O2  Patient position: Supine  Premedication: Yes and Meaningful contact maintained  Local infiltration: Lidocaine 1%      Needle  Needle type: Stim needle   Needle gauge: 21 G  Needle length: 4 in      Guidance: ultrasound guided  Ultrasound Guided: LA spread visualized, Image stored or printed, Needle visualized and Relevant anatomy identified (nerve, vessels, muscle)          Assessment   Incremental injection: yes  Injection made incrementally with aspirations every 5 mL.  Injection Resistance: no  Paresthesia Pain: no    Blood Aspirated: No  no suspected intravascular injection  Patient tolerated procedure well: Yes  Block Outcome: No complications

## 2022-09-05 NOTE — Progress Notes (Signed)
Assisted anesthesiologist Dr. Darden Dates with placing left femoral and left fascia iliaca peripheral nerve blocks, single shots, intraop. Vital signs monitored and charted by CRNA. Sterile protocol maintained. Patient was previously sedated and intubated for procedure.  Expected outcomes, (pain control,decreased opioid requirements and side effects, limb safety) discussed with patient and family in preop by anesthesiologist. No complications noted while placing nerve block.

## 2022-09-05 NOTE — Discharge Instr - AVS First Page (Addendum)
[facility]  [address], [address]  [place], [place] [address]  Phone: [phone]  Fax [phone]    DISCHARGE INSTRUCTIONS:  Date of Admission: 09/05/2022   Admission Diagnosis: Closed fracture of distal end of left femur, unspecified fracture morphology, initial encounter [S72.402A]  Closed bicondylar fracture of distal femur, left, with nonunion, subsequent encounter [Z56.387F, S72.432K]   Date of Most Recent Surgery: 09/05/2022 Procedure(s) (LRB):  LEFT DISTAL FEMUR NONUNION REPAIR, GRAFT (Left)  LEFT LOWER EXTREMITY HARDWARE REMOVAL (Left)    Please contact the office as soon as possible to schedule a follow-up appointment with Gregery Na, MD. Bonita Quin will need to be seen approximately 2 weeks from the date of your surgery.   - Please plan to arrive at least 15 minutes prior to your appointment time to allow yourself time for parking and check-in.   - If needed, we will obtain x-rays at your first orthopaedic follow-up appointment. X-rays do not need to be done prior to your appointment unless you have certain types of insurance or have been instructed to do so (if you have multiple injuries). If you have questions, please contact our office for guidance.  - If you underwent surgery, your incision will be examined at your first follow-up appointment and typically, staples/sutures will be removed.  - Please check with your insurance to see if a referral is needed prior to your first visit to avoid any delays.  - Your insurance is noted as: Payor: MEDICARE / Plan: MEDICARE PART A AND B / Product Type: Medicare / . Please contact our office if this needs to be changed.    Activity / Weight Bearing Status:  Weightbearing as tolerated on the Left Lower Extremity      Wound Care:  - If you notice increased drainage, incision redness, or foul smell from the incision/wound and/or develop a fever, please inform the office immediately and/or proceed directly to the Emergency Department for  prompt care.  - Soft Dressings (no splint or cast): You may shower 3 days after surgery (no baths or submerging incision) unless otherwise instructed. Do not scrub incision, and pat the incision dry with a clean towel.  You may change dressings as needed with clean gauze and tape or ACE bandages, unless given specific instructions.    Medications:  -In order to minimize narcotic use and establish better pain control, we employ a multimodal pain approach. Dosing of medications will vary from patient to patient based on your specific needs, allergies and past medical history, so please refer to your discharge medication list.   - Pain management protocol utilizes several medications all listed below:    - Tylenol: Tylenol (acetaminophen) can be obtained over-the-counter. It is important limit dosage to no more than 3000 mg in a 24 hr period. Patients with liver problems are advised to refrain from taking until discussed with primary care provider. Usual dosing for adults is 1000mg  every 8 hrs for the first 2-3 weeks, then as needed.  For children, we recommend weight based dosing as directed on the medication packaging.  Prior to adding Tylenol, please ensure that other pain medications do not contain tylenol.   - Gabapentin: Please take Gabapentin (neurontin) as instructed three times per day, since this medication works best when taken as scheduled, not as needed. Gabapentin should not be stopped abruptly as it may cause withdrawal. Use of this medication will help pain control by a different mechanism than narcotics and also help you  wean off the narcotics more quickly. Most patients will take this med for 2-4 weeks.  To wean off of medication, stop taking the mid-day dose for 3 days, then stop taking the Morning dose for 3 days, and finally, stop taking the evening dose.   - Narcotics: (Oxycodone/Dilaudid/Hydrocodone/Tramadol/etc.): This class of medications are prescribed opioid medications and should only be  taken as instructed. It is also important to be on a consistent bowel regimen (see below) when taking narcotic medications, as constipation is a common side effect. If prescribed a dose range, recommend taking the lowest dose for moderate pain and the higher dose for severe pain. Most patients will take these medications for about a week, possibly two weeks and Narcotics should be the first medication that you wean off of taking. It is important that you DO NOT operate a motor vehicle or make important decisions while taking these meds.  Finally, avoid consuming alcohol or other medications that are sedating or could affect your mental status while taking narcotics.  - Bowel Regimen: Take stool softeners (ex. Colace) as long as you are taking narcotic pain medicines. If you are unable to have a bowel movement every 2-3 days, please proceed with over the counter laxatives (Senokot -S/Miralax), enemas, or suppositories for constipation relief.  - NSAIDs: (Motrin, Advil, Ibuprofen, Naprosyn, Aleve) - *If you are enrolled in NSAID study, please follow study instructions above depending on if you are in YES NSAID arm of the study vs the NO NSAID arm of the study*.  NSAIDs can be found over the counter.  There are occasional fractures that are notorious for slow healing, in which case you will be advised against using this class of medications. Adults: Recommend Ibuprofen 600mg  every 8 hours for the first 2-3 weeks (taken with food) then you may wean as tolerated. Discontinue if you experience stomach upset. Children: Recommend using Motrin dosed per the child's weight as instructed on the packaging. If you have Kidney problems, DO NOT take this medication until discussing with your primary care provider.  These medicines cannot be taken if you are pregnant.   Stomach Ulcer Prevention: Ongoing use of NSAID medications can be very irritating to your stomach, potentially leading to gastritis or ulcers.  While taking  NSAID's, we recommend that you also take Nexium (Esomeprazole) 20mg  or Prevacid (Lansoprazole) 15 mg daily while on NSAID's, both of which are over the counter medications.  Blood Clot Prevention:  - You may have been prescribed a blood thinner such as Lovenox (Enoxaparin), Heparin, Aspirin, or Xarelto (Rivaroxaban) to reduce the risk of a blood clot (deep vein thrombosis; DVT).  The type of medicine and length or preventative treatment is based on you injury type and procedure performed.   - Typically for pelvis and hip fractures, lovenox is prescribed for 3 weeks followed by a baby aspirin (81 mg) by mouth twice per day for 3 more weeks.  - Typically for femur (thigh), knee, lower leg, ankle, and foot fractures, baby aspirin (81 mg) twice per day for 3 weeks is prescribed.  - Patients with additional injuries (head, chest, spine, abdomen), wheelchair bound for long periods of time, bleeding disorders, and other medical problems may have adjusted medications and doses.  - If you have been prescribed lovenox and are not able to fill it, we suggest that you take 1 baby aspirin (81 mg) by mouth twice daily (every 12 hours).     Diet:  -  Please resume your normal diet. It  is common to have a decreased appetite for the first 1-2 weeks or following your injury/surgery. This may be due to pain and the pain medications that you are taking. Please be sure to drink plenty of fluids during this time to remain hydrated.     X-Rays (Radiology Studies):   - If you obtain x-rays (or MRI or CT scans) anywhere other than our office, please make sure to bring the actual images (on a CD) to the office visit. A radiology report is not adequate - your provider must visualize the actual images to accurately determine the plan of care.   - Prior to your first clinic visit, please call your insurance and/or our office to ensure that you have insurance coverage to obtain x-rays in our office. If you are unable to obtain them in our office  at your clinic visit, we will assist you in finding an imaging facility that accepts your healthcare plan.    Physical/Occupational Therapy:  - Physical or occupational therapy may be prescribed for rehabilitation during your recovery. [facility] are all part of the same healthcare system as [facility] and our office. These centers accept most health insurance plans, but we encourage checking with your insurance plan to ensure appropriate coverage.    We hope that you have experienced excellent care from our team at [facility]. We continuously strive to improve and offer state of the art and world class care to our patients.  If you would like to express your gratitude or make a donation to support [facility]'s efforts, please contact us at: [phone] or [email or web address].    For emotional support resources after your traumatic injury, please contact the [facility] Network Coordinator, Adele Schilder, at [phone] or [email or web address]       [facility] MyChart - Take control of your medical records    MyChart allows you immediate, secure and confidential access to your medical records.  Patients like you use MyChart's Electronic Medical Record for better access to:  Appointment Scheduling - Make an appointment with your existing provider and manage your appointments.  After-Visit Summaries - View a summary of your appointments with instructions and comments from your provider.  Communicate with Your Provider's Team - Correspond directly with your provider about your health or refill a prescription anywhere/anytime.      Sign up using three easy steps:   Please go to [email or web address]           Click on Sign up now to access the new member sign up page  Enter your unique MyChart Access Code exactly as it appears below to complete the sign-up process.  If you experience issues with activating your  account, the information you have provided does not completely match our records. Please contact your provider's office or any Hospital Registration Department to correct the information required.   MyChart Access Code: Activation code not generated  Current MyChart Status: Active      Having technical trouble with MyChart?   Call and select "[phone]" to speak to a representative.  [phone])

## 2022-09-05 NOTE — Progress Notes (Addendum)
Orthopedic Trauma Daily Progress Note    09/06/2022 6:01 AM    Laura Mclaughlin is a 61 y.o. 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     Subjective: No acute events overnight.  States pain has been significantly well controlled.  Patient Denies nausea, vomiting, or fevers.     Physical Exam:  Vitals:    09/06/22 0332   BP: 119/67   Pulse: 92   Resp: 18   Temp: 97.5 F (36.4 C)   SpO2: 94%        Intake/Output Summary (Last 24 hours) at 09/06/2022 0601  Last data filed at 09/06/2022 0016  Gross per 24 hour   Intake 2950 ml   Output 1900 ml   Net 1050 ml     Lab Results   Component Value Date    INR 1.2 (H) 09/05/2022       left lower extremity:  Dressings with strike through.  Replaced proximal Tegaderm and gauze and underlying incision was well coapted.  +TA,GS  +EHL,FHL  Sensation intact light touch dorsal/plantar foot  Cap refill < 2 seconds  Pedal pulses palpable  No pain with passive ROM digits    Lab Results   Component Value Date    HGB 7.2 (L) 09/06/2022    HGB 8.4 (L) 09/05/2022    HGB 9.1 (L) 09/05/2022      Lab Results   Component Value Date    HCT 22.6 (L) 09/06/2022    HCT 26.9 (L) 09/05/2022    HCT 28.5 (L) 09/05/2022    Review of pertinent labs demonstrate:   - Acute blood loss anemia (clinically significant drop of >2.0 HGB and/or >6.0 HCT) during the current admission.         Assessment: 61 y.o. female  1 Day Post-Op s/p L distal femur nonunion resection, ORIF with intramedullary retrograde nail.     Plan:   - Mobility: Out of bed as tolerated with PT/OT   - Pain control: Continue to wean/titrate to appropriate oral regimen   - DVT Prophylaxis (per Ortho Trauma service Protocol): Lovenox   - Foley catheter status: Does not have Foley   - Further surgical plans: No further Orthopaedic plans    - RUE: WBAT   - LUE:  WBAT   - RLE:  WBAT   - LLE:  WBAT   - No transfusion indicated, but will continue to follow serial labs and titrate IV fluids & volume expanders as  indicated to optimize hemodynamics/volume status.   - Disposition: pending PT/OT evaluation    Nathanial Millman, DPM         Agree w/ above    D/C plans discussed    Shawnie Pons MD      N.B. The review of the patient's medications does not in any way constitute an endorsement, by this clinician,  of their use, dosage, indications, route, efficacy, interactions, or other clinical parameters.

## 2022-09-05 NOTE — Transfer of Care (Signed)
Anesthesia Transfer of Care Note    Patient: Laura Mclaughlin    Procedures performed: Procedure(s):  LEFT DISTAL FEMUR NONUNION REPAIR, GRAFT  LEFT LOWER EXTREMITY HARDWARE REMOVAL    Anesthesia type: General ETT    Patient location:Phase I PACU    Last vitals:   Vitals:    09/05/22 1631   BP: 110/69   Pulse: 87   Resp: 16   Temp: 36.2 C (97.1 F)   SpO2: 100%       Post pain: Patient not complaining of pain, continue current therapy      Mental Status:awake and alert     Respiratory Function: tolerating face mask    Cardiovascular: stable    Nausea/Vomiting: patient not complaining of nausea or vomiting    Hydration Status: adequate    Post assessment: no apparent anesthetic complications    Signed by: Blair Heys, CRNA  09/05/22 4:32 PM

## 2022-09-05 NOTE — Anesthesia Procedure Notes (Signed)
Peripheral Nerve Block    Patient location during procedure: OR  Reason for block: Post-op pain management      Injection technique: Single Shot  Block Region: Femoral  Laterality: Left      Block at surgeon's request Yes        Staffing  Anesthesiologist: Caroleen Hamman, MD  Performed: Anesthesiologist       Pre-procedure Checklist   Completed: patient identified, surgical consent, pre-op evaluation, timeout performed, risks and benefits discussed, anesthesia consent given and correct site        Peripheral Block  Patient monitoring: NIBP, EKG, Pulse oximetry and Nasal cannula O2  Premedication: Yes and Meaningful contact maintained  Local infiltration: Lidocaine 1%      Needle  Needle type: Stim needle   Needle gauge: 21 G  Needle length: 4 in      Guidance: ultrasound guided  Ultrasound Guided: LA spread visualized, Image stored or printed, Needle visualized and Relevant anatomy identified (nerve, vessels, muscle)          Assessment   Incremental injection: yes  Injection made incrementally with aspirations every 5 mL.  Injection Resistance: no  Paresthesia Pain: no    Blood Aspirated: No  no suspected intravascular injection  Patient tolerated procedure well: Yes  Block Outcome: No complications and Successful block

## 2022-09-06 ENCOUNTER — Encounter: Payer: Self-pay | Admitting: Student in an Organized Health Care Education/Training Program

## 2022-09-06 DIAGNOSIS — S72422K Displaced fracture of lateral condyle of left femur, subsequent encounter for closed fracture with nonunion: Secondary | ICD-10-CM

## 2022-09-06 DIAGNOSIS — S72432K Displaced fracture of medial condyle of left femur, subsequent encounter for closed fracture with nonunion: Secondary | ICD-10-CM

## 2022-09-06 LAB — BASIC METABOLIC PANEL
Anion Gap: 8 (ref 5.0–15.0)
BUN: 9 mg/dL (ref 7.0–21.0)
CO2: 20 mEq/L (ref 17–29)
Calcium: 8.2 mg/dL — ABNORMAL LOW (ref 8.5–10.5)
Chloride: 112 mEq/L — ABNORMAL HIGH (ref 99–111)
Creatinine: 0.7 mg/dL (ref 0.4–1.0)
Glucose: 146 mg/dL — ABNORMAL HIGH (ref 70–100)
Potassium: 3.9 mEq/L (ref 3.5–5.3)
Sodium: 140 mEq/L (ref 135–145)
eGFR: >60 mL/min/{1.73_m2} (ref >=60)

## 2022-09-06 LAB — CBC
Absolute NRBC: 0 10*3/uL (ref 0.00–0.00)
Hematocrit: 22.6 % — ABNORMAL LOW (ref 34.7–43.7)
Hgb: 7.2 g/dL — ABNORMAL LOW (ref 11.4–14.8)
MCH: 29.8 pg (ref 25.1–33.5)
MCHC: 31.9 g/dL (ref 31.5–35.8)
MCV: 93.4 fL (ref 78.0–96.0)
MPV: 10.9 fL (ref 8.9–12.5)
Nucleated RBC: 0 /100 WBC (ref 0.0–0.0)
Platelets: 214 10*3/uL (ref 142–346)
RBC: 2.42 10*6/uL — ABNORMAL LOW (ref 3.90–5.10)
RDW: 14 % (ref 11–15)
WBC: 7.14 10*3/uL (ref 3.10–9.50)

## 2022-09-06 MED ORDER — METHOCARBAMOL 500 MG PO TABS
500.0000 mg | ORAL_TABLET | Freq: Three times a day (TID) | ORAL | Status: DC
Start: 2022-09-06 — End: 2022-09-07
  Administered 2022-09-06 – 2022-09-07 (×3): 500 mg via ORAL
  Filled 2022-09-06 (×3): qty 1

## 2022-09-06 MED ORDER — HYDROMORPHONE HCL 0.5 MG/0.5 ML IJ SOLN
0.4000 mg | INTRAMUSCULAR | Status: DC | PRN
Start: 2022-09-06 — End: 2022-09-08
  Administered 2022-09-06 (×2): 0.4 mg via INTRAVENOUS
  Filled 2022-09-06 (×2): qty 1

## 2022-09-06 MED ORDER — ONDANSETRON HCL 4 MG PO TABS
4.0000 mg | ORAL_TABLET | Freq: Three times a day (TID) | ORAL | Status: DC | PRN
Start: 2022-09-06 — End: 2022-09-08
  Administered 2022-09-07: 4 mg via ORAL
  Filled 2022-09-06: qty 1

## 2022-09-06 MED ORDER — GABAPENTIN 300 MG PO CAPS
400.0000 mg | ORAL_CAPSULE | Freq: Three times a day (TID) | ORAL | Status: DC
Start: 2022-09-06 — End: 2022-09-08
  Administered 2022-09-06 – 2022-09-08 (×7): 400 mg via ORAL
  Filled 2022-09-06 (×7): qty 1

## 2022-09-06 MED ORDER — ONDANSETRON HCL 4 MG/2ML IJ SOLN
4.0000 mg | Freq: Three times a day (TID) | INTRAMUSCULAR | Status: DC | PRN
Start: 2022-09-06 — End: 2022-09-08
  Administered 2022-09-06: 4 mg via INTRAVENOUS
  Filled 2022-09-06: qty 2

## 2022-09-06 NOTE — Plan of Care (Addendum)
Patient arrived on unit a/o x 4. Patient safety maintained with fall matts in place, call bell in reach, and personal items on bedside table. Patient denies any SOB, chest pain, or dizziness. Skin assessment done with Charge nurse Aggie Cosier. Left hip surgical site with some sero sanguinous drainage present. Care team aware  Pain management in progress. Patient oriented to unit with white board explained and concerns addressed. Continue to monitor.      Problem: Hip Surgery  Goal: Free from Infection  Flowsheets (Taken 09/06/2022 0148)  Free from infection:   Monitor/assess vital signs   Maintain temperature within desired parameters   Assess for signs and symptoms of infection   Assess surgical dressing, reinforce or change as needed per order   Teach/reinforce use of incentive spirometer 10 times per hour while awake, cough and deep breath as needed   Foley maintenance care if Foley in place. Utilize indwelling urinary catheter policy/protocol ([facility]).   Administer and discontinue antibiotics per SCIP measures  Goal: Nutritional Intake is Adequate  Outcome: Progressing  Flowsheets (Taken 09/06/2022 0148)  Nutritional intake is adequate:   Assess GI status (bowel sounds, nausea/vomiting, distention, flatus)   Administer anti-emetic if needed as prescribed   Advance diet as ordered and tolerated   Administer stool softener as prescribed  Goal: Neurovascular Status is Stable  Outcome: Progressing  Flowsheets (Taken 09/06/2022 0148)  Neurovascular status is stable:   Assess and document plantar/dorsiflexion   VTE prevention: administer anticoagulant(s) and/or apply anti-embolism stockings/devices as ordered   Monitor/assess neurovascular status (pulses, capillary refill, pain, paresthesia, presence of edema)  Goal: Hemodynamic Stability  Outcome: Progressing  Flowsheets (Taken 09/06/2022 0148)  Hemodynamic stability:   Monitor/assess vital signs   Maintain temperature within desired parameters   Monitor SpO2 and treat as  needed   Monitor intake and output.  Notify LIP if urine output is less than 30 ml/hr.   Monitor/assess surgical drainage output if drain is present   Monitor/assess lab values and report abnormal values  Goal: Mobility/activity is maintained at optimum level for patient  Outcome: Progressing  Flowsheets (Taken 09/06/2022 0148)  Mobility/activity is maintained at optimal level for patient:   Evaluate if patient comfort function goal is met   Utilize special equipment (trapeze, abduction pillow, regular pillow, walker) as needed and as ordered   Dangle with assistance if indicated   Out of bed to chair with assistance   Ambulate greater than or equal to 80 feet with assistance   PT and/or OT evaluation and treatment if ordered   Teach/review/reinforce weight bearing status with patient/patient care companion   Teach/review/reinforce hip precautions with patient/patient care companion   Teach/review/reinforce exercises (ankle pumps, quad sets, gluteal sets)   Participate in PT and/or OT plan of care (bed mobility, transfer/gait training, ADL, curb stair training)  Goal: Pain at adequate level as identified by patient  Outcome: Progressing  Flowsheets (Taken 09/06/2022 0148)  Pain at adequate level as identified by patient:   Utilize special equipment (trapeze, abduction pillow, regular pillow, walker) as needed and as ordered   Reposition patient every 2 hours and PRN unless able to self-reposition   Initiate and reinforce use of PCA/PCE, if ordered   Evaluate if patient comfort function goal is met  Goal: Address patient self-management plan  Outcome: Progressing  Flowsheets (Taken 09/06/2022 0148)  Address patient self-management plan:   Evaluate barriers of lifestyle changes   Engage patient/patient care companion in patient's self-management plan   Assess/document response to lifestyle  change   Address education needs regarding disease progression   Revise self-management plan based on assessed patient needs  Goal:  Patient/Patient Care Companion demonstrates understanding of disease process, treatment plan, medications, and discharge plan  Outcome: Progressing  Flowsheets (Taken 09/06/2022 0148)  Patient/patient care companion demonstrates understanding of disease process, treatment plan, medications, and discharge plan:   Provide patient/patient care companion with Joint packet if indicated   Provide education on patient medications, medication side effects, dressing changes, activity level, brace instructions and care to patient/patient care companion   Consult/collaborate with Case Management/Social Work   Provide anticoagulation teaching if indicated   Identify special equipment needs for discharge

## 2022-09-06 NOTE — Anesthesia Postprocedure Evaluation (Signed)
Anesthesia Post Evaluation    Patient: Laura Mclaughlin    Procedure(s):  LEFT DISTAL FEMUR NONUNION REPAIR, GRAFT  LEFT LOWER EXTREMITY HARDWARE REMOVAL    Anesthesia type: general    Last Vitals:   Vitals Value Taken Time   BP 110/59 09/05/22 2000   Temp 36.6 C (97.8 F) 09/05/22 2000   Pulse 73 09/05/22 2010   Resp 20 09/05/22 2010   SpO2 97 % 09/05/22 2010                 Anesthesia Post Evaluation:           Pain Management: adequate  Multimodal analgesia pain management approach  Strategies: regional block          Anesthetic complications: No          Cardiovascular status: acceptable  Respiratory status: acceptable  Hydration status: acceptable      Signed by: Lovey Newcomer, MD, 09/06/2022 6:29 AM

## 2022-09-06 NOTE — Plan of Care (Signed)
Procedure(s):  LEFT DISTAL FEMUR NONUNION REPAIR WITH IM NAIL  LEFT LOWER EXTREMITY HARDWARE REMOVAL    1 Day Post-Op  -------------------     Most Recent Set of Vitals   Visit Vitals  BP 126/80   Pulse 99   Temp 98.2 F (36.8 C) (Oral)   Resp 18   Ht 1.753 m (5\' 9" )   Wt 113.4 kg (250 lb)   SpO2 94%   BMI 36.92 kg/m         Pt A&O X4. VSS.  Tolerated whole pills with thin liquids.  Pt able to transfere to w/c and take a few steps at side of bed as well as reposition self in bed with mod assistance.   Encouraged TCDB and IS use.   Continent of bowel and bladder. LBM pre op. Pt voiding via external catheter.   Pain Controlled with oxycodone 10mg  with iv dilaudid for breakthru pain. Neurontin dosage also increased.  Good pedal pulses bilaterally,cap refill <3 sec in all 4 extremities.  All dressings are clean, dry, and intact and all sites appear free from visual signs of infection.  Hip dressing reinforced this am with ABD pads, remains clean dry and intact.   Call bell and bedside table within reach at all times. Will continue to monitor patient  closely.     Notable Shift Events      , RN  09/06/22  5:27 PM        Problem: Moderate/High Fall Risk Score >5  Goal: Patient will remain free of falls  Outcome: Progressing     Problem: Hip Surgery  Goal: Free from Infection  Outcome: Progressing  Goal: Nutritional Intake is Adequate  Outcome: Progressing  Goal: Neurovascular Status is Stable  Outcome: Progressing  Goal: Hemodynamic Stability  Outcome: Progressing  Goal: Mobility/activity is maintained at optimum level for patient  Outcome: Progressing  Goal: Pain at adequate level as identified by patient  Outcome: Progressing  Goal: Address patient self-management plan  Outcome: Progressing  Goal: Patient/Patient Care Companion demonstrates understanding of disease process, treatment plan, medications, and discharge plan  Outcome: Progressing

## 2022-09-06 NOTE — OT Eval Note (Signed)
Occupational Therapy Evaluation  Laura Mclaughlin      Post Acute Care Therapy Recommendations:     Discharge Recommendations:  Acute Rehab      If the above recommendation is not available, the pt will need hands on assist for functional mobility and for ADLs, HHA, HHOT.    DME needs IF patient is discharging home: Patient already has needed equipment    Therapy discharge recommendations may change with patient status.  Please refer to most recent note for up-to-date recommendations.    Assessment:   Significant Findings: none    Laura Mclaughlin is a 61 y.o. female admitted 09/05/2022. Pt received in bed and was agreeable to OT. Pt required SBA for supine <> sit transfers and MinA of 2 people for sit <> stand transfers w/ RW. Pt required Min A of 2 people w/ RW for transfer from Bed to Wheelchair, able to take 3-4 side steps. Pt required Min A and set-up for UB dressing, Max A for LB dressing, and supervision and set-up for eating/drinking. Pt anticipated to be able to participate in 3 hours of therapy a day, very motivated. Pt educated on importance of seated ADLs, energy conservation techniques, and pacing for increased independence with verbal understanding. Pt presents with delayed balance strategies, gait impairment, dec endurance/activity tolerance, dec bed mobility, dec transfer mobility, and pain limiting participation/functional mobility which limits her performance in ADLs. Rec AR. Pt not at functional baseline, would benefit from continued acute OT to optimize return to PLOF.    Therapy Diagnosis: decreased adl performance    Impairments: decreased activity tolerance, gait impairment, impaired balance, impaired bed mobility, and pain    Rehabilitation Potential: good    Treatment Activities: OT evaluation, adl retraining  Educated the patient to role of occupational therapy, plan of care, goals of therapy and safety with mobility and ADLs, energy conservation techniques, pursed lip breathing, home  safety.    Plan:   OT Frequency Recommended: 4-5x/wk     Treatment Interventions: ADL retraining;Functional transfer training;Endurance training;Patient/Family training     Risks/benefits/POC discussed with pt and daughter    Unit: [facility] TOWER 8  Bed: F847/F847.01     Precautions and Contraindications:   Falls  WBAT LLE, WBAT RLE  CTO brace cleared by neurosurgery    Consult received for Laura Mclaughlin for OT Evaluation and Treatment.  Patient's medical condition is appropriate for Occupational Therapy intervention at this time.    History of Present Illness:   Laura Mclaughlin is a 61 y.o. 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    Depression     Hypothyroidism     Lupus     Restless leg syndrome      Past Surgical History:   Procedure Laterality Date    APPLICATION, EXTERNAL FIXATOR, UPPER EXTREMITY Right 06/14/2022  Procedure: APPLICATION, EXTERNAL FIXATOR, UPPER EXTREMITY;  Surgeon: Timoteo AceHolzman, Michael A, MD;  Location: [place] T[facility] OR;  Service: Orthopedics;  Laterality: Right;    GASTRIC BYPASS N/A     HERNIA REPAIR      ORIF, FEMUR, DISTAL Left 06/11/2022    Procedure: ORIF, FEMUR, DISTAL;  Surgeon: Lucrezia EuropeHymes, Robert A, MD;  Location: [place] [facility] OR;  Service: Orthopedics;  Laterality: Left;    ORIF, RADIUS, DISTAL (WRIST) Right 06/26/2022    Procedure: ORIF, RIGHT RADIUS, DISTAL (WRIST);   Surgeon: Amado NashSchulman, Jeff E, MD;  [place] [facility];  Service: Orthopedics;  Laterality: Right;    ORIF, TIBIA PLATEAU Right 06/14/2022    Procedure: ORIF, TIBIA PLATEAU RIGHT;  Surgeon: Timoteo AceHolzman, Michael A, MD;  Location: [place] [facility] OR;  Service: Orthopedics;  Laterality: Right;    REMOVAL, HARDWARE, COMPLEX, NON-SPINE Left 09/05/2022    Procedure: LEFT LOWER EXTREMITY HARDWARE REMOVAL;  Surgeon: Gregery NaGoch, Abraham, MD;  Location: [place] [facility] OR;  Service: Orthopedics;  Laterality: Left;    REMOVE, MODIFY, UPPER EXTREMITY EXTERNAL FIXATOR Right 06/26/2022    Procedure: REMOVAL EXTERNAL FIXATOR;  Surgeon: Amado NashSchulman, Jeff E, MD;  [facility]: [facility];  Service: Orthopedics;  Laterality: Right;    REPAIR, NONUNION, FEMUR, NO GRAFT Left 09/05/2022    Procedure: LEFT DISTAL FEMUR NONUNION REPAIR WITH IM NAIL;  Surgeon: Gregery NaGoch, Abraham, MD;  Location: [place] [facility] OR;  Service: Orthopedics;  Laterality: Left;       X-Rays/Tests/Labs:  Rad: reviewed  XR Femur Left AP And Lateral    Result Date: 09/05/2022  Status post internal fixation of the distal metadiaphyseal fracture with an intramedullary nail, with improved alignment. Fredrich BirksAmit Malhotra, MD 09/05/2022 6:49 PM    FL Fluoro < 1 Hour    Result Date: 09/05/2022   Fluoroscopic guidance provided without the presence of a radiologist. Gerlene BurdockJohn Lee, MD 09/05/2022 3:37 PM    XR Cervical Spine Flexion And Extension Only    Result Date: 08/28/2022  Findings and Impression: Lateral flexion and extension views performed. Stable displaced fracture of the ventral inferior corner of the C2 vertebral body 7 mm anterior displacement, and 48 degrees rotation/angulation of the fracture fragment. Fracture fragment does not change with flexion and extension, and no instability is seen. C5-6 and C6-7 moderate disc space narrowing with associated endplate and uncovertebral joint hypertrophic changes. ReadingStation:[id number]   Lab Results   Component Value Date/Time    HGB 7.2 (L) 09/06/2022  03:43 AM    HGB 12.4 08/07/2022 06:36 AM    HCT 22.6 (L) 09/06/2022 03:43 AM    K 3.9 09/06/2022 03:43 AM    K 3.4 (L) 08/07/2022 06:36 AM    NA 140 09/06/2022 03:43 AM    NA 144 08/07/2022 06:36 AM    INR 1.2 (H) 09/05/2022 05:34 PM    INR 1.0 06/09/2022 11:42 AM       Social History:   Prior Level of Function:  Prior level of function: Needs assistance with ADLs  Assistive Device: Wheelchair  Dressing - Upper Body: independent  Dressing - Lower Body: moderate assist  Feeding: independent  Bathing: moderate assist  Grooming: independent  Toileting: moderate assist  DME Currently at Home: ADL- 3-in-1 Bedside Commode, ADL- Paediatric nursehower Chair, Wheelchair, Manual, IT sales professionalTransport Chair, Other (Comment) (ADL reacher, hospital bed)    Home Living Arrangements:  Living Arrangements: Spouse/significant other, Children  Type of Home: Other (Comment) (daughter's home for better mobility access and for PRN assist)  Home Layout: Ramped entrance, Able to  live on main level with bedroom/bathroom  Bathroom Shower/Tub: Electrical engineer: Tub transfer Doctor, hospital: Accessible  DME Currently at Home: ADL- 3-in-1 Bedside Commode, ADL- Paediatric nurse, Wheelchair, Manual, IT sales professional, Other (Comment) (ADL reacher, hospital bed)    Subjective: "I have not done this since August"- referring to standing up    Patient's medical condition is appropriate for Occupational Therapy intervention at this time. Patient is agreeable to participation in the therapy session. Nursing clears patient for therapy.    Patient Goal: to get better    Pain:   Wong-Baker Scale (0-10): 5  Location: B LE  Intervention: RN notified of pt's request for pain medication    Objective:   Observation of Patient  Patient with peripheral IV, external catheter in place.  Patient wore mask during therapy session: No      Cognition/Neuro Status:  Arousal/Alertness: appropriate responses to stimuli  Attention span: appears  intact  Orientation Level: Oriented x4  Memory: appears intact  Follows commands: independent  Safety Awareness: min verbal instruction  Behavior: calm, cooperative, motivated    Musculoskeletal Examination:  RUE ROM: WFL  LUE ROM: WFL    RUE Strength: grossly assessed; >3/5  LUE Strength: grossly assessed; >3/5    Bed Mobility:   Rolling: NT    Supine to Sit: SBA  Sit to Supine: NT  Scooting to HOB: NT  Scooting to EOB: SBA    Functional Transfers:  Sit to Stand: MinA of 2 people w/ RW, increased time to complete 2/2 pain and anxiety   Stand to Sit: MinA of 2 people w/ RW, ncreased time to complete 2/2 pain and anxiety     PMP - Progressive Mobility Protocol   PMP Activity: (P) Step 5 - Chair      ADLs (Self Care) and Home Management:  Eating: supervision and set-up  Grooming: Min A and set-up  Bathing: Max A  UB Dressing: Min A and set-up (gown)  LB Dressing: Max A  Toileting: Max A  Functional mobility: MinA of 2 people w/ RW, 3-4 side steps     Balance:  Static Sitting: good  Dynamic Sitting: good  Static Standing: fair+  Dynamic Standing: fair    Sensation/Oculomotor:  Right UE: intact   Left UE: intact   Auditory: WFL = intact  Visual: WFL = intact  Light-touch: WFL = intact    Therapeutic Exercise:   Incorporated throughout session    Participation and Activity Tolerance  Participation Effort: good  Endurance: good-, limited by pain     Patient left with call bell within reach, all needs met,   SCD's on  Floor mat in place  Chair alarm in place  and all questions answered. RN notified of session outcome and patient response.     Tech present: n/a  PPE worn by tech: n/a    Goals:  Time For Goal Achievement: 5 visits  ADL Goals  Patient will dress lower body: Minimal Assist  Patient will toilet: Minimal Assist  Mobility and Transfer Goals  Pt will perform functional transfers: Contact Guard Assist  Neuro Re-Ed Goals  Pt will perform dynamic standing balance: Minimal Assist     Executive Fucntion Goals  Pt will  follow energy conservation techniques: independent, to increase ability to complete ADLs          Time of Treatment  OT Received On: 09/06/22  Start Time: 1330  Stop Time: 1410  Time  Calculation (min): 40 min    PPE worn during session: procedural mask and gloves      Mosetta Pigeon, OTS     Supervising therapist present in the room, guiding and directing the student in one on one service to the patient.  This supervising therapist is making the skilled judgment, and is responsible for the assessment and treatment of the patient.    Nigel Berthold, OTR/L Pager [phone]

## 2022-09-06 NOTE — UM Notes (Signed)
PATIENT NAME: Laura Mclaughlin,Laura Mclaughlin   DOB: October 06, 1961   PMH:  has a past medical history of Closed fracture of distal end of left femur, unspecified fracture morphology, initial encounter, Convulsions, Depression, Hypothyroidism, Lupus, and Restless leg syndrome.  PSH:  has a past surgical history that includes Hernia repair; Gastric bypass (N/A); ORIF, FEMUR, DISTAL (Left, 06/11/2022); ORIF, TIBIA PLATEAU (Right, 06/14/2022); APPLICATION, EXTERNAL FIXATOR, UPPER EXTREMITY (Right, 06/14/2022); ORIF, RADIUS, DISTAL (WRIST) (Right, 06/26/2022); and REMOVE, MODIFY, UPPER EXTREMITY EXTERNAL FIXATOR (Right, 06/26/2022).     09/05/22 1638  Admit to Inpatient  Once        Diagnosis: Closed Bicondylar Fracture Of Distal Femur, Left, With Nonunion, Subsequent Encounter   Level of Care: Acute   Patient Class: Inpatient         Admission date: 09/05/2022  Pt is a 61 y.o. female who arrived to the hospital for elective surgery.    Admission diagnosis: Closed fracture of distal end of left femur, unspecified fracture morphology, initial encounter [S72.402A]  Closed bicondylar fracture of distal femur, left, with nonunion, subsequent encounter [V40.981X, S72.432K]    Surgical Procedure(s) completed: Procedure(s):  LEFT DISTAL FEMUR NONUNION REPAIR, GRAFT  LEFT LOWER EXTREMITY HARDWARE REMOVAL    Indications for procedure: Please see my and my partners' consultation/notes for details.  The patient sustained a polytrauma with left distal femur fracture and underwent operative fixation by my partner in August 2023. 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      Estimated Blood Loss:   1000 mL    No drains  No complications       Postoperative plan:  Weight-bear as tolerated left lower extremity.  Range of motion as tolerated left lower extremity  24 hours antibiotics.  3 weeks DVT prophylaxis  Admission for pain control and discharge planning   _____________________________________________  09/06/2022    Laura Mclaughlin is a 61 y.o. 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       Oxygen:94% RA  Diet: general    VS:   I&O   Temp:  [97.1 F (36.2 C)-98.1 F (36.7 C)]   Heart Rate:  [67-92]   Resp Rate:  [15-21]   BP: (93-128)/(51-77)   SpO2:  [93 %-100 %]   Height:  [175.3 cm (5\' 9" )]   Weight:  [113.4 kg (250 lb)]   BMI (calculated):  [37]   I/O last 3 completed shifts:  In: 2950 [I.V.:1500; IV Piggyback:1450]  Out: 1000 [Blood:1000]        Abnormal Labs:  09/06/22 03:43  Hemoglobin: 7.2 (L)  Hematocrit: 22.6 (L)  RBC: 2.42 (L)  Glucose: 146 (H)  Chloride: 112 (H)  Calcium: 8.2 (L)    Medications:   Current Facility-Administered Medications   Medication Dose Route Frequency    acetaminophen  1,000 mg Oral Q8H SCH    enoxaparin  40 mg Subcutaneous Q24H SCH    gabapentin  300 mg Oral Q8H SCH    senna-docusate  2 tablet Oral BID      lactated ringers           Assessment: 61 y.o. female  1 Day Post-Op s/p L distal femur nonunion resection, ORIF with intramedullary retrograde nail.      Plan:              - Mobility: Out of bed as tolerated with PT/OT              - Pain control: Continue to wean/titrate to appropriate oral regimen              - DVT Prophylaxis (per Ortho Trauma service Protocol): Lovenox              - Foley catheter status: Does not have Foley              - Further surgical plans: No further Orthopaedic plans              - RUE: WBAT              - LUE:  WBAT              - RLE:  WBAT              - LLE:  WBAT              - No  transfusion indicated, but will continue to follow serial labs and titrate IV fluids & volume expanders as indicated to optimize hemodynamics/volume status.              - Disposition: pending PT/OT evaluation      Primary Payor: MEDICARE / Plan: MEDICARE PART A AND B / Product Type: Medicare /        Magda Bernheim, MSN, RN  Utilization Review   [facility] [employer]  [phone]   [phone]  Email: [email or web address]  NPI:   [id number]  Tax ID:  [id number]             NOTES TO REVIEWER:    This clinical review is based on/compiled from documentation provided by the treatment team within the patient's medical record.          Primary Payor: MEDICARE / Plan: MEDICARE PART A AND B / Product Type: Medicare /   UTILIZATION REVIEW CONTACT: Name: Magda Bernheim, MSN, RN  Utilization Review   Phone: [phone]  Fax: [phone]  Please use fax number [phone] to provide authorization for hospital services or to request additional information.        NOTES TO REVIEWER:    This clinical review is based on/compiled from documentation provided by the treatment team within the patient's medical record.

## 2022-09-06 NOTE — PT Progress Note (Signed)
Physical Therapy Treatment  Laura Mclaughlin  Post Acute Care Therapy Recommendations:     Discharge Recommendations:  Acute Rehab    If Acute Rehab recommended discharge disposition is not available, patient will need hands on assist for functional mobility and ADLs, SUPV, and HHPT.     DME needs IF patient is discharging home: No additional equipment/DME recommended at this time, Patient already has needed equipment    Therapy discharge recommendations may change with patient status.  Please refer to most recent note for up-to-date recommendations.    Patient anticipated to benefit from and to be able to engage in 3 hours of therapy a day for 5 days a week.     Assessment:   Significant Findings: none    Patient  seated in personal WC  upon arrival, daughter and RN present, all parties agreeable to physical therapy treatment session. This afternoon's tx session focused on functional transfers from [facility] to bed, lateral seated scooting, and STS transfers using FWW. Patient performs WC to bed transfer at SBA by scooting to the R, demonstrating good carryover of head-hips relationship from previous session. Upon sitting on EOB, pt reports feeling nauseous but is agreeable to attempt a STS and side steps toward HOB. Vitals stable t/o. Pt Mod Ax 2 using FWW to stand, and performs side steps to L w/ decreased foot clearance of R foot d/t difficulty fully accepting weight onto L leg 2/2 pain. Pt returned to supine and positioned for comfort. Patient will continue to benefit from inpatient PT services to maximize functional mobility and independence.    Patient left with all needs met and call bell within reach. RN notified of session outcome.     Treatment Activities: bed mobility, sit <> stand, gait, neuro reed, AD eval/training, transfers, therapeutic activity    Educated the patient to role of physical therapy, plan of care, goals of therapy and safety with mobility and ADLs, energy conservation techniques, pursed lip  breathing.    Plan:   Treatment/Interventions: Exercise, Gait training, Neuromuscular re-education, Functional transfer training, LE strengthening/ROM, Endurance training, Patient/family training, Equipment eval/education, Bed mobility        PT Frequency: 4-5x/wk     Continue plan of care.    Unit: [facility] TOWER 8  Bed: F847/F847.01     Precautions and Contraindications:   Falls  WBAT LLE, WBAT RLE  CTO brace cleared by neurosurgery     Updated Medical Status/Imaging/Labs:   Rad: reviewed  XR Femur Left AP And Lateral    Result Date: 09/05/2022  Status post internal fixation of the distal metadiaphyseal fracture with an intramedullary nail, with improved alignment. Laura Birks, MD 09/05/2022 6:49 PM     Subjective: "I'm so glad you came when you did." "I'll be walking again, I'm doing this."   Patient's medical condition is appropriate for Physical Therapy intervention at this time.  Patient is agreeable to participation in the therapy session. Nursing clears patient for therapy.    Pain Assessment:  Wong-Baker Scale (0-10): 5  Location: LLE and abdomen 2/2 nausea   Intervention: positioned for comfort    Objective:   Observation of Patient:   Patient received with peripheral IV access, bandages on LLE, chair alarm in place.  Patient wore mask during therapy session:No      Vital signs:  Stable with no signs/symptoms of distress    Cognition/Neuro Status:  Oriented x4    Functional Mobility:  PMP - Progressive Mobility Protocol  PMP Activity: Step 5 - Chair (chair to bed)  Distance Walked (ft) (Step 6,7): 0 Feet    Bed Mobility:   Rolling R: CGA  Rolling L : NT    Sit to Supine: Contact-guard assistance  Scooting to [facility]: NT    Transfers:  Sit to Stand: Moderate assistance of 2 people w/ FWW  Stand to Sit: Min A x 2   WC to bed: SBA via lateral scooting to R     Balance:  Static sitting: good   Dynamic sitting: good   Static standing: good - w/ outside support  Dynamic standing: fair + w/  outside support, tolerates 4-5 small side steps to L     Patient Participation: excellent  Patient Endurance: good    Patient left with call bell within reach, all needs met, and all questions answered. RN notified of session outcome and patient response.     SCD's off (as found)  Floor mat in place  Bed alarm on    Goals:  Goals  Goal Formulation: With patient, With patient/family  Time for Goal Acheivement: 7 visits  Goals: Select goal  Pt Will Go Supine To Sit: with supervision, to maximize functional mobility and independence, Goal met  Pt Will Perform Sit To Supine: with supervision, to maximize functional mobility and independence  Pt Will Perform Sit to Stand: with contact guard assist, to maximize functional mobility and independence (w/ FWW)  Pt Will Transfer Bed/Chair: with supervision, to maximize functional mobility and independence, Goal met (to WC)  Pt Will Ambulate: 51-100 feet, with rolling walker, with contact guard assist, to maximize functional mobility and independence  Pt Will Propel Wheelchair: > 150 feet, to maximize functional mobility and independence    PPE worn during session: procedural mask and gloves    Time of Treatment  PT Received On: 09/06/22  Start Time: 1515  Stop Time: 1540  Time Calculation (min): 25 min  Treatment # 1 out of 7 visits    Everlene Other PT, DPT  Pager# [phone]

## 2022-09-06 NOTE — Plan of Care (Signed)
4 eyes in 4 hours pressure injury assessment note:      Completed with: Ela Moffat  Unit & Time admitted: [facility] @ 2330             Bony Prominences: Check appropriate box; if wound is present enter wound assessment in LDA     Occiput:                 [x] WNL  []  Wound present  Face:                     [x] WNL  []  Wound present  Ears:                      [x] WNL  []  Wound present  Spine:                    [x] WNL  []  Wound present  Shoulders:             [x] WNL  []  Wound present  Elbows:                  [x] WNL  []  Wound present  Sacrum/coccyx:     [x] WNL  []  Wound present  Ischial Tuberosity:  [x] WNL  []  Wound present  Trochanter/Hip:      [x] WNL  []  Wound present  Knees:                   [x] WNL  []  Wound present  Ankles:                   [x] WNL  []  Wound present  Heels:                    [x] WNL  []  Wound present  Other pressure areas:  []  Wound location       Device related: []  Device name:         LDA completed if wound present: yes/no  Consult WOCN if necessary    Other skin related issues, ie tears, rash, etc, document in Integumentary flowsheet     Scar R knee, L hip surgical dressing

## 2022-09-06 NOTE — PT Eval Note (Signed)
IPhysical Therapy Evaluation  Laura Mclaughlin      Post Acute Care Therapy Recommendations:     Discharge Recommendations:  Acute Rehab    If Acute Rehab  recommended discharge disposition is not available, patient will need hands on assist for functional mobility and ADLs, SUPV, and HHPT.     DME needs IF patient is discharging home: No additional equipment/DME recommended at this time, Patient already has needed equipment    Therapy discharge recommendations may change with patient status.  Please refer to most recent note for up-to-date recommendations.    Patient anticipated to benefit from and to be able to engage in 3 hours of therapy a day for 5 days a week.     Assessment:   Significant Findings: LLE bandages saturated in blood, RN made aware and present to reinforce bandages.     Laura Mclaughlin is a 61 y.o. female admitted 09/05/2022.  Patient in bed upon arrival, daughter present, both parties agreeable to physical therapy evaluation. Patient presents with decreased activity tolerance, gait impairment, decreased strength of LLE 2/2 pain, impaired standing balance, and LLE pain. Currently pt performs supine to sit transfers at [facility], and is able to perform bed to manual WC transfer at SBA via lateral scooting. Pt verbalizes she has been non-ambulatory since trauma onset on 06/09/2022 2/2 and subsequent non-WB status. Pt is now WBAT BLE and demonstrates excellent progress since last admission in August, and will strongly benefit from intensive therapy to return to PLOF. Patient will continue to benefit from inpatient PT services to maximize functional mobility and independence.    Therapy Diagnosis: impaired functional mobility    Rehabilitation Potential: good    Treatment Activities: Evaluation, balance, functional transfers    Educated the patient to role of physical therapy, plan of care, goals of therapy and safety with mobility and ADLs.      Plan:   Treatment/Interventions: Exercise, Gait training,  Neuromuscular re-education, Functional transfer training, LE strengthening/ROM, Endurance training, Patient/family training, Equipment eval/education, Bed mobility     PT Frequency: 4-5x/wk   Risks/Benefits/POC Discussed with Pt/Family: With patient;With patient/family       Unit: [facility] TOWER 8  Bed: F847/F847.01     Precautions and Contraindications:   Falls  WBAT LLE, WBAT RLE  CTO brace cleared by neurosurgery     Consult received for Laura Mclaughlin for PT Evaluation and Treatment.  Patient's medical condition is appropriate for Physical therapy intervention at this time.    Medical Diagnosis: Closed fracture of distal end of left femur, unspecified fracture morphology, initial encounter [S72.402A]  Closed bicondylar fracture of distal femur, left, with nonunion, subsequent encounter [Z61.096E, S72.432K]      History of Present Illness:   Laura Mclaughlin is a 61 y.o. 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 [name], MD 09/05/2022 6:49 PM    FL Fluoro < 1 Hour    Result Date: 09/05/2022   Fluoroscopic guidance provided without the presence of a radiologist. Gerlene Burdock, MD 09/05/2022 3:37 PM    XR Cervical Spine Flexion And Extension Only    Result Date: 08/28/2022  Findings  and Impression: Lateral flexion and extension views performed. Stable displaced fracture of the ventral inferior corner of the C2 vertebral body 7 mm anterior displacement, and 48 degrees rotation/angulation of the fracture fragment. Fracture fragment does not change with flexion and extension, and no instability is seen. C5-6 and C6-7 moderate disc space narrowing with associated endplate and uncovertebral joint hypertrophic changes. ReadingStation:[email or web address]     Social History:   Prior Level of Function:  Assistive Device: Other (Comment) (dangle at bedside)  DME Currently at Home: Dan Humphreys, [employer], ADL- 3-in-1 Bedside Commode, IT sales professional, Wheelchair, Manual, Other (Comment), ADL- Reacher, Hospital Bed (slide board, tub trasnfer bench)    Home Living Arrangements:  Living Arrangements: Spouse/significant other  Type of Home: Other (Comment) (currently living at daughter's home for better mobility access and for PRN assist)  Home Layout: Ramped entrance, Able to live on main level with bedroom/bathroom  Bathroom Shower/Tub: Cabin crew: Tub transfer bench, Commode  DME Currently at Home: Environmental consultant, [employer], ADL- 3-in-1 Bedside Commode, IT sales professional, Wheelchair, Manual, Other (Comment), ADL- Reacher, Hospital Bed (slide board, tub trasnfer bench)    Subjective: "We'll have to stand this afternoon, won't we?"   Patient is agreeable to participation in the therapy session. Nursing clears patient for therapy.     Patient goal: return to PLOF    Pain Assessment:  Numeric Scale (0-10): 2  Location: LLE  Intervention: premedicated for pain by RN, positioned for comfort      Objective:   Observation of Patient:   Patient with peripheral IV, bandages on LLE, bed alarm in place.  Patient wore mask during therapy session: No      Vital signs:  Stable with no signs/symptoms of distress    Cognition/Neuro Status:  Oriented x4    Musculoskeletal Examination:  Gross ROM  RUE: within functional  limits  LUE: within functional limits  RLE: within functional limits  LLE: within functional limits    Gross Strength  RUE: 4+/5  LUE: 4+/5  RLE: 4+/5  LLE: 3+/5, limited by pain w/ resistance, DF and PF: 4/5    Functional Mobility:  PMP - Progressive Mobility Protocol   PMP Activity: Step 5 - Chair (propels WC outside of room)  Distance Walked (ft) (Step 6,7): 0 Feet    Bed Mobility:   Rolling: NT    Supine to Sit: Supervision  Sit to Supine: CGA  Scooting to HOB: NT  Scooting to EOB: Supervision    Transfers:  Sit to Stand: deferred 2/2 fear, pt agreeable to attempt this afternoon   Stand to Sit: deferred 2/2 pt agreeable to attempt this afternoon   Bed to WC: SBA via lateral scooting to R  WC to bed: SBA via lateral scooting to R    Ambulation:  NT    Self-propels manual WC 100 ft    Balance:  Static sitting: good   Dynamic sitting: good   Static  standing: not tested  Dynamic standing: not tested    Sensation:  Right UE: intact to light touch   Left UE: intact to light touch  Right LE: intact to light touch  Left LE: intact to light touch    Participation and Activity Tolerance:  Participation Effort: good  Patient Endurance: good    Patient left with call bell within reach, all needs met, and all questions answered    SCD's on RLE only   Floor mat in place  Bed alarm on    Goals:   Goals  Goal Formulation: With patient, With patient/family  Time for Goal Acheivement: 7 visits  Goals: Select goal  Pt Will Go Supine To Sit: with supervision, to maximize functional mobility and independence, Goal met  Pt Will Perform Sit To Supine: with supervision, to maximize functional mobility and independence  Pt Will Perform Sit to Stand: with contact guard assist, to maximize functional mobility and independence (w/ FWW)  Pt Will Transfer Bed/Chair: with supervision, to maximize functional mobility and independence, Goal met (to WC)  Pt Will Ambulate: 51-100 feet, with rolling walker, with contact guard assist, to maximize  functional mobility and independence  Pt Will Propel Wheelchair: > 150 feet, to maximize functional mobility and independence    PPE worn during session:  procedural mask and gloves    Time of treatment:   PT Received On: 09/06/22  Start Time: 1130  Stop Time: 1205  Time Calculation (min): 35 min    Everlene Other, PT, DPT  Pager [phone]

## 2022-09-07 DIAGNOSIS — S72402A Unspecified fracture of lower end of left femur, initial encounter for closed fracture: Secondary | ICD-10-CM

## 2022-09-07 LAB — CBC
Absolute NRBC: 0 10*3/uL (ref 0.00–0.00)
Hematocrit: 24.2 % — ABNORMAL LOW (ref 34.7–43.7)
Hgb: 7.7 g/dL — ABNORMAL LOW (ref 11.4–14.8)
MCH: 30.1 pg (ref 25.1–33.5)
MCHC: 31.8 g/dL (ref 31.5–35.8)
MCV: 94.5 fL (ref 78.0–96.0)
MPV: 11 fL (ref 8.9–12.5)
Nucleated RBC: 0 /100 WBC (ref 0.0–0.0)
Platelets: 242 10*3/uL (ref 142–346)
RBC: 2.56 10*6/uL — ABNORMAL LOW (ref 3.90–5.10)
RDW: 14 % (ref 11–15)
WBC: 7.74 10*3/uL (ref 3.10–9.50)

## 2022-09-07 LAB — ANTIBODY DIFF. XM MD REVIEW CPT 86077 - REFLEX

## 2022-09-07 MED ORDER — GABAPENTIN 300 MG PO CAPS
300.0000 mg | ORAL_CAPSULE | Freq: Three times a day (TID) | ORAL | 0 refills | Status: DC
Start: 2022-09-07 — End: 2023-09-24

## 2022-09-07 MED ORDER — ASPIRIN 81 MG PO TBEC
81.0000 mg | DELAYED_RELEASE_TABLET | Freq: Every day | ORAL | 0 refills | Status: AC
Start: 2022-09-26 — End: 2022-10-17

## 2022-09-07 MED ORDER — LAMOTRIGINE 100 MG PO TABS
300.0000 mg | ORAL_TABLET | Freq: Every day | ORAL | Status: DC
Start: 2022-09-07 — End: 2022-09-08
  Administered 2022-09-07 – 2022-09-08 (×2): 300 mg via ORAL
  Filled 2022-09-07 (×2): qty 3

## 2022-09-07 MED ORDER — LEVOTHYROXINE SODIUM 50 MCG PO TABS
75.0000 ug | ORAL_TABLET | Freq: Every day | ORAL | Status: DC
Start: 2022-09-08 — End: 2022-09-08
  Administered 2022-09-08: 75 ug via ORAL
  Filled 2022-09-07: qty 1

## 2022-09-07 MED ORDER — CLOBAZAM 10 MG PO TABS
20.0000 mg | ORAL_TABLET | Freq: Every evening | ORAL | Status: DC
Start: 2022-09-07 — End: 2022-09-08
  Administered 2022-09-07: 20 mg via ORAL
  Filled 2022-09-07 (×2): qty 2

## 2022-09-07 MED ORDER — ACETAMINOPHEN 500 MG PO TABS
1000.0000 mg | ORAL_TABLET | Freq: Three times a day (TID) | ORAL | Status: DC
Start: 2022-09-07 — End: 2022-11-27

## 2022-09-07 MED ORDER — NALOXONE HCL 4 MG/0.1ML NA LIQD
NASAL | 0 refills | Status: DC
Start: 2022-09-07 — End: 2023-08-27

## 2022-09-07 MED ORDER — TOPIRAMATE 25 MG PO TABS
50.0000 mg | ORAL_TABLET | Freq: Two times a day (BID) | ORAL | Status: DC
Start: 2022-09-07 — End: 2022-09-08
  Administered 2022-09-07 – 2022-09-08 (×2): 50 mg via ORAL
  Filled 2022-09-07 (×4): qty 2

## 2022-09-07 MED ORDER — OXYCODONE HCL 5 MG PO TABS
5.0000 mg | ORAL_TABLET | ORAL | 0 refills | Status: AC | PRN
Start: 2022-09-07 — End: 2022-09-14

## 2022-09-07 MED ORDER — METHOCARBAMOL 500 MG PO TABS
1000.0000 mg | ORAL_TABLET | Freq: Four times a day (QID) | ORAL | Status: DC
Start: 2022-09-07 — End: 2022-09-08
  Administered 2022-09-07 – 2022-09-08 (×3): 1000 mg via ORAL
  Filled 2022-09-07 (×3): qty 2

## 2022-09-07 MED ORDER — ENOXAPARIN SODIUM 40 MG/0.4ML IJ SOSY
40.0000 mg | PREFILLED_SYRINGE | INTRAMUSCULAR | 0 refills | Status: AC
Start: 2022-09-07 — End: 2022-09-26

## 2022-09-07 NOTE — Nursing Progress Note (Deleted)
Procedure(s):  LEFT DISTAL FEMUR NONUNION REPAIR WITH IM NAIL  LEFT LOWER EXTREMITY HARDWARE REMOVAL     2 Days Post-Op  -------------------            Pt A&O X4. VSS.  Tolerated whole pills with thin liquids.   Encouraged IS use.   Continent of bowel and bladder. LBM 09/05/22 Pt voiding via ex cath  Pain Controlled with Oxycodone  Good pedal pulses bilaterally  Dressings clean, dry, and intact  Call bell and bedside table within reach at all times.   Notable Shift Events  Repositioned Q2 hours, Pt NPO pending return to OR on 09/07/22     Laura Lehane M Leoncio Hansen, RN  09/07/22  5:34 AM

## 2022-09-07 NOTE — Plan of Care (Signed)
Procedure(s):  LEFT DISTAL FEMUR NONUNION REPAIR WITH IM NAIL  LEFT LOWER EXTREMITY HARDWARE REMOVAL    2 Days Post-Op  -------------------     Patient Lines/Drains/Airways Status       Active Lines, Drains and Airways       Name Placement date Placement time Site Days    Peripheral IV 09/05/22 20 G Anterior;Proximal;Right Forearm 09/05/22  0923  Forearm  2    External Urinary Catheter 09/05/22  2045  --  1                  Pt A&O X4. VSS.   Tolerated whole pills with thin liquids.  Pt able to ambulate to chair with x2 assist and walker.   Encouraged TCDB and IS use.   Continent of bowel and bladder. LBM 11/01. Pt voiding via external cath.  Pain Controlled with oxycodone 10 mg PRN and scheduled medications.   Good pedal pulses bilaterally,cap refill <3 sec in all 4 extremities.  All dressings are clean, dry, and intact and all sites appear free from visual signs of infection.    Call bell and bedside table within reach at all times. Patient expressed no further needs or concerns at this time.      Notable Shift Events  >>>  1124: Pt complain of nausea. RN gave zofran and cold washcloth for face.     Plan: D/C to Acute Rehab     Most Recent Set of Vitals   Visit Vitals  BP 121/79   Pulse (!) 58   Temp 98.1 F (36.7 C) (Oral)   Resp 16   Ht 1.753 m (5\' 9" )   Wt 113.4 kg (250 lb)   SpO2 94%   BMI 36.92 kg/m           Leotis Shames, RN  09/07/22  3:00 PM

## 2022-09-07 NOTE — Consults (Signed)
Consult received for CM/SW. Please see below assessment.     Initial Case Management Assessment and Discharge Planning      Patient Name: Laura Mclaughlin, Laura Mclaughlin   Date of Birth Nov 04, 1961   Attending Physician: Gregery Na, MD   Primary Care Physician: Ladona Ridgel, NP   Length of Stay 2   Reason for Consult / Chief Complaint Closed fracture of left distal femur         Situation   Admission DX:   1. Closed bicondylar fracture of distal femur, left, with nonunion, subsequent encounter        A/O Status: X 3    LACE Score: 4    Patient admitted from: ER  Admission Status: inpatient    Health Care Agent: Self  Name: Nelda Bucks  Phone number:        Background     Advanced directive:   Not Received    has NO advance directive - not interested in additional information    Code Status:   Full Code     Residence: One story home/apartment    PCP: Ladona Ridgel, NP  Patient Contact:   There are no phone numbers on file.    [phone] (mobile)     Emergency contact:   Extended Emergency Contact Information  Primary Emergency Contact: Brummitt,BRITTANY  Mobile Phone: [phone]  Relation: Daughter  Secondary Emergency Contact: Loney,MARK  Mobile Phone: [phone]  Relation: Spouse      ADL/IADL's: Assistive Device  Previous Level of function: 7 Independent     DME:  w/c, hospital bed, reacher, shower chair, walker, transfer bench    Pharmacy:     [facility] [address] [place] - [address]  [address] [place] [address]  Phone: [phone] Fax: [phone]      Prescription Coverage: Yes    Home Health: The patient is receiving home health services, including a visiting nurse, occupational therapy, and physical therapy.    Previous SNF/AR: [facility]    COVID Vaccine Status: Y    Date First IMM given: 2021  UAI on file?:   Transport for discharge? Darron Doom of transportation: [employer]  Agreeable to Acute Rehab: [facility]  post-discharge:  Yes     Assessment   Met with  patient at bedside. Patient AAO x 4. Introduced self and role. Patient demographics confirmed. Patient resides with spouse in single story home. Patient states she will reside with daughter during recovery time. Patient recc'd for AR and in agreement. Patient would like [facility]. Outreach made to [facility] manager [phone]) who advised clinicals will need to reviewed for acceptance. Coordinator advised bed may be available tomorrow. Weekend CM to f/u with [facility] weekend coordinator regarding acceptance and bed availability.    BARRIERS TO DISCHARGE: None     Recommendation   D/C Plan A: Acute Rehab    D/C Plan B: SNF    D/C Plan C: Home with home health

## 2022-09-07 NOTE — OT Progress Note (Signed)
Occupational Therapy Treatment  Laura Mclaughlin      Post Acute Care Therapy Recommendations:     Discharge Recommendations:  Acute Rehab      If the above recommendation is not available, the pt will need hands on assist for ALL mobility and for ADLs, HHA, HHOT.    DME needs IF patient is discharging home: Patient already has needed equipment    Therapy discharge recommendations may change with patient status.  Please refer to most recent note for up-to-date recommendations.    Assessment:   Significant Findings: pt reports nausea after movement, premedicated by RN     Pt received in bed and was agreeable to OT. Pt required SBA for supine <> sit transfers, HOB up, MinA of 2 people w/ RW for sit <> stand transfers, and Mod of 2 people w/ RW while ambulating to wheelchair, 3-4 side steps; patient limited by fatigue, endurance, activity tolerance, pain, nausea, weakness, balance, dizziness, and shortness of breath.  Pt anticipated to be able to participate in 3 hours of therapy a day, very motivated. Pt required supervision and set-up for Eating/drinking, Min A for UB dressing and Max A for LB dressing. Pt educated on importance of seated ADLs, energy conservation techniques, and pacing for increased independence with verbal understanding. Pt would benefit from continued skilled OT to address these current limitations and facilitate return to PLOF.     Patient left without needs and call bell within reach. RN notified of session outcome.     Treatment Activities: balance, transfers, energy conservations techniques, therapeutic activity, patient education, adl retraining    Educated the patient to role of occupational therapy, plan of care, goals of therapy and safety with mobility and ADLs, energy conservation techniques, pursed lip breathing, home safety.    Plan:   OT Frequency Recommended: 4-5x/wk    Continue plan of care.      Unit: [facility] TOWER 8  Bed: F847/F847.01       Precautions and  Contraindications:   Falls  WBAT LLE, WBAT RLE  CTO brace cleared by neurosurgery    Updated Medical Status/Imaging/Labs:   Rad: reviewed  No results found.  Lab Results   Component Value Date/Time    HGB 7.7 (L) 09/07/2022 03:33 AM    HGB 12.4 08/07/2022 06:36 AM    HCT 24.2 (L) 09/07/2022 03:33 AM    K 3.9 09/06/2022 03:43 AM    K 3.4 (L) 08/07/2022 06:36 AM    NA 140 09/06/2022 03:43 AM    NA 144 08/07/2022 06:36 AM    INR 1.2 (H) 09/05/2022 05:34 PM    INR 1.0 06/09/2022 11:42 AM     Subjective: "I can try it, I want to get better"   Patient's medical condition is appropriate for Occupational Therapy intervention at this time.  Patient is agreeable to participation in the therapy session. Nursing clears patient for therapy.    Pain:   Numeric Scale (0-10): 4  Location:  LLE and abdomen 2/2 nausea    Intervention: premedicated for pain by RN, positioned for comfort     Objective:   Observation of Patient  Patient with peripheral IV, external catheter in place.  Patient wore mask during therapy session: No      Vital signs:  BP after activity: 118/63 mmHg    Cognition/Neuro Status:  Arousal/Alertness: appropriate responses to stimuli  Attention span: appears intact  Orientation Level: Oriented x4  Memory: appears intact  Follows commands:  independent  Safety Awareness: mod verbal instruction  Behavior: calm, cooperative, motivated    Bed Mobility:   Rolling: NT    Supine to Sit: SBA  Sit to Supine: NT  Scooting to HOB: NT  Scooting to EOB: MinA    Transfers:  Sit to Stand: MinA of 2 people w/ RW, increased time to complete 2/2 pain and nausea   Stand to Sit: MinA of 2 people w/ RW, increased time to complete 2/2 pain and nausea     PMP - Progressive Mobility Protocol   PMP Activity: (P) Step 5 - Chair      Balance  Static Sitting: good  Dynamic Sitting: good  Static Standing: fair  Dynamic Standing: NT    Self Care and Home Management:  Eating: supervision and set-up seated at wheelchair   Bathing: Max A  UB  Dressing: Min A and set-up (gown)  LB Dressing: Max A  Toileting: Max A  Functional mobility: ModA of 2 people w/ RW, increased time to complete 2/2 pain and nausea     Therapeutic Exercise:   Incorporated throughout session         Participation and Activity Tolerance  Participation Effort: good  Endurance: fair+    Patient left with call bell within reach, all needs met,   SCD's off (as found)  Floor mat in place  Chair alarm in place  and all questions answered. RN notified of session outcome and patient response.     Goals:  Time For Goal Achievement: 5 visits  ADL Goals  Patient will dress lower body: Minimal Assist  Patient will toilet: Minimal Assist  Mobility and Transfer Goals  Pt will perform functional transfers: Contact Guard Assist  Neuro Re-Ed Goals  Pt will perform dynamic standing balance: Minimal Assist     Executive Fucntion Goals  Pt will follow energy conservation techniques: independent, to increase ability to complete ADLs        Tech present: n/a  PPE worn by tech: n/a    PPE worn during session: procedural mask and gloves    Time of Treatment  OT Received On: 09/07/22  Start Time: 1200  Stop Time: 1235  Time Calculation (min): 35 min  Treatment # 1 of 5 visits      Mosetta Pigeon, OTS     Supervising therapist present in the room, guiding and directing the student in one on one service to the patient.  This supervising therapist is making the skilled judgment, and is responsible for the assessment and treatment of the patient.  Nigel Berthold, OTR/L Pager [phone]

## 2022-09-07 NOTE — Discharge Summary (Signed)
Discharge Summary    Date Time: 09/07/22 4:01 PM  Patient Name: Laura Mclaughlin  Attending Physician: Gregery Na, MD  Service:  1: Trauma/Acute Care Surgery    Date of Admission:   09/05/2022    Date of Discharge:    09/07/2022    Reason for Admission:   Closed fracture of distal end of left femur, unspecified fracture morphology, initial encounter [S72.402A]  Closed bicondylar fracture of distal femur, left, with nonunion, subsequent encounter [I69.629B, S72.432K]    Problems:   Lists the present on admission hospital problems  Present on Admission:   Closed bicondylar fracture of distal femur, left, with nonunion, subsequent encounter      Hospital Problems:  Principal Problem:    Closed fracture of left distal femur  Active Problems:    Closed bicondylar fracture of distal femur, left, with nonunion, subsequent encounter      Discharge Dx:     1. Closed bicondylar fracture of distal femur, left, with nonunion, subsequent encounter        Consultations:   None    Procedures performed:   L femur HWR and ORIF with intramedullary nail     HPI:   Laura Mclaughlin is 61 y.o. 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  Secondary to ongoing pain during ambulation and inability to perform activities of daily living it was deemed necessary to proceed with surgical intervention at this time.    Hospital Course:   On 11-1 she presented to the operating room and a hardware removal with an open reduction and internal fixation and retrograde intramedullary nail of the left femur was performed.  She was admitted for postoperative pain control.  On postop day 1 she was evaluated by physical therapy and Occupational Therapy who recommended a discharge to acute rehab.  Throughout admission her pain was well controlled and vital signs stable throughout.  The postoperative incision was additionally inspected on postop day 2 which was noted to  be well appearing without overt signs of clinical infection.   At the time of discharge the patient was afebrile and her vital signs were within normal limits. she was ambulatory and able to void spontaneously without any difficulty.  she was tolerating a diet and her pain was well-controlled with oral medication.  Discharge Medications:        Medication List        START taking these medications      aspirin EC 81 MG EC tablet  Take 1 tablet (81 mg) by mouth daily for 21 days  Start taking on: September 26, 2022     enoxaparin 40 MG/0.4ML syringe  Commonly known as: LOVENOX  Inject 0.4 mLs (40 mg) into the skin every 24 hours for 19 days     Meloxicam 7.5 MG Tbdp     naloxone 4 MG/0.1ML nasal spray  Commonly known as: NARCAN  1 spray intranasally. If pt does not respond or relapses into respiratory depression call 911. Give additional doses every 2-3 min.     oxyCODONE 5 MG immediate release tablet  Commonly known as: ROXICODONE  Take 1 tablet (5 mg) by mouth every 4 (four) hours as needed for Pain            CHANGE how you take these medications      gabapentin 300 MG capsule  Commonly known as: NEURONTIN  Take 1 capsule (300 mg)  by mouth every 8 (eight) hours  What changed: how much to take     * topiramate 50 MG tablet  Commonly known as: TOPAMAX  Take 3 tablets (150 mg) by mouth every 12 (twelve) hours  What changed: how much to take     * topiramate 50 MG tablet  Commonly known as: TOPAMAX  What changed: Another medication with the same name was changed. Make sure you understand how and when to take each.           * This list has 2 medication(s) that are the same as other medications prescribed for you. Read the directions carefully, and ask your doctor or other care provider to review them with you.                CONTINUE taking these medications      acetaminophen 500 MG tablet  Commonly known as: TYLENOL  Take 2 tablets (1,000 mg) by mouth every 8 (eight) hours     b complex-vitamin c-folic acid 0.8 MG  Tabs  Take 1 tablet by mouth daily     bisacodyl 10 mg suppository  Commonly known as: DULCOLAX  Place 1 suppository (10 mg) rectally daily as needed for Constipation     Briviact 50 MG Tabs tablet  Generic drug: Brivaracetam     calcium citrate-vitamin D 315-6.25 MG-MCG Tabs  Commonly known as: CITRACAL+D  Take 1 tablet by mouth 3 (three) times daily     cloBAZam 20 MG Tabs     Desvenlafaxine ER 100 MG Tb24     diazePAM 5 MG tablet  Commonly known as: VALIUM     lamoTRIgine 150 MG tablet  Commonly known as: LaMICtal  Take 2 tablets (300 mg) by mouth daily     levothyroxine 75 MCG tablet  Commonly known as: SYNTHROID     Methocarbamol 1000 MG Tabs  Take 1 tablet (1,000 mg) by mouth 4 (four) times daily     ondansetron 4 MG disintegrating tablet  Commonly known as: ZOFRAN-ODT     pantoprazole 40 MG tablet  Commonly known as: PROTONIX     Rotigotine 3 MG/24HR Pt24  Place 1 patch (3 mg) onto the skin every 24 hours     senna-docusate 8.6-50 MG per tablet  Commonly known as: PERICOLACE  Take 1 tablet by mouth every 12 (twelve) hours     traMADol 100 MG 24 hr tablet  Commonly known as: ULTRAM-ER     vitamin B-12 1000 MCG tablet  Commonly known as: CYANOCOBALAMIN     vitamin D (ergocalciferol) 50000 UNIT Caps  Commonly known as: DRISDOL     vitamins/minerals Tabs  Take 1 tablet by mouth 2 (two) times daily               Where to Get Your Medications        These medications [facility] sent to [facility] [address], [place] - [address]  [address], [place] [address]      Phone: [phone]   naloxone 4 MG/0.1ML nasal spray       You can get these medications from any pharmacy    Bring a paper prescription for each of these medications  gabapentin 300 MG capsule  oxyCODONE 5 MG immediate release tablet       Information about where to get these medications is not yet available    Ask your nurse or doctor about these medications  acetaminophen 500 MG tablet  aspirin EC 81 MG EC tablet  enoxaparin 40 MG/0.4ML  syringe         Disposition:   Acute Rehab     Discharge Instructions:     [facility]  [address]  [place], [place] [address]  Phone: [phone]  Fax [phone]    DISCHARGE INSTRUCTIONS:  Date of Admission: 09/05/2022   Admission Diagnosis: Closed fracture of distal end of left femur, unspecified fracture morphology, initial encounter [S72.402A]  Closed bicondylar fracture of distal femur, left, with nonunion, subsequent encounter [Z30.865H, S72.432K]   Date of Most Recent Surgery: 09/05/2022 Procedure(s) (LRB):  LEFT DISTAL FEMUR NONUNION REPAIR WITH IM NAIL (Left)  LEFT LOWER EXTREMITY HARDWARE REMOVAL (Left)    Your first appointment has been scheduled with Gregery Na, MD. No future appointments. If you are unable to keep your appointment or need to make changes, please call the office as soon as possible.  - Please plan to arrive at least 15 minutes prior to your appointment time to allow yourself time for parking and check-in.   - If needed, we will obtain x-rays at your first orthopaedic follow-up appointment. X-rays do not need to be done prior to your appointment unless you have certain types of insurance or have been instructed to do so (if you have multiple injuries). If you have questions, please contact our office for guidance.  - If you underwent surgery, your incision will be examined at your first follow-up appointment and typically, staples/sutures will be removed.  - Please check with your insurance to see if a referral is needed prior to your first visit to avoid any delays.  - Your insurance is noted as: Payor: MEDICARE / Plan: MEDICARE PART A AND B / Product Type: Medicare / . Please contact our office if this needs to be changed.    Activity / Weight Bearing Status:  Weightbearing as tolerated on the Left Lower Extremity      Wound Care:  - If you notice increased drainage, incision redness, or foul smell from the incision/wound and/or develop a  fever, please inform the office immediately and/or proceed directly to the Emergency Department for prompt care.  - Soft Dressings (no splint or cast): You may shower 3 days after surgery (no baths or submerging incision) unless otherwise instructed. Do not scrub incision, and pat the incision dry with a clean towel.  You may change dressings as needed with clean gauze and tape or ACE bandages, unless given specific instructions.    Medications:  -In order to minimize narcotic use and establish better pain control, we employ a multimodal pain approach. Dosing of medications will vary from patient to patient based on your specific needs, allergies and past medical history, so please refer to your discharge medication list.   - Pain management protocol utilizes several medications all listed below:    - Tylenol: Tylenol (acetaminophen) can be obtained over-the-counter. It is important limit dosage to no more than 3000 mg in a 24 hr period. Patients with liver problems are advised to refrain from taking until discussed with primary care provider. Usual dosing for adults is 1000mg  every 8 hrs for the first 2-3 weeks, then as needed.  For children, we recommend weight based dosing as directed on the medication packaging.  Prior to adding Tylenol, please ensure that other pain medications do not contain tylenol.   - Gabapentin: Please take Gabapentin (neurontin) as instructed three times per day, since this medication  works best when taken as scheduled, not as needed. Gabapentin should not be stopped abruptly as it may cause withdrawal. Use of this medication will help pain control by a different mechanism than narcotics and also help you wean off the narcotics more quickly. Most patients will take this med for 2-4 weeks.  To wean off of medication, stop taking the mid-day dose for 3 days, then stop taking the Morning dose for 3 days, and finally, stop taking the evening dose.   - Narcotics:  (Oxycodone/Dilaudid/Hydrocodone/Tramadol/etc.): This class of medications are prescribed opioid medications and should only be taken as instructed. It is also important to be on a consistent bowel regimen (see below) when taking narcotic medications, as constipation is a common side effect. If prescribed a dose range, recommend taking the lowest dose for moderate pain and the higher dose for severe pain. Most patients will take these medications for about a week, possibly two weeks and Narcotics should be the first medication that you wean off of taking. It is important that you DO NOT operate a motor vehicle or make important decisions while taking these meds.  Finally, avoid consuming alcohol or other medications that are sedating or could affect your mental status while taking narcotics.  - Bowel Regimen: Take stool softeners (ex. Colace) as long as you are taking narcotic pain medicines. If you are unable to have a bowel movement every 2-3 days, please proceed with over the counter laxatives (Senokot -S/Miralax), enemas, or suppositories for constipation relief.  - NSAIDs: (Motrin, Advil, Ibuprofen, Naprosyn, Aleve) - *If you are enrolled in NSAID study, please follow study instructions above depending on if you are in YES NSAID arm of the study vs the NO NSAID arm of the study*.  NSAIDs can be found over the counter.  There are occasional fractures that are notorious for slow healing, in which case you will be advised against using this class of medications. Adults: Recommend Ibuprofen 600mg  every 8 hours for the first 2-3 weeks (taken with food) then you may wean as tolerated. Discontinue if you experience stomach upset. Children: Recommend using Motrin dosed per the child's weight as instructed on the packaging. If you have Kidney problems, DO NOT take this medication until discussing with your primary care provider.  These medicines cannot be taken if you are pregnant.   Stomach Ulcer Prevention: Ongoing use  of NSAID medications can be very irritating to your stomach, potentially leading to gastritis or ulcers.  While taking NSAID's, we recommend that you also take Nexium (Esomeprazole) 20mg  or Prevacid (Lansoprazole) 15 mg daily while on NSAID's, both of which are over the counter medications.  Blood Clot Prevention:  - You may have been prescribed a blood thinner such as Lovenox (Enoxaparin), Heparin, Aspirin, or Xarelto (Rivaroxaban) to reduce the risk of a blood clot (deep vein thrombosis; DVT).  The type of medicine and length or preventative treatment is based on you injury type and procedure performed.   - Typically for pelvis and hip fractures, lovenox is prescribed for 3 weeks followed by a baby aspirin (81 mg) by mouth twice per day for 3 more weeks.  - Typically for femur (thigh), knee, lower leg, ankle, and foot fractures, baby aspirin (81 mg) twice per day for 3 weeks is prescribed.  - Patients with additional injuries (head, chest, spine, abdomen), wheelchair bound for long periods of time, bleeding disorders, and other medical problems may have adjusted medications and doses.  - If you have been prescribed lovenox  and are not able to fill it, we suggest that you take 1 baby aspirin (81 mg) by mouth twice daily (every 12 hours).     Diet:  -  Please resume your normal diet. It is common to have a decreased appetite for the first 1-2 weeks or following your injury/surgery. This may be due to pain and the pain medications that you are taking. Please be sure to drink plenty of fluids during this time to remain hydrated.     X-Rays (Radiology Studies):   - If you obtain x-rays (or MRI or CT scans) anywhere other than our office, please make sure to bring the actual images (on a CD) to the office visit. A radiology report is not adequate - your provider must visualize the actual images to accurately determine the plan of care.   - Prior to your first clinic visit, please call your insurance and/or our office to  ensure that you have insurance coverage to obtain x-rays in our office. If you are unable to obtain them in our office at your clinic visit, we will assist you in finding an imaging facility that accepts your healthcare plan.    Physical/Occupational Therapy:  - Physical or occupational therapy may be prescribed for rehabilitation during your recovery. The [facility] are all part of the same healthcare system as [facility] and our office. These centers accept most health insurance plans, but we encourage checking with your insurance plan to ensure appropriate coverage.    We hope that you have experienced excellent care from our team at [facility]. We continuously strive to improve and offer state of the art and world class care to our patients.  If you would like to express your gratitude or make a donation to support [facility]'s efforts, please contact us at: [phone] or [email or web address].    For emotional support resources after your traumatic injury, please contact the [facility] Coordinator, Adele Schilder, at [phone] or [email or web address]        MyChart - Take control of your medical records    MyChart allows you immediate, secure and confidential access to your medical records.  Patients like you use MyChart's Electronic Medical Record for better access to:  Appointment Scheduling - Make an appointment with your existing provider and manage your appointments.  After-Visit Summaries - View a summary of your appointments with instructions and comments from your provider.  Communicate with Your Provider's Team - Correspond directly with your provider about your health or refill a prescription anywhere/anytime.      Sign up using three easy steps:   Please go to [email or web address]           Click on Sign up now to access the new member sign up page  Enter your unique MyChart  Access Code exactly as it appears below to complete the sign-up process.  If you experience issues with activating your account, the information you have provided does not completely match our records. Please contact your provider's office or any Hospital Registration Department to correct the information required.   MyChart Access Code: Activation code not generated  Current MyChart Status: Active      Having technical trouble with MyChart?   Call and select "4" to speak to a representative.  [phone])     Follow Up:   No follow-up provider specified.    Signed by: Nathanial Millman, DPM    At the time of discharge  the patient was given instructions detailing discharge care and time (30 minutes) was taken to answer questions.

## 2022-09-07 NOTE — Plan of Care (Signed)
Problem: Moderate/High Fall Risk Score >5  Goal: Patient will remain free of falls  Outcome: Progressing     Problem: Hip Surgery  Goal: Free from Infection  Outcome: Progressing  Goal: Nutritional Intake is Adequate  Outcome: Progressing  Goal: Neurovascular Status is Stable  Outcome: Progressing  Goal: Hemodynamic Stability  Outcome: Progressing  Goal: Mobility/activity is maintained at optimum level for patient  Outcome: Progressing  Goal: Pain at adequate level as identified by patient  Outcome: Progressing  Goal: Address patient self-management plan  Outcome: Progressing  Goal: Patient/Patient Care Companion demonstrates understanding of disease process, treatment plan, medications, and discharge plan  Outcome: Progressing

## 2022-09-07 NOTE — PT Progress Note (Signed)
Physical Therapy Treatment  [name]  Post Acute Care Therapy Recommendations:     Discharge Recommendations:  Acute Rehab    If Acute Rehab  recommended discharge disposition is not available, patient will need hands on assist for functioanl mobility and HHPT.     DME needs IF patient is discharging home: Patient already has needed equipment    Therapy discharge recommendations may change with patient status.  Please refer to most recent note for up-to-date recommendations.    Patient anticipated to benefit from and to be able to engage in 3 hours of therapy a day for 5 days a week.      Assessment:   Significant Findings: none    Pt received in bed, agreeable to PT treatment session. LE therex in bed prior to mobility. Pt performed lateral scooting along EOB, forward weight shifting w focus on use of momentum in prep for sit<>stand transfers, and side steps along EOB. Pt required mod-max A for sit<>stand transfers. Session limited by worsening nausea with activity. Pt repositioned in bed for comfort w LLE elevated and supported. Pt will continue to benefit from skilled physical therapy in the acute care setting to address noted limitations and progress towards PLOF.    Assessment: Decreased LE ROM, Decreased UE strength, Decreased LE strength, Decreased endurance/activity tolerance, Decreased functional mobility, Decreased balance, Gait impairment, Impaired coordination  Progress: Progressing toward goals  Prognosis: Good, With continued PT status post acute discharge  Risks/Benefits/POC Discussed with Pt/Family: With patient, With patient/family  Patient left without needs and call bell within reach. RN notified of session outcome.     Treatment Activities: bed mobility, sit <> stand, gait, ther ex, transfers, patient education    Educated the patient to role of physical therapy, plan of care, goals of therapy and HEP, safety with mobility and ADLs, energy conservation techniques, discharge  instructions.    Plan:   Treatment/Interventions: Exercise, Gait training, Neuromuscular re-education, Functional transfer training, LE strengthening/ROM, Endurance training, Patient/family training, Equipment eval/education, Bed mobility        PT Frequency: 4-5x/wk     Continue plan of care.    Unit: [facility] TOWER 8  Bed: F847/F847.01     Precautions and Contraindications:   Precautions  Weight Bearing Status: RLE WBAT;LLE WBAT  Other Precautions: falls    Updated Medical Status/Imaging/Labs:   XR Femur Left AP And Lateral    Result Date: 09/05/2022  Status post internal fixation of the distal metadiaphyseal fracture with an intramedullary nail, with improved alignment. [name], MD 09/05/2022 6:49 PM    FL Fluoro < 1 Hour    Result Date: 09/05/2022   Fluoroscopic guidance provided without the presence of a radiologist. Gerlene Burdock, MD 09/05/2022 3:37 PM    XR Cervical Spine Flexion And Extension Only    Result Date: 08/28/2022  Findings and Impression: Lateral flexion and extension views performed. Stable displaced fracture of the ventral inferior corner of the C2 vertebral body 7 mm anterior displacement, and 48 degrees rotation/angulation of the fracture fragment. Fracture fragment does not change with flexion and extension, and no instability is seen. C5-6 and C6-7 moderate disc space narrowing with associated endplate and uncovertebral joint hypertrophic changes. ReadingStation:[id number]   Lab Results   Component Value Date/Time    HGB 7.7 (L) 09/07/2022 03:33 AM    HGB 12.4 08/07/2022 06:36 AM    HCT 24.2 (L) 09/07/2022 03:33 AM    K 3.9 09/06/2022 03:43 AM  K 3.4 (L) 08/07/2022 06:36 AM    NA 140 09/06/2022 03:43 AM    NA 144 08/07/2022 06:36 AM    INR 1.2 (H) 09/05/2022 05:34 PM    INR 1.0 06/09/2022 11:42 AM       Subjective: "That felt easier today"   Patient Goal: to walk    Pain Assessment  Pain Assessment: Numeric Scale (0-10)  Pain Score: 3-mild pain  Pain Location: Leg  Pain  Orientation: Left  Pain Intervention(s): Medication (See eMAR);Repositioned;Ambulation/increased activity;Rest    Patient's medical condition is appropriate for Physical Therapy intervention at this time.  Patient is agreeable to participation in the therapy session. Family and/or guardian are agreeable to patient's participation in the therapy session. Nursing clears patient for therapy.    Objective:   Observation of Patient/Vital Signs:  Patient is in bed with dressings and female external catheter in place.     Pt wore mask during therapy session:No      Cognition/Neuro Status  Arousal/Alertness: Appropriate responses to stimuli  Attention Span: Appears intact  Orientation Level: Oriented X4  Memory: Appears intact  Following Commands: Follows all commands and directions without difficulty  Safety Awareness: minimal verbal instruction  Insights: Fully aware of deficits  Behavior: attentive;calm;cooperative  Orientation Level: Oriented X4       Functional Mobility:  Supine to Sit: Minimal Assist;to Left;HOB raised (for LLE)  Scooting to HOB: Maximal Assist  Scooting to EOB: Contact Guard Assist  Sit to Supine: Moderate Assist (for LEs)  Sit to Stand: Maximal Assist;Moderate Assist;bed elevated;Increased Effort  Stand to Sit: Moderate Assist    Ambulation:  PMP - Progressive Mobility Protocol   PMP Activity: Step 6 - Walks in Room (side steps along EOB)  Distance Walked (ft) (Step 6,7): 2 Feet     Ambulation: Minimal Assist;with front-wheeled walker  Pattern:  (side steps along EOB. Good weight shifting to L and R, L knee slightly flexed throughout but no buckling noted, decreased clearance of RLE from floor)     Therapeutic Exercise  -LLE heel slides w AAROM and quad sets  -lateral scooting along EOB w emphasis on use of LEs, momentum, and head hips relationship  -seated forward weight shifts w FWW  -sit<>stand x 3 progressing from max a to mod A    Participation and Activity Tolerance:  Patient Participation:  good  Patient Endurance: good    Patient left with call bell within reach, all needs met,   SCD's off (as found)  Floor mat in place  Bed alarm on  and all questions answered. RN notified of session outcome and patient response.     Goals:  Goals  Goal Formulation: With patient, With patient/family  Time for Goal Acheivement: 7 visits  Goals: Select goal  Pt Will Go Supine To Sit: with supervision, to maximize functional mobility and independence, Goal met  Pt Will Perform Sit To Supine: with supervision, to maximize functional mobility and independence  Pt Will Perform Sit to Stand: with contact guard assist, to maximize functional mobility and independence  Pt Will Transfer Bed/Chair: with supervision, to maximize functional mobility and independence, Goal met (to WC)  Pt Will Ambulate: 51-100 feet, with rolling walker, with contact guard assist, to maximize functional mobility and independence  Pt Will Propel Wheelchair: > 150 feet, to maximize functional mobility and independence    PPE worn during session: gloves, procedural mask    Tech present: no  PPE worn by tech: N/A  Time of Treatment  PT Received On: 09/07/22  Start Time: 4010  Stop Time: 1011  Time Calculation (min): 46 min  Treatment # 2 out of 7 visits    Rudi Heap, PT, DPT  Available via [employer]  Pager [phone]

## 2022-09-07 NOTE — Final Progress Note (DC Note for stay less than 48 (Signed)
Orthopedic Trauma Daily Progress Note    09/07/2022 6:19 AM    Laura Mclaughlin is a 61 y.o. 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     Subjective: No acute events overnight. Pain has been well controlled. Patient Denies nausea, vomiting, or fevers.     Physical Exam:  Vitals:    09/07/22 0310   BP: 129/82   Pulse: 96   Resp: 18   Temp: 98.5 F (36.9 C)   SpO2: 94%        Intake/Output Summary (Last 24 hours) at 09/07/2022 3557  Last data filed at 09/07/2022 3220  Gross per 24 hour   Intake --   Output 2300 ml   Net -2300 ml     Lab Results   Component Value Date    INR 1.2 (H) 09/05/2022       left lower extremity:  Incisions about left thigh and pre patellar region well coapted without drainage, overt signs of clinical infection.  +TA,GS  +EHL,FHL  Sensation intact light touch dorsal/plantar foot  Cap refill < 2 seconds  Pedal pulses palpable  No pain with passive ROM digits    Lab Results   Component Value Date    HGB 7.7 (L) 09/07/2022    HGB 7.2 (L) 09/06/2022    HGB 8.4 (L) 09/05/2022      Lab Results   Component Value Date    HCT 24.2 (L) 09/07/2022    HCT 22.6 (L) 09/06/2022    HCT 26.9 (L) 09/05/2022    Review of pertinent labs demonstrate:   - Hemodynamic stability with no significant changes.         Assessment: 61 y.o. 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   - Disposition: Plan for Acute Rehab Facility    Nathanial Millman, DPM     Attending Addendum/Attestation:     I have personally seen and examined this patient and have participated in their care. I agree with the clinical information, including the physical exam and patient history as documented by the Resident/Advanced Practice Provider.  The above note has been edited to reflect my findings as well as incorporate supplementary history and exam that I have obtained.  Using all of the relevant patient information, I have performed and documented the Medical Decision Making in its entirety.    Chevis Pretty, MD  Orthopaedic Trauma  Pager [phone]  Office [phone]

## 2022-09-07 NOTE — Nursing Progress Note (Signed)
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 VSS.  Tolerated whole pills with thin liquids.  Pt able to ambulate to assist with repositioning in bed   Encouraged IS use.   Continent of bowel and bladder. LBM 09/05/22 Pt voiding via ex cath  Pain Controlled with Oxycodone  Good pedal pulses bilaterally  Dressing clean, dry, and intact  Call bell and bedside table within reach at all times.   Notable Shift Events  [place] orders entered this am and dressing changed by resident.     Vladimir Creeks, RN  09/07/22  5:39 AM

## 2022-09-08 LAB — CBC AND DIFFERENTIAL
Absolute NRBC: 0 10*3/uL (ref 0.00–0.00)
Basophils Absolute Automated: 0.03 10*3/uL (ref 0.00–0.08)
Basophils Automated: 0.4 %
Eosinophils Absolute Automated: 0.36 10*3/uL (ref 0.00–0.44)
Eosinophils Automated: 5 %
Hematocrit: 21.9 % — ABNORMAL LOW (ref 34.7–43.7)
Hgb: 6.9 g/dL — ABNORMAL LOW (ref 11.4–14.8)
Immature Granulocytes Absolute: 0.03 10*3/uL (ref 0.00–0.07)
Immature Granulocytes: 0.4 %
Instrument Absolute Neutrophil Count: 4.46 10*3/uL (ref 1.10–6.33)
Lymphocytes Absolute Automated: 1.87 10*3/uL (ref 0.42–3.22)
Lymphocytes Automated: 25.9 %
MCH: 29.9 pg (ref 25.1–33.5)
MCHC: 31.5 g/dL (ref 31.5–35.8)
MCV: 94.8 fL (ref 78.0–96.0)
MPV: 10.5 fL (ref 8.9–12.5)
Monocytes Absolute Automated: 0.47 10*3/uL (ref 0.21–0.85)
Monocytes: 6.5 %
Neutrophils Absolute: 4.46 10*3/uL (ref 1.10–6.33)
Neutrophils: 61.8 %
Nucleated RBC: 0 /100 WBC (ref 0.0–0.0)
Platelets: 243 10*3/uL (ref 142–346)
RBC: 2.31 10*6/uL — ABNORMAL LOW (ref 3.90–5.10)
RDW: 14 % (ref 11–15)
WBC: 7.22 10*3/uL (ref 3.10–9.50)

## 2022-09-08 LAB — PREPARE RBC
Expiration Date: 202311122359
Expiration Date: 202311132359
ISBT CODE: 600
ISBT CODE: 600
UTYPE: A NEG
UTYPE: A NEG

## 2022-09-08 MED ORDER — SODIUM CHLORIDE 0.9 % IV SOLN
INTRAVENOUS | Status: DC | PRN
Start: 2022-09-08 — End: 2022-09-08

## 2022-09-08 NOTE — Plan of Care (Signed)
Summary of Care:  Patient was alert today; pain moderate but controlled with PRN oxycodone. Numbness from knee down on LLE; patient able to plantar/dorsiflex. 1 unit of blood given; vitals remain stable. Plan of care discussed with patient and daughter at the bedside.     1100- Patient has a bed available at [facility]. Report given to acute rehab RN Debarah Crape). Plan is for transport to come to pick up patient at [facility]. Safety maintained.     Plan of Care:  Problem: Moderate/High Fall Risk Score >5  Goal: Patient will remain free of falls  Outcome: Progressing     Problem: Hip Surgery  Goal: Free from Infection  Outcome: Progressing  Goal: Nutritional Intake is Adequate  Outcome: Progressing  Goal: Neurovascular Status is Stable  Outcome: Progressing  Goal: Hemodynamic Stability  Outcome: Progressing  Goal: Mobility/activity is maintained at optimum level for patient  Outcome: Progressing  Goal: Pain at adequate level as identified by patient  Outcome: Progressing  Goal: Address patient self-management plan  Outcome: Progressing  Goal: Patient/Patient Care Companion demonstrates understanding of disease process, treatment plan, medications, and discharge plan  Outcome: Progressing     Problem: Compromised Tissue integrity  Goal: Damaged tissue is healing and protected  Outcome: Progressing  Goal: Nutritional status is improving  Outcome: Progressing

## 2022-09-08 NOTE — Progress Notes (Signed)
09/08/22 1056   CMA Tasks   CMA tasks Transport arranged     Patient stretcher transport scheduled today available time @ 3pm via H&M [phone]

## 2022-09-08 NOTE — Plan of Care (Addendum)
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 Calm and cooperative.VSS.Tolerated whole pills with thin liquids.Continent of bowel and bladder. LBM 09/05/22. Pt voiding via external cath.Pain Controlled with 10 mg oxycodone.Good pedal pulses bilaterally,cap refill <3 sec in all 4 extremities. No nausea or vomiting.call bell and bedside table within reach at all times. Will continue to monitor patient.      Most Recent Set of Vitals   Visit Vitals  BP 120/74   Pulse 98   Temp 99.1 F (37.3 C) (Oral)   Resp 17   Ht 1.753 m (5\' 9" )   Wt 113.4 kg (250 lb)   SpO2 95%   BMI 36.92 kg/m           Burnis Medin, RN  09/08/22  4:35 AM    Problem: Moderate/High Fall Risk Score >5  Goal: Patient will remain free of falls  Flowsheets (Taken 09/07/2022 0807 by Leotis Shames, RN)  Moderate Risk (6-13):   LOW-Fall Interventions Appropriate for Low Fall Risk   LOW-Anticoagulation education for injury risk   MOD-Consider activation of bed alarm if appropriate   MOD-Apply bed exit alarm if patient is confused   MOD-Floor mat at bedside (where available) if appropriate   MOD-Consider a move closer to Nurses Station   MOD-Remain with patient during toileting   MOD-Place bedside commode and assistive devices out of sight when not in use   MOD-Re-orient confused patients   MOD-Utilize diversion activities   MOD-Perform dangle, stand, walk (DSW) prior to mobilization   MOD-Request PT/OT consult order for patients with gait/mobility impairment   MOD-Use gait belt when appropriate   MOD-include family in multidisciplinary POC discussions    MOD- Consider video monitoring     Problem: Hip Surgery  Goal: Free from Infection  Flowsheets (Taken 09/06/2022 0148 by Berlin Hun, RN)  Free from infection:   Monitor/assess vital signs   Maintain temperature within desired parameters   Assess for signs and symptoms of infection   Assess surgical dressing, reinforce or change as needed per order   Teach/reinforce use of incentive spirometer 10 times per hour while awake, cough and deep breath as needed   Foley maintenance care if Foley in place. Utilize indwelling urinary catheter policy/protocol ([place]).   Administer and discontinue antibiotics per SCIP measures  Goal: Mobility/activity is maintained at optimum level for patient  Flowsheets (Taken 09/06/2022 0148 by Berlin Hun, RN)  Mobility/activity is maintained at optimal level for patient:   Evaluate if patient comfort function goal is met   Utilize special equipment (trapeze, abduction pillow, regular pillow, walker) as needed and as ordered   Dangle with assistance if indicated   Out of bed to chair with assistance   Ambulate greater than or equal to 80 feet with assistance   PT and/or OT evaluation and treatment if ordered   Teach/review/reinforce weight bearing status with patient/patient care companion   Teach/review/reinforce hip precautions with  patient/patient care companion   Teach/review/reinforce exercises (ankle pumps, quad sets, gluteal sets)   Participate in PT and/or OT plan of care (bed mobility, transfer/gait training, ADL, curb stair training)  Goal: Patient/Patient Care Companion demonstrates understanding of disease process, treatment plan, medications, and discharge plan  Flowsheets (Taken 09/06/2022 0148 by Berlin Hun, RN)  Patient/patient care companion demonstrates understanding of disease process, treatment plan, medications, and discharge plan:   Provide patient/patient care companion with Joint packet if indicated   Provide education on patient medications, medication side  effects, dressing changes, activity level, brace instructions and care to patient/patient care companion   Consult/collaborate with Case Management/Social Work   Provide anticoagulation teaching if indicated   Identify special equipment needs for discharge

## 2022-09-08 NOTE — Progress Notes (Addendum)
Room at [facility]: 125     09/08/22 1100   Discharge Disposition   Patient preference/choice provided? Yes   Physical Discharge Disposition Acute Rehab   Receiving facility, unit and room number: [facility] at [facility] at [address] [place]   Nursing report phone number: [phone]   Mode of Transportation Other (comment)  (stretcher transportation via H&M at 3 PM)   Patient/Family/POA notified of transfer plan Yes  (pt's daughter [name] # [phone] made aware via phone.)   Family/POA agreeable to discharge plan/expected d/c date? Yes   Bedside nurse notified of transport plan? Yes   Hard copy of narcotic RX sent with patient? No   Hard copy of DNR/Advance Directive sent with patient? No   CM Interventions   Follow up appointment scheduled?   (see AVS)   Multidisciplinary rounds/family meeting before d/c? Yes   Medicare Checklist   Is this a Medicare patient? Yes     Francee Piccolo  RN Case Manager I

## 2022-09-08 NOTE — Progress Notes (Signed)
Orthopedic Trauma Daily Progress Note    09/08/2022 4:47 AM    Laura Mclaughlin is a 61 y.o. female who is now status post L distal femur nonunion resection with ORIF/IMN (POD #3).    Subjective: Low-grade fever which is since subsided.  Notes that she feels much better this morning and that pain is well controlled.  Nausea has also improved.    Physical Exam:  Vitals:    09/07/22 1956   BP: 120/74   Pulse: 98   Resp: 17   Temp: 99.1 F (37.3 C)   SpO2: 95%        Intake/Output Summary (Last 24 hours) at 09/08/2022 0447  Last data filed at 09/07/2022 1956  Gross per 24 hour   Intake --   Output 1920 ml   Net -1920 ml     Lab Results   Component Value Date    INR 1.2 (H) 09/05/2022       left lower extremity:  Dressings clean, dry, intact  +TA,GS  +EHL,FHL  Sensation intact light touch dorsal/plantar foot  Cap refill < 2 seconds  Pedal pulses palpable  No pain with passive ROM digits    Lab Results   Component Value Date    HGB 7.7 (L) 09/07/2022    HGB 7.2 (L) 09/06/2022    HGB 8.4 (L) 09/05/2022      Lab Results   Component Value Date    HCT 24.2 (L) 09/07/2022    HCT 22.6 (L) 09/06/2022    HCT 26.9 (L) 09/05/2022    Review of pertinent labs demonstrate:   - Acute blood loss anemia (clinically significant drop of >2.0 HGB and/or >6.0 HCT) during the current admission.         Assessment: 61 y.o. female  3 Days Post-Op s/p L distal femur nonunion resection, ORIF with intramedullary retrograde nail.     Plan:   - Mobility: Out of bed as tolerated with PT/OT   - Pain control: Continue to wean/titrate to appropriate oral regimen   - DVT Prophylaxis (per Ortho Trauma service Protocol): Lovenox for 30 days followed by aspirin 81 mg twice daily for 30 days   - Foley catheter status: Does not have Foley   - Further surgical plans: No further Orthopaedic plans    - RUE: WBAT   - LUE:  WBAT   - RLE:  WBAT   - LLE:  WBAT   - No transfusion indicated, but will continue to follow serial labs and titrate IV fluids & volume  expanders as indicated to optimize hemodynamics/volume status.   - Disposition: AR pending placement of bed.  Remains medically discharged.    Nathanial Millman, DPM

## 2022-09-11 ENCOUNTER — Encounter: Payer: Self-pay | Admitting: Family

## 2022-09-12 ENCOUNTER — Telehealth: Payer: Self-pay | Admitting: Neurological Surgery

## 2022-09-12 NOTE — Telephone Encounter (Signed)
Attempted to call patient to schedule follow up with Dr. Tera Helper. No answer, LVMTCB. Patient is to see Dr. Tera Helper on 12/04.

## 2022-09-21 ENCOUNTER — Ambulatory Visit (INDEPENDENT_AMBULATORY_CARE_PROVIDER_SITE_OTHER): Payer: Medicare Other | Admitting: Physician Assistant

## 2022-09-21 ENCOUNTER — Ambulatory Visit (INDEPENDENT_AMBULATORY_CARE_PROVIDER_SITE_OTHER): Payer: Medicare Other

## 2022-09-21 ENCOUNTER — Encounter (INDEPENDENT_AMBULATORY_CARE_PROVIDER_SITE_OTHER): Payer: Self-pay | Admitting: Physician Assistant

## 2022-09-21 VITALS — HR 85 | Resp 18 | Ht 69.0 in

## 2022-09-21 DIAGNOSIS — S72402A Unspecified fracture of lower end of left femur, initial encounter for closed fracture: Secondary | ICD-10-CM

## 2022-09-21 NOTE — Progress Notes (Signed)
Orthpaedic Trauma Follow Up Visit    Chief Complaint   Patient presents with    Post-op     16 day s/p Closed fracture of distal end of left femur DOS: 09/05/22       HPI: Laura Mclaughlin is a 61 year old female who is now 16 days status post left femur nonunion osteotomy and repair with autograft and insertion of retrograde intramedullary nail.  Overall she has been doing well since her surgery.  She is complaining of left hip pain.  She has been weightbearing as tolerated to her left lower extremity and has been trying to walk as often as possible.  Given her multi extremity injuries, she is still slow with movement.  She is complaining of some numbness on the top of her foot.  She denies any fevers.  She denies any drainage or pus noted.    Social History     Tobacco Use    Smoking status: Never    Smokeless tobacco: Never   Vaping Use    Vaping Use: Never used   Substance Use Topics    Alcohol use: Never    Drug use: Never       PE:   Vitals:    09/21/22 1343   Pulse: 85   Resp: 18   SpO2: 98%     Left lower extremity:  Wounds healed. No discharge and no evidence of infection.  +DF/PF foot.  Sensation intact light touch dorsal/plantar foot.  + pedal pulse  No calf pain, negative Homan's.    Radiology: Xrays taken in clinic today: AP and lateral views of the left femur were taken in clinic today to evaluate fracture healing.  Hardware stable and well aligned.  Minimal callus formation is noted.  No other abnormalities.    Impression: 61 year old female 2 weeks status post left femur nonunion repair with intramedullary nail, healing well    Plan: All questions were answered.  She should continue to be weightbearing as tolerated to her left lower extremity.  A prescription for physical therapy was given.  I have encouraged her and her husband to start doing outpatient physical therapy as that she is wanting to regain as normal of the life as she possibly can given her all of her injuries.  Staples were removed without  complication.  Steri-Strips were placed.  She should follow-up in 4 weeks for new x-ray and exam.  At that time we will evaluate all injuries.    Xrays @ next clinic visit: Left femur, right wrist, right knee    This note was generated within the EPIC EMR using Dragon medical speech recognition software and may contain inherent errors or omissions not intended by the user. Grammatical and punctuation errors, random word insertions, deletions, pronoun errors and incomplete sentences are occasional consequences of this technology due to software limitations. Not all errors are caught or corrected.  Although every attempt is made to root out erroneus and incomplete transcription, the note may still not fully represent the intent or opinion of the author. If there are questions or concerns about the content of this note or information contained within the body of this dictation they should be addressed directly with the author for clarification
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
## 2022-10-04 ENCOUNTER — Ambulatory Visit (INDEPENDENT_AMBULATORY_CARE_PROVIDER_SITE_OTHER): Payer: BLUE CROSS/BLUE SHIELD | Admitting: Student in an Organized Health Care Education/Training Program

## 2022-10-07 ENCOUNTER — Ambulatory Visit
Admission: RE | Admit: 2022-10-07 | Discharge: 2022-10-07 | Disposition: A | Discharge status: Home / Self Care | Payer: Self-pay | Source: Ambulatory Visit

## 2022-10-08 ENCOUNTER — Other Ambulatory Visit: Payer: Self-pay

## 2022-10-08 ENCOUNTER — Ambulatory Visit: Payer: Medicare Other | Attending: Neurological Surgery | Admitting: Neurological Surgery

## 2022-10-08 ENCOUNTER — Encounter: Payer: Self-pay | Admitting: Neurological Surgery

## 2022-10-08 VITALS — BP 121/85 | HR 77 | Temp 98.2°F | Resp 15

## 2022-10-08 DIAGNOSIS — S12190A Other displaced fracture of second cervical vertebra, initial encounter for closed fracture: Secondary | ICD-10-CM | POA: Insufficient documentation

## 2022-10-08 DIAGNOSIS — S12190S Other displaced fracture of second cervical vertebra, sequela: Secondary | ICD-10-CM

## 2022-10-16 ENCOUNTER — Ambulatory Visit (INDEPENDENT_AMBULATORY_CARE_PROVIDER_SITE_OTHER): Payer: Medicare Other | Admitting: Orthopaedic Trauma

## 2022-10-18 ENCOUNTER — Ambulatory Visit (INDEPENDENT_AMBULATORY_CARE_PROVIDER_SITE_OTHER): Payer: BLUE CROSS/BLUE SHIELD | Admitting: Orthopaedic Trauma

## 2022-10-25 ENCOUNTER — Ambulatory Visit (INDEPENDENT_AMBULATORY_CARE_PROVIDER_SITE_OTHER): Payer: Medicare Other | Admitting: Student in an Organized Health Care Education/Training Program

## 2022-10-25 ENCOUNTER — Ambulatory Visit (INDEPENDENT_AMBULATORY_CARE_PROVIDER_SITE_OTHER): Payer: Medicare Other

## 2022-10-25 VITALS — BP 122/84 | HR 74

## 2022-10-25 DIAGNOSIS — S72432K Displaced fracture of medial condyle of left femur, subsequent encounter for closed fracture with nonunion: Secondary | ICD-10-CM

## 2022-10-25 DIAGNOSIS — S72422K Displaced fracture of lateral condyle of left femur, subsequent encounter for closed fracture with nonunion: Secondary | ICD-10-CM

## 2022-10-25 DIAGNOSIS — M25531 Pain in right wrist: Secondary | ICD-10-CM

## 2022-10-25 DIAGNOSIS — S52571A Other intraarticular fracture of lower end of right radius, initial encounter for closed fracture: Secondary | ICD-10-CM

## 2022-10-25 DIAGNOSIS — M25562 Pain in left knee: Secondary | ICD-10-CM
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
## 2022-11-09 ENCOUNTER — Ambulatory Visit (HOSPITAL_COMMUNITY): Payer: Self-pay

## 2022-11-20 ENCOUNTER — Encounter (INDEPENDENT_AMBULATORY_CARE_PROVIDER_SITE_OTHER): Payer: Self-pay | Admitting: Student in an Organized Health Care Education/Training Program

## 2022-11-21 ENCOUNTER — Encounter (INDEPENDENT_AMBULATORY_CARE_PROVIDER_SITE_OTHER): Payer: Self-pay

## 2022-11-26 ENCOUNTER — Ambulatory Visit (INDEPENDENT_AMBULATORY_CARE_PROVIDER_SITE_OTHER): Payer: Medicare (Managed Care) | Admitting: Internal Medicine

## 2022-11-27 ENCOUNTER — Ambulatory Visit (INDEPENDENT_AMBULATORY_CARE_PROVIDER_SITE_OTHER): Payer: BLUE CROSS/BLUE SHIELD

## 2022-11-27 ENCOUNTER — Ambulatory Visit (INDEPENDENT_AMBULATORY_CARE_PROVIDER_SITE_OTHER): Payer: Medicare (Managed Care) | Admitting: Orthopaedic Surgery

## 2022-11-27 ENCOUNTER — Encounter (INDEPENDENT_AMBULATORY_CARE_PROVIDER_SITE_OTHER): Payer: Self-pay | Admitting: Orthopaedic Surgery

## 2022-11-27 VITALS — BP 149/82 | HR 69

## 2022-11-27 DIAGNOSIS — M25562 Pain in left knee: Secondary | ICD-10-CM

## 2022-11-27 NOTE — Progress Notes (Signed)
Orthpaedic Trauma Follow Up Visit    Chief Complaint   Patient presents with    Post-op     s/p Closed fracture of distal end of left femur DOS: 09/05/22,       HPI: Patient previously underwent operative repair of the right tibial plateau fracture by my partner and then underwent left femur nonunion repair by Dr. Scot Dock.  Overall she is doing fairly well.  She had some increased pain a week ago which prompted an earlier visit.  She is here with husband today.    Social History     Tobacco Use    Smoking status: Never    Smokeless tobacco: Never   Vaping Use    Vaping Use: Never used   Substance Use Topics    Alcohol use: Never    Drug use: Never       PE:   Vitals:    11/27/22 1122   BP: 149/82   Pulse: 69     Right lower extremity:  Wounds healed. No discharge and no evidence of infection.  +DF/PF foot.  Sensation intact light touch dorsal/plantar foot.  + pedal pulse  No calf pain, negative Homan's.  Excellent knee motion    Left lower extremity:  Wounds healed. No discharge and no evidence of infection.  +DF/PF foot.  Sensation intact light touch dorsal/plantar foot.  + pedal pulse  No calf pain, negative Homan's.  Good knee motion 0 to 100 degrees.  There is mild crepitus laterally over the iliotibial band.      Radiology: Xrays taken in clinic today: AP lateral left femur demonstrate a well and healing femur fracture nonunion.  There is increased callus formation compared to radiographs a month ago.    Impression: Healing left femur nonunion.  Healed right tibial plateau fracture.    Plan: I went over the radiographs, diagnosis, and treatment plan with the patient/family.  Continue physical therapy and weight-bear as tolerated bilateral lower extremities.  We provided reassurance.  One of the distal interlocking screws has backed out several millimeters and likely rubbing against her iliotibial band but that is not painful.  I recommend conservative management.  If it worsens or becomes symptomatic we can take  that out in the future.  There is progressive healing both clinically and radiographically.  Follow-up with her previously scheduled visit with Dr. Scot Dock in March.    Xrays next visit: 2 views left femur and 2 views right knee    This note was generated within the EPIC EMR using Dragon medical speech recognition software and may contain inherent errors or omissions not intended by the user. Grammatical and punctuation errors, random word insertions, deletions, pronoun errors and incomplete sentences are occasional consequences of this technology due to software limitations. Not all errors are caught or corrected.  Although every attempt is made to root out erroneus and incomplete transcription, the note may still not fully represent the intent or opinion of the author. If there are questions or concerns about the content of this note or information contained within the body of this dictation they should be addressed directly with the author for clarification.

## 2023-01-08 ENCOUNTER — Ambulatory Visit (INDEPENDENT_AMBULATORY_CARE_PROVIDER_SITE_OTHER): Payer: Medicare (Managed Care)

## 2023-01-08 ENCOUNTER — Encounter (INDEPENDENT_AMBULATORY_CARE_PROVIDER_SITE_OTHER): Payer: Self-pay | Admitting: Student in an Organized Health Care Education/Training Program

## 2023-01-08 ENCOUNTER — Ambulatory Visit (INDEPENDENT_AMBULATORY_CARE_PROVIDER_SITE_OTHER): Payer: Medicare (Managed Care) | Admitting: Student in an Organized Health Care Education/Training Program

## 2023-01-08 VITALS — BP 129/74 | HR 83

## 2023-01-08 DIAGNOSIS — Z09 Encounter for follow-up examination after completed treatment for conditions other than malignant neoplasm: Secondary | ICD-10-CM

## 2023-01-08 DIAGNOSIS — T8484XA Pain due to internal orthopedic prosthetic devices, implants and grafts, initial encounter: Secondary | ICD-10-CM

## 2023-01-08 NOTE — Progress Notes (Signed)
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 Patient is doing physical twice per week.      HPI: Patient previously underwent operative repair of the right tibial plateau fracture by my partner and then underwent left femur nonunion repair.  Overall she is doing well but has some discomfort during range of motion at one spot about distal knee. She has regained most of her motion and able to now walk with arm crutches and considers this to be a big improvement. She is here with husband today.     Social History     Tobacco Use    Smoking status: Never    Smokeless tobacco: Never   Vaping Use    Vaping Use: Never used   Substance Use Topics    Alcohol use: Never    Drug use: Never       PE:   Vitals:    01/08/23 1350   BP: 129/74   Pulse: 83     Right lower extremity:  Wounds healed. No discharge and no evidence of infection.  +DF/PF foot.  Sensation intact light touch dorsal/plantar foot.  + pedal pulse  No calf pain, negative Homan's.  Excellent knee motion     Left lower extremity:  Wounds healed. No discharge and no evidence of infection.  +DF/PF foot.  Sensation intact light touch dorsal/plantar foot.  + pedal pulse  No calf pain, negative Homan's.  Good knee motion 0 to 100 degrees.  There is mild crepitus laterally over the iliotibial band with palpable screw lateral distal femur which is somewhat tender to palpation and painful during arc of knee motion       Radiology: Xrays taken in clinic today: AP lateral left femur demonstrate a well healing femur fracture nonunion.  There is increased callus formation compared to prior xrays. There is a loose distal screw but no other apparent progression of hardware loosening     Impression: Healing left femur nonunion.  Healed right tibial plateau fracture.      Plan: I went over the radiographs, diagnosis, and treatment plan with the patient/family.  Continue physical therapy and weight-bear as tolerated bilateral lower extremities. One of the distal interlocking screws has backed out and likely rubbing against her iliotibial band which is somewhat painful and limiting for her. She would like it removed and I feel that is reasonable and may help alleviate her symptoms and allow for continued progress.  We will plan to schedule this as an outpatient. Risks, benefits and alternatives were discussed. They are understanding and in agreement with the plan as discussed. All questions answered.     Xrays next visit: 2 views left femur and 2 views right knee     This note was generated within the [employer] EMR using Dragon medical speech recognition software and may contain inherent errors or omissions not intended by the user. Grammatical and punctuation errors, random word insertions, deletions, pronoun errors and incomplete sentences are occasional consequences of this technology due to software limitations. Not all errors are caught or corrected.  Although every attempt is made to root out erroneus and incomplete transcription, the note may still not fully represent the intent or opinion of the author. If there are questions or concerns about the content  of this note or information contained within the body of this dictation they should be addressed directly with the author for clarification.

## 2023-01-09 ENCOUNTER — Other Ambulatory Visit (INDEPENDENT_AMBULATORY_CARE_PROVIDER_SITE_OTHER): Payer: Self-pay | Admitting: Student in an Organized Health Care Education/Training Program

## 2023-01-09 ENCOUNTER — Encounter (INDEPENDENT_AMBULATORY_CARE_PROVIDER_SITE_OTHER): Payer: Self-pay

## 2023-01-09 DIAGNOSIS — S72402A Unspecified fracture of lower end of left femur, initial encounter for closed fracture: Secondary | ICD-10-CM

## 2023-01-21 ENCOUNTER — Other Ambulatory Visit: Payer: Self-pay

## 2023-01-22 ENCOUNTER — Ambulatory Visit: Payer: Medicare (Managed Care)

## 2023-01-22 NOTE — PSS Phone Screening (Signed)
Pre-Anesthesia Evaluation    Pre-op phone visit requested by:   Reason for pre-op phone visit: Patient anticipating REMOVAL, HARDWARE, COMPLEX, LEFT FEMUR procedure.         No orders of the defined types were placed in this encounter.      History of Present Illness/Summary:        Problem List:  Medical Problems       Hospital Problem List  Date Reviewed: 01/08/2023   None        Non-Hospital Problem List  Date Reviewed: 01/08/2023            ICD-10-CM Priority Class Noted    Seizure R56.9   05/30/2021    Increasing frequency of seizure activity R56.9   06/01/2021    Liver laceration, major, initial encounter S36.116A   06/09/2022    Closed fracture of left distal femur S72.402A   06/09/2022    Tibial plateau fracture, right, closed, initial encounter S82.141A   06/11/2022    Traumatic hematoma of abdominal wall, initial encounter S30.1XXA   06/11/2022    Closed displaced fracture of second cervical vertebra S12.100A   06/11/2022    Trauma shock, initial encounter T79.4XXA   06/11/2022    Other intraarticular fracture of lower end of right radius, initial encounter for closed fracture S52.571A   06/11/2022    Closed bicondylar fracture of distal femur, left, with nonunion, subsequent encounter S72.422K, S72.432K   09/05/2022    Painful orthopaedic hardware T84.84XA   01/08/2023        Medical History   Diagnosis Date    Closed fracture of distal end of left femur, unspecified fracture morphology, initial encounter     pre-op dx    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 seizure 11/2021    Depression     Difficulty walking     Encounter for blood transfusion     06/09/2022    Hypothyroidism     Lupus     Neuropathy     Restless leg syndrome      Past Surgical History:   Procedure Laterality Date    APPLICATION, EXTERNAL FIXATOR, UPPER EXTREMITY Right 06/14/2022    Procedure: APPLICATION, EXTERNAL FIXATOR, UPPER EXTREMITY;  Surgeon: Marshia Ly, MD;  Location: [facility];  Service:  Orthopedics;  Laterality: Right;    GASTRIC BYPASS N/A     HERNIA REPAIR      ORIF, FEMUR, DISTAL Left 06/11/2022    Procedure: ORIF, FEMUR, DISTAL;  Surgeon: Solon Palm, MD;  Location: [facility];  Service: Orthopedics;  Laterality: Left;    ORIF, RADIUS, DISTAL (WRIST) Right 06/26/2022    Procedure: ORIF, RIGHT RADIUS, DISTAL (WRIST);  Surgeon: Ginny Forth, MD;  Location: [facility];  Service: Orthopedics;  Laterality: Right;    ORIF, TIBIA PLATEAU Right 06/14/2022    Procedure: ORIF, TIBIA PLATEAU RIGHT;  Surgeon: Marshia Ly, MD;  Location: [facility];  Service: Orthopedics;  Laterality: Right;    REMOVAL, HARDWARE, COMPLEX, NON-SPINE Left 09/05/2022    Procedure: LEFT LOWER EXTREMITY HARDWARE REMOVAL;  Surgeon: Philip Aspen, MD;  Location:  [facility];  Service: Orthopedics;  Laterality: Left;    REMOVE, MODIFY, UPPER EXTREMITY EXTERNAL FIXATOR Right 06/26/2022    Procedure: REMOVAL EXTERNAL FIXATOR;  Surgeon: Ginny Forth, MD;  [facility]: [id number];  Service: Orthopedics;  Laterality: Right;    REPAIR, NONUNION, FEMUR, NO  GRAFT Left 09/05/2022    Procedure: LEFT DISTAL FEMUR NONUNION REPAIR WITH IM NAIL;  Surgeon: Philip Aspen, MD;  Location: [facility];  Service: Orthopedics;  Laterality: Left;        Medication List            Accurate as of January 22, 2023 11:27 AM. Always use your most recent med list.                b complex-vitamin c-folic acid 0.8 MG Tabs  Take 1 tablet by mouth daily  Medication Adjustments for Surgery: Hold day of surgery     calcium citrate-vitamin D 315-6.25 MG-MCG Tabs  Take 1 tablet by mouth 3 (three) times daily  Commonly known as: CITRACAL+D  Medication Adjustments for Surgery: Hold day of surgery     cloBAZam 20 MG Tabs  Take 1 tablet (20 mg) by mouth nightly  Medication Adjustments for Surgery: Take as prescribed     Desvenlafaxine ER 100 MG Tb24  Take 1 tablet (100 mg) by mouth daily  Medication Adjustments for Surgery: Take  as prescribed     gabapentin 300 MG capsule  Take 1 capsule (300 mg) by mouth every 8 (eight) hours  Commonly known as: NEURONTIN  Medication Adjustments for Surgery: Take as prescribed     lamoTRIgine 100 MG tablet  Take 1 tablet (100 mg) by mouth 2 (two) times daily  Commonly known as: LaMICtal  Medication Adjustments for Surgery: Take as prescribed     levothyroxine 75 MCG tablet  Take 1 tablet (75 mcg) by mouth Once a day at 6:00am  Commonly known as: SYNTHROID  Medication Adjustments for Surgery: Take as prescribed     Methocarbamol 1000 MG Tabs  Take 1 tablet (1,000 mg) by mouth 4 (four) times daily  Medication Adjustments for Surgery: Take as prescribed     naloxone 4 MG/0.1ML nasal spray  1 spray intranasally. If pt does not respond or relapses into respiratory depression call 911. Give additional doses every 2-3 min.  Commonly known as: NARCAN     ondansetron 4 MG disintegrating tablet  Take 1 tablet (4 mg) by mouth every 8 (eight) hours as needed for Nausea  Commonly known as: ZOFRAN-ODT  Medication Adjustments for Surgery: Take as needed     pantoprazole 40 MG tablet  Take 1 tablet (40 mg) by mouth as needed  Commonly known as: PROTONIX  Medication Adjustments for Surgery: Take as needed     Rotigotine 3 MG/24HR Pt24  Place 1 patch (3 mg) onto the skin every 24 hours  Medication Adjustments for Surgery: Take as prescribed     topiramate 50 MG tablet  Take 1 tablet (50 mg) by mouth 2 (two) times daily Takes 150 mgs twice a day  Commonly known as: TOPAMAX  Medication Adjustments for Surgery: Take as prescribed     traMADol 100 MG 24 hr tablet  Take 1 tablet (100 mg) by mouth daily as needed for Pain  Commonly known as: ULTRAM-ER  Medication Adjustments for Surgery: Take as needed     vitamin B-12 1000 MCG tablet  Take 1 tablet (1,000 mcg) by mouth every 14 (fourteen) days  Commonly known as: CYANOCOBALAMIN  Medication Adjustments for Surgery: Hold day of surgery     vitamin D (ergocalciferol) 50000 UNIT  Caps  Take 1 capsule (50,000 Units) by mouth once a week  Commonly known as: DRISDOL  Medication Adjustments for Surgery: Hold day of surgery  vitamins/minerals Tabs  Take 1 tablet by mouth 2 (two) times daily  Medication Adjustments for Surgery: Hold day of surgery            Allergies   Allergen Reactions    Reglan [Metoclopramide] Other (See Comments)     Tardive dyskinesia    Reglan [Metoclopramide] Other (See Comments)     Tardive dyskinesia     Blood-Group Specific Substance      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     History reviewed. No pertinent family history.  Social History     Occupational History    Not on file   Tobacco Use    Smoking status: Never    Smokeless tobacco: Never   Vaping Use    Vaping Use: Never used   Substance and Sexual Activity    Alcohol use: Never    Drug use: Never    Sexual activity: Not on file       Menstrual History:   LMP / Status  Postmenopausal     No LMP recorded. Patient is postmenopausal.    Tubal Ligation?  No valid surgical or medical questions entered.           Exam Scores:   SDB score  OSA Risk Category: No Risk        STBUR score       PONV score  Nausea Risk: VERY SEVERE RISK    MST score  MST Score: 0    PEN-FAST score       Frailty score       CHADsVasc            Visit Vitals  Ht 1.753 m (5\' 9" )   Wt 104.3 kg (230 lb)   BMI 33.97 kg/m       Recent Labs   CBC (last 180 days) 09/07/22  0333 09/08/22  0559   WBC 7.74 7.22   RBC 2.56* 2.31*   Hgb 7.7* 6.9*   Hematocrit 24.2* 21.9*   MCV 94.5 94.8   MCH 30.1 29.9   MCHC 31.8 31.5   RDW 14 14   Platelets 242 243   MPV 11.0 10.5   Nucleated RBC 0.0 0.0   Absolute NRBC 0.00 0.00     Recent Labs   BMP (last 180 days) 08/07/22  0636 09/06/22  0343   Glucose 84 146*   BUN 16 9.0   Creatinine 0.78 0.7   Sodium 144 140   Potassium 3.4* 3.9   Chloride 110 112*   CO2 23 20   Calcium 9.3 8.2*   Anion Gap 14.4 8.0   EGFR 86  --    eGFR  --  >60.0     Recent Labs   Coag Panel (last 180 days) 09/05/22  1734    PT 13.8*   PT INR 1.2*

## 2023-01-24 ENCOUNTER — Telehealth (INDEPENDENT_AMBULATORY_CARE_PROVIDER_SITE_OTHER): Payer: Self-pay

## 2023-01-24 NOTE — Telephone Encounter (Signed)
LVM - confirming surgery time and location for tomorrow

## 2023-01-25 ENCOUNTER — Ambulatory Visit: Payer: Medicare (Managed Care)

## 2023-01-25 ENCOUNTER — Encounter
Admission: RE | Disposition: A | Discharge status: Home / Self Care | Payer: Self-pay | Source: Ambulatory Visit | Attending: Student in an Organized Health Care Education/Training Program

## 2023-01-25 ENCOUNTER — Encounter: Payer: Self-pay | Admitting: Student in an Organized Health Care Education/Training Program

## 2023-01-25 ENCOUNTER — Ambulatory Visit: Payer: Medicare (Managed Care) | Admitting: Certified Registered Nurse Anesthetist

## 2023-01-25 ENCOUNTER — Ambulatory Visit
Admission: RE | Admit: 2023-01-25 | Discharge: 2023-01-25 | Disposition: A | Discharge status: Home / Self Care | Payer: Medicare (Managed Care) | Source: Ambulatory Visit | Attending: Student in an Organized Health Care Education/Training Program | Admitting: Student in an Organized Health Care Education/Training Program

## 2023-01-25 DIAGNOSIS — T8484XA Pain due to internal orthopedic prosthetic devices, implants and grafts, initial encounter: Secondary | ICD-10-CM | POA: Insufficient documentation

## 2023-01-25 DIAGNOSIS — Y838 Other surgical procedures as the cause of abnormal reaction of the patient, or of later complication, without mention of misadventure at the time of the procedure: Secondary | ICD-10-CM | POA: Insufficient documentation

## 2023-01-25 DIAGNOSIS — R569 Unspecified convulsions: Secondary | ICD-10-CM | POA: Insufficient documentation

## 2023-01-25 DIAGNOSIS — Z87828 Personal history of other (healed) physical injury and trauma: Secondary | ICD-10-CM | POA: Insufficient documentation

## 2023-01-25 DIAGNOSIS — S72402A Unspecified fracture of lower end of left femur, initial encounter for closed fracture: Secondary | ICD-10-CM | POA: Insufficient documentation

## 2023-01-25 DIAGNOSIS — T84125A Displacement of internal fixation device of left femur, initial encounter: Secondary | ICD-10-CM

## 2023-01-25 DIAGNOSIS — Y793 Surgical instruments, materials and orthopedic devices (including sutures) associated with adverse incidents: Secondary | ICD-10-CM | POA: Insufficient documentation

## 2023-01-25 HISTORY — PX: REMOVAL, HARDWARE, COMPLEX, NON-SPINE: SHX5120

## 2023-01-25 SURGERY — REMOVAL, HARDWARE, COMPLEX, NON-SPINE
Anesthesia: Anesthesia General | Site: Leg Upper | Laterality: Left | Wound class: Clean

## 2023-01-25 MED ORDER — FENTANYL CITRATE (PF) 50 MCG/ML IJ SOLN (WRAP)
25.0000 ug | INTRAMUSCULAR | Status: AC | PRN
Start: 2023-01-25 — End: 2023-01-25
  Administered 2023-01-25 (×4): 25 ug via INTRAVENOUS
  Filled 2023-01-25: qty 2

## 2023-01-25 MED ORDER — TOBRAMYCIN SULFATE 1.2 G IJ SOLR
INTRAMUSCULAR | Status: AC
Start: 2023-01-25 — End: ?
  Filled 2023-01-25: qty 1200

## 2023-01-25 MED ORDER — HYDRALAZINE HCL 20 MG/ML IJ SOLN
10.0000 mg | Freq: Once | INTRAMUSCULAR | Status: DC
Start: 2023-01-25 — End: 2023-01-25

## 2023-01-25 MED ORDER — VANCOMYCIN HCL 1 G IV SOLR
INTRAVENOUS | Status: AC
Start: 2023-01-25 — End: ?
  Filled 2023-01-25: qty 1000

## 2023-01-25 MED ORDER — CEFAZOLIN SODIUM 1 G IJ SOLR
INTRAMUSCULAR | Status: DC | PRN
Start: 2023-01-25 — End: 2023-01-25
  Administered 2023-01-25: 2 g via INTRAVENOUS

## 2023-01-25 MED ORDER — LIDOCAINE HCL (PF) 2 % IJ SOLN
INTRAMUSCULAR | Status: DC | PRN
Start: 2023-01-25 — End: 2023-01-25
  Administered 2023-01-25: 50 mg via INTRAVENOUS

## 2023-01-25 MED ORDER — PROPOFOL INFUSION 10 MG/ML
INTRAVENOUS | Status: DC | PRN
Start: 2023-01-25 — End: 2023-01-25
  Administered 2023-01-25: 200 mg via INTRAVENOUS

## 2023-01-25 MED ORDER — HYDROMORPHONE HCL 0.5 MG/0.5 ML IJ SOLN
0.5000 mg | INTRAMUSCULAR | Status: DC | PRN
Start: 2023-01-25 — End: 2023-01-25

## 2023-01-25 MED ORDER — ONDANSETRON HCL 4 MG/2ML IJ SOLN
4.0000 mg | Freq: Once | INTRAMUSCULAR | Status: DC | PRN
Start: 2023-01-25 — End: 2023-01-25

## 2023-01-25 MED ORDER — LACTATED RINGERS IV SOLN
INTRAVENOUS | Status: DC
Start: 2023-01-25 — End: 2023-01-25

## 2023-01-25 MED ORDER — MIDAZOLAM HCL 1 MG/ML IJ SOLN (WRAP)
INTRAMUSCULAR | Status: DC | PRN
Start: 2023-01-25 — End: 2023-01-25
  Administered 2023-01-25: 2 mg via INTRAVENOUS

## 2023-01-25 MED ORDER — DEXMEDETOMIDINE HCL IN NACL 200 MCG/50ML IV SOLN
INTRAVENOUS | Status: AC
Start: 2023-01-25 — End: ?
  Filled 2023-01-25: qty 50

## 2023-01-25 MED ORDER — EPHEDRINE SULFATE 50 MG/ML IJ/IV SOLN (WRAP)
Status: AC
Start: 2023-01-25 — End: ?
  Filled 2023-01-25: qty 1

## 2023-01-25 MED ORDER — SCOPOLAMINE 1 MG/3DAYS TD PT72
1.0000 | MEDICATED_PATCH | Freq: Once | TRANSDERMAL | Status: DC
Start: 2023-01-25 — End: 2023-01-25

## 2023-01-25 MED ORDER — FENTANYL CITRATE (PF) 50 MCG/ML IJ SOLN (WRAP)
INTRAMUSCULAR | Status: DC | PRN
Start: 2023-01-25 — End: 2023-01-25
  Administered 2023-01-25: 25 ug via INTRAVENOUS
  Administered 2023-01-25: 50 ug via INTRAVENOUS
  Administered 2023-01-25: 25 ug via INTRAVENOUS

## 2023-01-25 MED ORDER — ONDANSETRON HCL 4 MG/2ML IJ SOLN
INTRAMUSCULAR | Status: DC | PRN
Start: 2023-01-25 — End: 2023-01-25
  Administered 2023-01-25: 4 mg via INTRAVENOUS

## 2023-01-25 MED ORDER — MIDAZOLAM HCL 1 MG/ML IJ SOLN (WRAP)
INTRAMUSCULAR | Status: AC
Start: 2023-01-25 — End: ?
  Filled 2023-01-25: qty 2

## 2023-01-25 MED ORDER — DEXAMETHASONE SODIUM PHOSPHATE 4 MG/ML IJ SOLN
INTRAMUSCULAR | Status: AC
Start: 2023-01-25 — End: ?
  Filled 2023-01-25: qty 1

## 2023-01-25 MED ORDER — PHENYLEPHRINE 10 MCG/ML IN NACL 0.9% IV SOSY
PREFILLED_SYRINGE | INTRAVENOUS | Status: DC | PRN
Start: 2023-01-25 — End: 2023-01-25
  Administered 2023-01-25: 50 ug via INTRAVENOUS

## 2023-01-25 MED ORDER — OXYCODONE HCL 5 MG PO TABS
5.0000 mg | ORAL_TABLET | Freq: Once | ORAL | Status: DC | PRN
Start: 2023-01-25 — End: 2023-01-25

## 2023-01-25 MED ORDER — DEXAMETHASONE SODIUM PHOSPHATE 4 MG/ML IJ SOLN (WRAP)
INTRAMUSCULAR | Status: DC | PRN
Start: 2023-01-25 — End: 2023-01-25
  Administered 2023-01-25: 4 mg via INTRAVENOUS

## 2023-01-25 MED ORDER — ROCURONIUM BROMIDE 50 MG/5ML IV SOLN
INTRAVENOUS | Status: AC
Start: 2023-01-25 — End: ?
  Filled 2023-01-25: qty 5

## 2023-01-25 MED ORDER — LIDOCAINE HCL (PF) 2 % IJ SOLN
INTRAMUSCULAR | Status: AC
Start: 2023-01-25 — End: ?
  Filled 2023-01-25: qty 5

## 2023-01-25 MED ORDER — PROPOFOL 10 MG/ML IV EMUL (WRAP)
INTRAVENOUS | Status: AC
Start: 2023-01-25 — End: ?
  Filled 2023-01-25: qty 20

## 2023-01-25 MED ORDER — FENTANYL CITRATE (PF) 50 MCG/ML IJ SOLN (WRAP)
INTRAMUSCULAR | Status: AC
Start: 2023-01-25 — End: ?
  Filled 2023-01-25: qty 2

## 2023-01-25 MED ORDER — EPHEDRINE SULFATE 50 MG/ML IJ/IV SOLN (WRAP)
Status: DC | PRN
Start: 2023-01-25 — End: 2023-01-25
  Administered 2023-01-25: 5 mg via INTRAVENOUS
  Administered 2023-01-25: 10 mg via INTRAVENOUS

## 2023-01-25 MED ORDER — LABETALOL HCL 5 MG/ML IV SOLN (WRAP)
20.0000 mg | Freq: Once | INTRAVENOUS | Status: DC
Start: 2023-01-25 — End: 2023-01-25

## 2023-01-25 MED ORDER — ONDANSETRON HCL 4 MG/2ML IJ SOLN
INTRAMUSCULAR | Status: AC
Start: 2023-01-25 — End: ?
  Filled 2023-01-25: qty 2

## 2023-01-25 MED ORDER — CEFAZOLIN SODIUM 1 G IJ SOLR
INTRAMUSCULAR | Status: AC
Start: 2023-01-25 — End: ?
  Filled 2023-01-25: qty 5000

## 2023-01-25 MED ORDER — SODIUM CHLORIDE 0.9% BAG (IRRIGATION USE)
INTRAVENOUS | Status: DC | PRN
Start: 2023-01-25 — End: 2023-01-25
  Administered 2023-01-25: 600 mL

## 2023-01-25 SURGICAL SUPPLY — 49 items
BANDAGE ELASTIC L5 YD X W6 IN W/SELF-CLOSURE HOOK (Bandage) IMPLANT
BANDAGE MEDIUM ELASTIC MATRIX POLYESTER COTTON L5 YD X W6 IN (Bandage) IMPLANT
BANDAGE MEDLINE MEDIUM COMPRESSION L5 YD (Bandage)
COVER EQUIP STERILE 36X28" (Procedure Accessories) ×1 IMPLANT
CUFF TOURNIQUET CYLINDRICAL L30 IN X W4 IN 2 PORT 1 BLADDER QUICK (Procedure Accessories) IMPLANT
CUFF TRNQT CYL CLR CUF 30X4IN LF STRL 2 (Procedure Accessories)
CUFF TRNQT CYL CLR CUF 34X4IN LF STRL 2 (Procedure Accessories) IMPLANT
DRAPE EQP C-ARMOR STRL XPD CLPSBL CARM (Drape) ×1
DRAPE EXPAND COLLAPSIBLE EQUIPMENT C-ARMOR C ARM FLUOROSCOPE STERILE (Drape) ×1 IMPLANT
DRAPE SRGCL FANFOLD ABSRBNT RNFRCMNT 77X53IN PRXM SMS 43528 44X23IN (Drape) ×2 IMPLANT
DRAPE SURGICAL 2 INCISE FILM (Drape) IMPLANT
DRAPE SURGICAL ADHESIVE L51 IN X W47 IN (Drape) ×1
DRAPE SURGICAL ADHESIVE L51 IN X W47 IN STERI-DRAPE CLEAR (Drape) ×1 IMPLANT
DRAPE SURGICAL FANFOLD ABSORBENT (Drape) ×2
DRAPE SURGICAL IMPERVIOUS LARGE FILM L84 (Drape) ×1
DRAPE SURGICAL IMPERVIOUS LARGE FILM L84 IN X W60 IN SMS U (Drape) ×1 IMPLANT
DRESSING PETRO 3% BI 3BRM GZE XR 9X5IN (Dressing)
DRESSING PETROLATUM XEROFORM L8 IN X W1 (Dressing) ×1
DRESSING PETROLATUM XEROFORM L8 IN X W1 IN 3% BISMUTH TRIBROMOPHENATE (Dressing) IMPLANT
DRESSING PETROLATUM XEROFORM L9 IN X W5 IN 3% BISMUTH TRIBROMOPHENATE (Dressing) IMPLANT
DRESSING TRANSPARENT L4 3/4 IN X W4 IN (Dressing) ×4
DRESSING TRANSPARENT L4 3/4 IN X W4 IN POLYURETHANE ADHESIVE (Dressing) IMPLANT
GLOVE SURGICAL 7 1/2 BIOGEL PI (Glove) ×2
GLOVE SURGICAL 7 1/2 BIOGEL PI INDICATOR (Glove) ×2
GLOVE SURGICAL 7 1/2 BIOGEL PI INDICATOR UNDERGLOVE POWDER FREE SMOOTH (Glove) ×2 IMPLANT
GLOVE SURGICAL 7 1/2 BIOGEL PI ULTRATOUCH G POWDER FREE ROUGH BEAD (Glove) ×2 IMPLANT
MANIFOLD SUCTION 2 STANDARD 4 PORT (Filter) ×1
MANIFOLD SUCTION 2 STANDARD 4 PORT NEPTUNE 2 WASTE MANAGEMENT SYSTEM (Filter) ×1 IMPLANT
SOLUTION ANTISEPTIC RUB BOTTLE MEDLINE (Scrub Supplies) ×1 IMPLANT
SOLUTION SURGICAL PREP 26 ML DURAPREP (Prep) ×1
SOLUTION SURGICAL PREP 26 ML DURAPREP 74% ISOPROPYL ALCOHOL 0.7% (Prep) ×1 IMPLANT
SPONGE GAUZE COTTON L4 IN X W4 IN 12 PLY WOVEN FOLD EDGE STERILE LATEX FREE (Dressing) IMPLANT
SPONGE GAUZE L4 IN X W4 IN 12 PLY (Sponge) ×1 IMPLANT
SPONGE GAUZE L4 IN X W4 IN 12 PLY WOVEN (Dressing) ×1
SPONGE GAUZE L4 IN X W4 IN 12 PLY WOVEN FOLD EDGE XRAY DETECT COTTON (Dressing) ×1 IMPLANT
STOCKINETTE L48 IN X W12 IN IMPERVIOUS (Ortho Supply) ×1
STOCKINETTE L48 IN X W12 IN IMPERVIOUS PULL TAB HOLLOW ORTHOPEDIC (Ortho Supply) ×1 IMPLANT
SUTURE COATED VICRYL 0 CT-1 L36 IN BRAID (Suture) ×2
SUTURE COATED VICRYL 0 CT-1 L36 IN BRAID COATED UNDYED ABSORBABLE (Suture) ×2 IMPLANT
SUTURE MONOCRYL 2-0 CT-1 L36 IN (Suture) ×2
SUTURE MONOCRYL 2-0 CT-1 L36 IN MONOFILAMENT UNDYED ABSORBABLE (Suture) ×2 IMPLANT
SUTURE MONOCRYL 3-0 PS-2 L27 IN (Suture) ×2
SUTURE MONOCRYL 3-0 PS-2 L27 IN MONOFILAMENT UNDYED ABSORBABLE (Suture) IMPLANT
TOURNIQUET SRG CLR CUF 44X4IN LF STRL 2 (Procedure Accessories)
TOURNIQUET SURGICAL NAVY BLUE 2 PORT 1 BLADDER TUBE LIMB PROTECTION (Procedure Accessories) IMPLANT
TRAY SRG LF NS LH XTRMT DISP (Pack) ×1
TRAY SURGICAL EXTREMITY ILH (Pack) ×1 IMPLANT
WRAP COMPRESSION L5 YD X W6 IN SELF (Bandage) ×1
WRAP COMPRESSION L5 YD X W6 IN SELF ADHERENT ELASTIC LIGHTWEIGHT HAND (Bandage) ×1 IMPLANT

## 2023-01-25 NOTE — Discharge Instr - AVS First Page (Addendum)
Office contact and follow up information:     Garment/textile technologist and [place] - [phone]    [place] office:     [place], [address]nd floor  [place], [place] office:  [address] [place]  [place], [place] [address]  Phone: [phone]     Fax: [phone]       Please contact the office as soon as possible to schedule a follow-up appointment with:  Philip Aspen, MD;     (Or the following in [place]: Lanelle Bal, MD; Julienne Kass, MD; Christy Gentles, MD)    You will need to be seen approximately 2 weeks from the date of your surgery  Date of Surgery: 01/25/2023, Procedure: Procedure(s) (LRB):  LEFT FEMUR REMOVAL OF HARDWARE (Left)       YOUR STAPLES/SUTURES WILL BE REMOVED AT YOUR FIRST ORTHOPAEDIC FOLLOW UP Laura Mclaughlin UNLESS YOU HAVE [place], OR [place].     Activity Weight Bearing Status    Weight bearing as tolerated left leg.     Medications  In order to minimize narcotic use and establish better pain control, we employ a multimodal pain approach. This protocol combines the use of scheduled Tylenol, Gabapentin (Neurontin), and narcotics.  We will often use anti-inflammatories (NSAID's such as Ibuprofen, Motrin, Advil, Naprosyn, and Alleve) to further assist with pain control thus allowing for a lower dose of narcotic to be used. Dosing of medications will vary from patient to patient based on their needs and past medical history, so please refer to your discharge medication list.      Tylenol (Acetaminophen):  This medication is available over the counter. For adults: Recommend 1000mg  every 8 hours for the first week, then you may cut back to every 8 hours only as needed and begin to wean off completely.  For children: Recommend weight based dosing as directed on the medication packaging.  Prior to  adding Tylenol, make sure your pain medication does not have any Tylenol in it. (Percocet, Vicodin, and Norco all have Tylenol.) DO NOT take more than 3000 mg in a 24 hr period and DO NOT take until discussing with your primary care provider if you have any liver problems.     Gabapentin: This is a prescribed generic medication whose brand name is also known as Neurontin.  It should be taken as instructed which is usually three times per day.  Gabapentin does not work like most pain meds where you only take a dose when needed, rather it needs to be 3 times per day to have maximum effect.  This medication should not be stopped abruptly as it may cause withdrawal. When ready to discontinue Gabapentin please follow the teps listed below:    -First, Stop the mid-day dose for 3 days    -Then stop the Morning dose for 3 days    -Then stop the final dose      - NSAIDs: (Motrin, Advil, Ibuprofen, Naprosyn, Aleve) This medication can be found over the counter.  There are occasional fractures that are notorious for slow healing, in which case you will be advised against using this class of medications. Adults: Recommend Ibuprofen 800mg  every 8 hours for the first week (taken with food) then you may wean as tolerated. Children: Recommend using Motrin dosed per  the child's weight as instructed on the packaging. If you have Kidney problems, DO NOT take this medication until discussing with your primary care provider.  These medicines cannot be taken if you are pregnant.   Stomach Ulcer Prevention: Ongoing use of NSAID medications can be very irritating to your stomach, potentially leading to gastritis or ulcers.  While taking NSAID's, we recommend that you also take Nexxium (Esomeprazole) 20mg  or Prevacid (Lansoprazole) 15 mg daily while on NSAID's, both of which are over the counter medications.    Narcotic: (Oxycodone/Dilaudid/Hydrocodone/Tramadol/etc.) This is a prescribed opioid medication and should only be taken as  instructed.  For mild pain we recommend taking Tylenol/Gabapentin/Ibuprofen. For moderate/severe pain you may combine the prescribed opioid dose along with the Tylenol/Gabapentin/Ibuprofen.  (If prescribed a dose range, please take the lowest dose for moderate pain and the higher dose for severe pain) If you start experiencing shortness of breath, confusion, drowsiness, or become unresponsive you or someone should administer Narcan (Naloxone- Opioid overdose reversal medication) if prescribed and/or call 911 immediately.  [place] narcotic law mandates that we prescribe Narcan if you take above a certain dose of narcotics, have a history of drug abuse/overdose, or are also taking other sedative meds such as benzidiapenes (Valium, Ativan, Xanax, etc.)  Narcotic medications are the first ones to be weaned as your pain starts to subside.      Constipation Prevention: You should be taking a stool softener (ex. Colace) as long as you are on any narcotic pain medicines.  You should be having a bowel movement every 2-3 days.  If not, then proceed with over the counter laxatives (Senokot -S/Miralax), enemas, or suppositories to fix the constipation.  All of these are over the counter and can be discussed in more detail with your pharmacist.    [facility] Patients  If wound develops redness, warmth, or drainage, do not start on antibiotics.  Please contact our office to discuss with our nursing team and this will possibly require an in-office visit for a wound check.     Wound Care  -  If you have a soft dressing only (no splint or cast), you may take a shower 3 days after surgery (no baths). Do not scrub incision. Pat dry with a clean towel.  You may then change your dressings as needed with clean gauze and tape or ACE bandages   -  Do not remove any splint or cast. This will be addressed at your follow-up appointment. If you feel that your cast/splint is too tight or there maybe another issue with it, please  call the office to be seen or come to the emergency room.   -  Keep your splint/cast clean and dry.  -  If you have increased drainage, incision redness, foul smell, or fevers - inform the office.  You may need to be evaluated in the office earlier than your follow-up appointment.    Ex-Fix Care  - May take a shower at home  - Let the warm water run down the extremity  - DO NOT scrub the pin sites or stick anything in the pin sites to clean them    Diet  -  You may resume your normal diet.  -  It is quite common to have a decreased appetite for the first week or two following       your injury/surgery.  This may be due to your pain level and the pain medications you  are taking.  Be sure to drink plenty of fluids during this time to remain hydrated.   -  You should be taking a stool softener (ex. Colace) as long as you are on any narcotic      pain medicines.  You should be having a bowel movement every 2-3 days.  If not, then       proceed with over the counter laxatives, enemas, or suppositories to fix the      constipation     X-Rays (Radiology Studies)  -  In most cases, we will perform post-op x-rays at our office at the time of your follow-up      appointment.  Check with your health insurance carrier if this is permitted.  If not, they      can provide a list of providers which are covered.   -  If your x-rays (or MRI or CT) are to be performed anywhere other than our office, you         must make sure they give you the actual x-rays or a CD-ROM to bring with you to the         office visit.  A radiology report is not adequate; the surgeon must visualize the actual      images to determine your plan of care.       Physical/Occupational Therapy  During the rehabilitation course from your injury, we may prescribe physical or occupational therapy.  In these cases, we will provide you with a list of therapists in the area.  This list in not comprehensive, and you are welcome to go to any therapist that you  like - however, you must see if they accept your insurance, or you will be expected to pay at the time services are rendered.     [facility] are all part of the same healthcare system as [facility] and our office.  These centers accept most health insurance plans, or will participate in the financial arrangement (charity/discounted payment plan) that was put into effect when you presented to [facility].    Blood Clot Prevention    You may be given a prescription for a blood thinner that is meant to minimize your chances for developing blood clots.  These clots are call Deep Venous Thrombosis (DVT) or Pulmonary Embolus (PE).  While preventative measures are not 100% effective, they greatly decrease the chance of developing a DVT or PE.      If you are prescribed lovenox and choose not to have this prescription filled, you are at risk of having a VTE (blood clot in leg, arm, or chest). If you do not get this prescription filled, then we strongly suggest that you take aspirin (81 mg) twice per day.     Special Instructions: Please call as soon as possible to arrange your follow-up appointment.     Please visit: [email or web address] (or scan the QR code below) for more information about our practice.     Please visit: [email or web address] (or scan the QR code below) to sign up for MyChart. MyChart is a patient portal and valuable tool for taking control of your medical records, including the ability to access your medical records, view your test results and medications, schedule appointments, and communicate with your care providers.         We hope that you have experienced excellent care from our team at [facility]. We continuously strive to improve and  offer state of the art and world class care to our patients.  If you would like to express your gratitude or  make a donation to support [facility] efforts, please contact us at: [phone] or [email or web address].

## 2023-01-25 NOTE — Anesthesia Postprocedure Evaluation (Addendum)
Anesthesia Post Evaluation    Patient: Laura Mclaughlin    Procedure(s):  LEFT FEMUR REMOVAL OF HARDWARE    Anesthesia type: No value filed.    Last Vitals:   Vitals Value Taken Time   BP 116/74 01/25/23 0841   Temp 36.3 C (97.3 F) 01/25/23 0841   Pulse 92 01/25/23 0841   Resp 22 01/25/23 0841   SpO2 100 % 01/25/23 0841                 Anesthesia Post Evaluation:     Patient Evaluated: PACU  Patient Participation: waiting for patient participation  Level of Consciousness: sleepy but conscious  Pain Score: 0  Pain Management: adequate  Multimodal analgesia pain management approach  Strategies: gabapentinoids and local anesthesia  Airway Patency: patent  Two or more mitigation strategies used for obstructive sleep apnea.  Strategies: multimodal analgesia and postop administration of CPAP/nasophargyneal airway/oral appliance    Anesthetic complications: No      PONV Status: none    Cardiovascular status: acceptable  Respiratory status: acceptable  Hydration status: acceptable      Signed by: Tomma Rakers, CRNA, 01/25/2023 8:42 AM

## 2023-01-25 NOTE — Transfer of Care (Signed)
Anesthesia Transfer of Care Note    Patient: Laura Mclaughlin    Procedures performed: Procedure(s):  LEFT FEMUR REMOVAL OF HARDWARE    Anesthesia type: General LMA    Patient location:Phase I PACU    Last vitals:   Vitals:    01/25/23 0841   BP: 116/74   Pulse: 92   Resp: 22   Temp: 36.3 C (97.3 F)   SpO2: 100%       Post pain: Patient not complaining of pain, continue current therapy      Mental Status:alert     Respiratory Function: tolerating face mask    Cardiovascular: stable    Nausea/Vomiting: patient not complaining of nausea or vomiting    Hydration Status: adequate    Post assessment: no apparent anesthetic complications    Signed by: Tomma Rakers, CRNA  01/25/23 8:41 AM

## 2023-01-25 NOTE — Brief Op Note (Signed)
BRIEF OP NOTE    Date Time: 01/25/23 8:44 AM    Patient Name:   Laura Mclaughlin, Laura Mclaughlin (MRN: [id number])    Date of Operation:   01/25/2023     Providers Performing:   Surgeons and Role:     Philip Aspen, MD - Primary    Surgical First Assistant(s):   First Assistant: Fara Boros    Operative Procedure:   LEFT FEMUR REMOVAL OF HARDWARE: 36644 (CPT)    Preoperative Diagnosis:   Pre-Op Diagnosis Codes:     * Closed fracture of distal end of left femur, unspecified fracture morphology, initial encounter [S72.402A]    Postoperative Diagnosis:   Post-Op Diagnosis Codes:     * Closed fracture of distal end of left femur, unspecified fracture morphology, initial encounter [S72.402A]    Findings:   Two loose screws distally which have progressing loosening on serial imaging. No change in proximal screws compared to prior imaging.     Anesthesia:   General     Estimated Blood Loss:    5 mL    Implants:     Implant Name Type Inv. Item Serial No. Model No. Manufacturer Lot No. LRB No. Used Action   SCREW BN SS 3.5MM 4.5MM 40MM NS ST LG - [id number] Screw SCREW L40 MM OD4.5 MM 3.5 MM STAINLESS  214.840 [employer]  Left 2 Explanted     Was this an implant failure that may be covered under warranty or implant recall? : No    Drains:   Drains: no    Specimens:     ID Type Source Tests Collected by Time Destination   A :  No Specimen Required No Specimen NO PATHOLOGY Philip Aspen, MD 0000000 [id number]      Complications:   None     Signed by: Philip Aspen, MD                                                                           [facility]
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
## 2023-01-25 NOTE — Op Note (Signed)
FULL OPERATIVE NOTE    Date Time: 01/25/23 8:46 AM  Patient Name: Laura Mclaughlin, Laura Mclaughlin (MRN: [id number])  Attending Physician: Philip Aspen, MD      Date of Operation:   01/25/2023    Providers Performing:   Surgeons and Role:     Philip Aspen, MD - Primary    Surgical First Assistant(s):   First Assistant: Fara Boros    Operative Procedure:   LEFT FEMUR REMOVAL OF HARDWARE: 69629 (CPT)    Preoperative Diagnosis:   Pre-Op Diagnosis Codes:     * Closed fracture of distal end of left femur, unspecified fracture morphology, initial encounter [S72.402A]    Symptomatic hardware left femur   Postoperative Diagnosis:   Post-Op Diagnosis Codes:     * Closed fracture of distal end of left femur, unspecified fracture morphology, initial encounter [S72.402A]    Symptomatic hardware left femur   Anesthesia:   General     Findings:   Two loose screws distally which have progressing loosening on serial imaging. No change in proximal screws compared to prior imaging. No overt evidence of infection.     Estimated Blood Loss:   5 mL    Implants:     Implant Name Type Inv. Item Serial No. Model No. Manufacturer Lot No. LRB No. Used Action   SCREW BN SS 3.5MM 4.5MM 40MM NS ST LG - [id number] Screw SCREW L40 MM OD4.5 MM 3.5 MM STAINLESS  214.840 [employer]UMA  Left 2 Explanted     Was this an implant failure that may be covered under warranty or implant recall? : No     Drains:   Drains: no    Specimens:     ID Type Source Tests Collected by Time Destination   A :  No Specimen Required No Specimen NO PATHOLOGY Philip Aspen, MD 0000000 [id number]      Complications:   None    Operative Notes:   Indications for procedure: Please see my consultation/notes for details. This patient has symptomatic left femur distal screws which have been loosening on serial imaging, clinically bothering her and physical examination demonstrates corresponding findings. I discussed treatment options with the patient/family and they wish to proceed with  recommended operative management for removal of left leg symptomatic hardware. Risks/benefits and alternatives were discussed. Consent was obtained.     Risks of surgery: Pain, infection, nerve and artery injury, bleeding, hardware failure, malunion, nonunion, refracture, dislocation, leg length inequality, malalignment, complex regional pain syndrome, neuropathy, anemia, medical complications including but not limited to cardiovascular, respiratory, neurologic, blood clots, and death.    Description of procedure: The patient was identified in the preoperative holding area, the correct limb was marked with ink, and the consent was verified.  The patient was transferred to the operating room and administered anesthesia.  The limb was prepped and draped in normal sterile fashion.  All bony prominences were well-padded. Prophylactic antibiotics were administered and an SCD was placed on the nonoperative leg.  A final timeout was taken identifying the patient and surgical location.  All parties were in agreement.    I began by imaging the femur with multiplanar fluoroscopy. I noted that there was progression of loosening of two distal interlocking bolts which were both symptomatic to the patient at the time of my last examination and I localized my incisions appropriately over these screws with fluoroscopy. I also noted that the more distal of the proximal interlocking bolts was loose however there was no progression of loosening  of this screw from prior imaging and this was not clinically symptomatic to the patient so I elected to retain this.     I then incised the skin overlying the lateral and medial loose interlocking bolts sharply through skin and subcutaneous tissue. I incised overlying fascia and scar tissue and visualized the lateral and medial screws. I then used the appropriate Stryker screw driver to remove both screws uneventfully. There was no gross evidence of infection. I confirmed screw removal and  fracture stability. I then irrigated both wounds with saline irrigation and hemostasis was achieved and closed deep tissue with 2-0 monocryl, subcutaneous tissue with 3-0 monocryl and skin with 3-0 monocryl.    The knee exam was stable. Sterile dressings were applied.  Leg lengths were equal and rotation was symmetric.  Compartments were soft and pedal pulses were palpable. The patient was extubated and transferred to the PACU in stable condition. All counts were correct.      Postoperative plan:  Weight-bear as tolerated left lower extremity.     F/u in office 2-3 weeks. Wound check and clinical exam at that time with new xrays of the left femur only.     Signed by: Philip Aspen, MD  [facility]

## 2023-01-28 ENCOUNTER — Encounter: Payer: Self-pay | Admitting: Student in an Organized Health Care Education/Training Program

## 2023-02-12 ENCOUNTER — Ambulatory Visit (INDEPENDENT_AMBULATORY_CARE_PROVIDER_SITE_OTHER): Payer: Medicare (Managed Care)

## 2023-02-12 ENCOUNTER — Ambulatory Visit (INDEPENDENT_AMBULATORY_CARE_PROVIDER_SITE_OTHER): Payer: Medicare (Managed Care) | Admitting: Student in an Organized Health Care Education/Training Program

## 2023-02-12 VITALS — BP 143/85 | HR 84

## 2023-02-12 DIAGNOSIS — S83241A Other tear of medial meniscus, current injury, right knee, initial encounter: Secondary | ICD-10-CM

## 2023-02-12 DIAGNOSIS — M25561 Pain in right knee: Secondary | ICD-10-CM

## 2023-02-12 DIAGNOSIS — T8484XA Pain due to internal orthopedic prosthetic devices, implants and grafts, initial encounter: Secondary | ICD-10-CM

## 2023-02-12 DIAGNOSIS — M25562 Pain in left knee: Secondary | ICD-10-CM

## 2023-02-12 NOTE — Progress Notes (Signed)
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  She subsequently had loosening of 2 of the distal screws and a retrograde nail and underwent screw removal on 01/25/2023.  She is now approximately 2 weeks out from that recent surgery.  Since that time she feels much better and her left knee is continued to progress and heal well.  She now has nearly full range of motion and has been walking on the left knee quite well.  Unfortunately she has begun to experience right knee pain on the inside of her knee shooting pains that occur intermittently.  She feels that this is somewhat limiting her now.  She also was recently admitted with pneumonia which she has since recovered from and is back home and doing well.  She is continuing to ambulate with canes.  She has not had any instability events or recent falls.  No numbness or tingling in bilateral lower extremities.    Social History     Tobacco Use    Smoking status: Never    Smokeless tobacco: Never   Vaping Use    Vaping status: Never Used   Substance Use Topics    Alcohol use: Never    Drug use: Never       PE:   Vitals:    02/12/23 1257   BP: 143/85   Pulse: 84     Right lower extremity:  Wounds healed. No discharge and no evidence of infection.  +DF/PF foot.  Sensation intact light touch dorsal/plantar foot.  + pedal pulse  No calf pain, negative Homan's.  There is no apparent instability with varus valgus stress.  Negative Lachman's negative posterior drawer negative posterolateral corner  Mild tenderness to palpation at medial joint line and anteromedial tibial plateau     Left lower extremity:  Incisions are healing without signs of infection, erythema, drainage  +DF/PF foot.  Sensation intact light touch  dorsal/plantar foot.  + pedal pulse  No calf pain, negative Homan's.  Knee range of motion without pain  Walks with slow gait with use of canes     Radiology: Xrays taken in clinic today:   AP lateral left femur and knee demonstrate a well healing femur fracture nonunion.  There is increased callus formation and remodeling compared to prior xrays.   AP and lateral right knee demonstrates well-healed fracture.  Alignment is maintained.     Impression: Healing left femur nonunion.  Healed right tibial plateau fracture.  Acute left knee pain consistent with likely medial meniscal pathology.    Plan: I went over the radiographs, diagnosis, and treatment plan with the patient/family.    As I discussed with Mrs. Aird and her husband her physical exam and symptoms are consistent with medial meniscus pathology.  It is possible that she has an acute versus acute on chronic meniscal tear which may be a result of her various stresses incurred to the right knee as she has been quite limited due to her left leg previously, as well as the severity of her prior injuries.  Her pain is otherwise relatively well-controlled and continues to be short lasting and intermittent and I encouraged anti-inflammatory medications as needed as well as  physical therapy  Continue with range of motion and weightbearing as tolerated on bilateral lower extremities  We discussed the potential for obtaining an MRI of the right knee however given her tibial plateau fracture fixation and will likely be significantly limited due to artifact and potentially not indicated if her pain continues to be self-limiting or improves as his often the case in these situations.  We discussed the potential for mechanical symptoms and should this occur I would likely recommend urgent MRI and/or additional interventions as needed.  She has not had any mechanical symptoms at this time.  Otherwise I will plan to see her back in approximately 2 months to see how she is  doing overall and continue to monitor her healing and progression.  They are understanding and in agreement with the plan as discussed. All questions answered.     Xrays @ next clinic visit: Left femur x-rays    This note was generated within the [employer] EMR using Dragon medical speech recognition software and may contain inherent errors or omissions not intended by the user. Grammatical and punctuation errors, random word insertions, deletions, pronoun errors and incomplete sentences are occasional consequences of this technology due to software limitations. Not all errors are caught or corrected.  Although every attempt is made to root out erroneus and incomplete transcription, the note may still not fully represent the intent or opinion of the author. If there are questions or concerns about the content of this note or information contained within the body of this dictation they should be addressed directly with the author for clarification

## 2023-02-19 ENCOUNTER — Ambulatory Visit: Payer: BLUE CROSS/BLUE SHIELD | Admitting: Family

## 2023-03-06 ENCOUNTER — Encounter: Payer: Self-pay | Admitting: Family

## 2023-03-13 ENCOUNTER — Encounter (INDEPENDENT_AMBULATORY_CARE_PROVIDER_SITE_OTHER): Payer: Self-pay | Admitting: Student in an Organized Health Care Education/Training Program

## 2023-03-14 ENCOUNTER — Ambulatory Visit: Payer: BLUE CROSS/BLUE SHIELD | Admitting: Family

## 2023-03-19 ENCOUNTER — Ambulatory Visit: Payer: BLUE CROSS/BLUE SHIELD | Admitting: Family

## 2023-04-12 ENCOUNTER — Ambulatory Visit: Payer: BLUE CROSS/BLUE SHIELD | Admitting: Family

## 2023-04-24 ENCOUNTER — Other Ambulatory Visit (INDEPENDENT_AMBULATORY_CARE_PROVIDER_SITE_OTHER): Payer: Self-pay

## 2023-04-24 DIAGNOSIS — M898X5 Other specified disorders of bone, thigh: Secondary | ICD-10-CM

## 2023-04-25 ENCOUNTER — Encounter (INDEPENDENT_AMBULATORY_CARE_PROVIDER_SITE_OTHER): Payer: Self-pay | Admitting: Student in an Organized Health Care Education/Training Program

## 2023-04-25 ENCOUNTER — Telehealth (INDEPENDENT_AMBULATORY_CARE_PROVIDER_SITE_OTHER): Payer: Self-pay

## 2023-04-25 NOTE — Telephone Encounter (Signed)
01/25/23 s/p LEFT FEMUR REMOVAL OF HARDWARE has had 1 FU, is in [place], said tylenol is not helping her, her leg hurs a lot and is requesting for tramadol to be send to [facility] at [address], [place], [place] [address] [phone].  I let patient know I will send a message to the provider and will get back to them once it is done, voiced understanding. Message sent to the provider.

## 2023-05-01 ENCOUNTER — Telehealth (INDEPENDENT_AMBULATORY_CARE_PROVIDER_SITE_OTHER): Payer: Self-pay

## 2023-05-01 NOTE — Telephone Encounter (Signed)
I called and let patient know the team advise since she is over 3 months out of surgery, for a patient [place] in another state, it is recommend they f/u with a pain management provider close to them, voiced understanding.

## 2023-05-02 ENCOUNTER — Encounter (INDEPENDENT_AMBULATORY_CARE_PROVIDER_SITE_OTHER): Payer: Self-pay

## 2023-05-02 ENCOUNTER — Ambulatory Visit (INDEPENDENT_AMBULATORY_CARE_PROVIDER_SITE_OTHER): Payer: Medicare PPO

## 2023-05-02 ENCOUNTER — Other Ambulatory Visit: Payer: Self-pay | Admitting: Student in an Organized Health Care Education/Training Program

## 2023-05-02 ENCOUNTER — Telehealth (INDEPENDENT_AMBULATORY_CARE_PROVIDER_SITE_OTHER): Payer: Self-pay

## 2023-05-02 MED ORDER — TRAMADOL HCL 50 MG PO TABS
50.0000 mg | ORAL_TABLET | Freq: Four times a day (QID) | ORAL | 0 refills | Status: DC | PRN
Start: 2023-05-02 — End: 2023-08-27

## 2023-05-02 MED ORDER — NALOXONE HCL 4 MG/0.1ML NA LIQD
NASAL | 0 refills | Status: AC
Start: 2023-05-02 — End: ?

## 2023-05-02 NOTE — Telephone Encounter (Signed)
I called the patient and let her know that the doctor further looked into her case and said he may be concerned that she could be having an issue with either her distal femur hardware or potential nonunion. Would be prudent to rule that out before prescribing pain medication. Agree that she should follow-up with an orthopaedic surgeon near her (or myself although that is very far if she has moved to or is living in, [place]) for evaluation to determine if this is just pain related or if there is a larger issue at hand. He has sent a very short course of tramadol for her to bridge the gap if needed but not a long term prescription.  Patient has appointment with Dr Sundra Aland July 9 and voiced appreciation.

## 2023-05-03 ENCOUNTER — Ambulatory Visit: Payer: BLUE CROSS/BLUE SHIELD | Admitting: Family

## 2023-05-07 ENCOUNTER — Ambulatory Visit (INDEPENDENT_AMBULATORY_CARE_PROVIDER_SITE_OTHER): Payer: Self-pay

## 2023-05-07 ENCOUNTER — Other Ambulatory Visit: Payer: Self-pay

## 2023-05-07 ENCOUNTER — Encounter (INDEPENDENT_AMBULATORY_CARE_PROVIDER_SITE_OTHER): Payer: Self-pay

## 2023-05-07 ENCOUNTER — Ambulatory Visit (INDEPENDENT_AMBULATORY_CARE_PROVIDER_SITE_OTHER)
Admission: RE | Admit: 2023-05-07 | Discharge: 2023-05-07 | Disposition: A | Discharge status: Still In Hospital | Payer: Medicare Other | Source: Ambulatory Visit

## 2023-05-07 DIAGNOSIS — M25561 Pain in right knee: Secondary | ICD-10-CM | POA: Diagnosis present

## 2023-05-07 DIAGNOSIS — M25562 Pain in left knee: Secondary | ICD-10-CM | POA: Diagnosis present

## 2023-05-07 DIAGNOSIS — M898X5 Other specified disorders of bone, thigh: Secondary | ICD-10-CM | POA: Insufficient documentation
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
## 2023-05-08 ENCOUNTER — Ambulatory Visit (INDEPENDENT_AMBULATORY_CARE_PROVIDER_SITE_OTHER): Payer: Self-pay

## 2023-05-14 ENCOUNTER — Ambulatory Visit: Payer: BLUE CROSS/BLUE SHIELD | Admitting: Family

## 2023-05-14 ENCOUNTER — Ambulatory Visit (INDEPENDENT_AMBULATORY_CARE_PROVIDER_SITE_OTHER): Payer: Medicare (Managed Care) | Admitting: Student in an Organized Health Care Education/Training Program

## 2023-05-20 ENCOUNTER — Other Ambulatory Visit: Payer: Self-pay

## 2023-05-20 ENCOUNTER — Ambulatory Visit
Admission: RE | Admit: 2023-05-20 | Discharge: 2023-05-20 | Disposition: A | Discharge status: Still In Hospital | Payer: Medicare Other | Source: Ambulatory Visit | Attending: ORTHOPAEDIC SURGERY | Admitting: ORTHOPAEDIC SURGERY

## 2023-05-20 NOTE — PT Treatment (Incomplete)
Outpatient Physical Therapy  [address]  [place], [address]  ([phone]  (Fax) [phone]    Physical Therapy Treatment Note    Date: 05/20/2023  Patient's Name: Jacqueline Norris  Date of Birth: 12-Apr-1961  Physical Therapy Visit    Visit #/POC: 2  Insurance Authorization: [employer]  POC Ends: 08/05/23  Next Progress Note Due: 06/06/23  PT Diagnosis/Reason for Referral: Pain of L femur [M89.8X5], Pain in both knees, unspecified chronicity [M25.561, M25.562]   Date of Onset/DOS: 01/25/23    Subjective: "I'm okay. I took a little tumble a couple days. It made my L one sore."    Objective:  Therapeutic Exercise:  STS - with UE assistance, without UE assistance 1 airex pad  Step ups/downs 4" B  SL shuttle presses 37.5#    Gait Training:   6 MWT - 858' 2 SPC    HEP: None    Assessment: The 6 MWT was performed and pt was able to ambulate 858' with 2 SPC. STS was added today to improve quad strength. She initially performed with assistance and then attempted without UE assistance, with 1 airex pad. She had forward trunk lean compensation. She attempted to perform without lean and wasn't able to do. Step ups/downs were added today to improve the same deficits. She was able to perform without hands breifly, but used her hands again because her L felt like it was going to give. She had the most difficulty stepping up leading with the L. Lastly, SL shuttle presses were added. She initially attempted with 50#, but it was too much weight. So, she decreased to 37.5# L, which was able to perform with good control. She reported a slight increase in pain on the medial aspect of knee. She found it to be much easier when performing it on her R with this weight. Patient would continue to benefit from physical therapy to address their impairments and functional limitations. Continue physical therapy plan of care to progress towards their goals.     Plan: Add knee drives.     Patient Active Problem List   Diagnosis     Headaches    Seizures    Pain of left femur    Right knee pain    Left knee pain     Goals:   Short Term Goals: (By: 06/06/23)  Patient will be independent with HEP for ROM/flexibility and strengthening for increased ease of ADLs.  Perform 6 MWT. MET 05/20/23  Perform SLS test.   Pt is able to complete the 30" STS without UE assistance, demonstrating improvements in transferring safely and functional quad strength.  Long Term Goals: (By: 07/06/23)  L hip flexion strength improves to 5/5, to allow for improvements in standing endurance when cooking  L knee flexion strength improves to 5/5, to allow for improvements in cleaning/mopping.  L knee extension strength improves to 5/5, to allow for improvements in stair navigation.  Pt is able to perform steps reciprocally.   Pt is able to transfer from a seated position, from the floor, so she can get out of her kayak.   6 MWT improves to 1000', demonstrating improvements in ambulation endurance.     Total Session Time: 26 minutes  Timed Minutes: 26 minutes  Intervention(s): Gait Training 12 minutes, Therapeutic Exercise 14 minutes    Signature: Mallie Darting, PT, DPT, ATC 05/20/2023 16:09-16:35

## 2023-05-22 ENCOUNTER — Ambulatory Visit (INDEPENDENT_AMBULATORY_CARE_PROVIDER_SITE_OTHER): Payer: Self-pay

## 2023-05-28 ENCOUNTER — Ambulatory Visit (INDEPENDENT_AMBULATORY_CARE_PROVIDER_SITE_OTHER): Payer: Self-pay

## 2023-05-30 ENCOUNTER — Ambulatory Visit (INDEPENDENT_AMBULATORY_CARE_PROVIDER_SITE_OTHER): Payer: Self-pay

## 2023-05-31 ENCOUNTER — Ambulatory Visit: Payer: BLUE CROSS/BLUE SHIELD | Admitting: Family

## 2023-06-04 ENCOUNTER — Ambulatory Visit (INDEPENDENT_AMBULATORY_CARE_PROVIDER_SITE_OTHER): Payer: Self-pay

## 2023-06-06 ENCOUNTER — Other Ambulatory Visit: Payer: Self-pay

## 2023-06-06 ENCOUNTER — Ambulatory Visit
Admission: RE | Admit: 2023-06-06 | Discharge: 2023-06-06 | Disposition: A | Discharge status: Still In Hospital | Payer: Medicare PPO | Source: Ambulatory Visit | Attending: ORTHOPAEDIC SURGERY | Admitting: ORTHOPAEDIC SURGERY

## 2023-06-06 DIAGNOSIS — M898X5 Other specified disorders of bone, thigh: Secondary | ICD-10-CM | POA: Insufficient documentation

## 2023-06-06 NOTE — PT Treatment (Signed)
Outpatient Physical Therapy  [address]  [place], [address]  ([phone]  (Fax) [phone]    Physical Therapy Progress Note    Date: 06/06/2023  Patient's Name: Jacqueline Norris  Date of Birth: June 08, 1961  Physical Therapy Visit    Visit #/POC: 3  Insurance Authorization: [employer]  POC Ends: 08/05/23  Next Progress Note Due: 06/06/23  PT Diagnosis/Reason for Referral: Pain of L femur [M89.8X5], Pain in both knees, unspecified chronicity [M25.561, M25.562]   Date of Onset/DOS: 01/25/23    Subjective: "Actually, I'm hurting pretty good. I took a little spill yesterday. My water bottle fell over. When I stood up, my L leg went out first and then the R went out. Then, I caught myself but I was halfway to the gournd. I caught myself with my arms."    Objective:  Therapeutic Exercise:  Upright bike (seat 5) x6'  30" STS - no UE assistance (unable to do)  STS - without UE assistance 1 airex pad  SLS - R 18.6" L unable to do  SL shuttle presses 37.5# x30 B  Step ups/downs 4" B    Gait Training: (held today)  6 MWT - 858' 2 SPC    HEP: None    Assessment: The upright bike was added today as a warmup exercise to improve knee motion after her fall. She reported tightness and it felt swollen on her L. She reported pain on B knees, but more significant on her L. A progress note was performed today. For SLS, she was able to perform it for 18.6" R, but she wasn't able to perform it on her L. She also attempted 30" STS without UE assistance and she wasn't able to perform this. Because of these improvements, patient met 2 short-term goals. An additional LTG was written. Patient would continue to benefit from physical therapy at a frequency of 2x/week, for an additional month, to progress towards their unmet goals.    Plan: Add knee drives.     Patient Active Problem List   Diagnosis    Headaches    Seizures    Pain of left femur    Right knee pain    Left knee pain     Goals:   Short Term Goals: (By:  06/06/23)  Patient will be independent with HEP for ROM/flexibility and strengthening for increased ease of ADLs. MET 06/06/23   Perform 6 MWT. MET 05/20/23  Perform SLS test. MET 06/06/23  Pt is able to complete the 30" STS without UE assistance, demonstrating improvements in transferring safely and functional quad strength. NOT MET  Long Term Goals: (By: 07/06/23)  L hip flexion strength improves to 5/5, to allow for improvements in standing endurance when cooking  L knee flexion strength improves to 5/5, to allow for improvements in cleaning/mopping.  L knee extension strength improves to 5/5, to allow for improvements in stair navigation.  Pt is able to perform steps reciprocally.   Pt is able to transfer from a seated position, from the floor, so she can get out of her kayak.   6 MWT improves to 1000', demonstrating improvements in ambulation endurance.   L SLS improves to 5", to decrease pt's fall risk.     Total Session Time: 25 minutes  Timed Minutes: 25 minutes  Intervention(s): Therapeutic Exercise 25 minutes    Signature: Mallie Darting, PT, DPT, ATC 06/06/2023 14:46-15:11

## 2023-06-07 ENCOUNTER — Ambulatory Visit (INDEPENDENT_AMBULATORY_CARE_PROVIDER_SITE_OTHER): Payer: Self-pay

## 2023-06-11 ENCOUNTER — Ambulatory Visit (INDEPENDENT_AMBULATORY_CARE_PROVIDER_SITE_OTHER): Payer: Self-pay

## 2023-06-11 ENCOUNTER — Ambulatory Visit (INDEPENDENT_AMBULATORY_CARE_PROVIDER_SITE_OTHER): Payer: Medicare (Managed Care) | Admitting: Student in an Organized Health Care Education/Training Program

## 2023-06-13 ENCOUNTER — Ambulatory Visit (INDEPENDENT_AMBULATORY_CARE_PROVIDER_SITE_OTHER): Payer: Self-pay

## 2023-06-13 ENCOUNTER — Ambulatory Visit: Payer: BLUE CROSS/BLUE SHIELD | Admitting: Family

## 2023-06-18 ENCOUNTER — Ambulatory Visit (INDEPENDENT_AMBULATORY_CARE_PROVIDER_SITE_OTHER): Payer: Self-pay

## 2023-06-20 ENCOUNTER — Ambulatory Visit (INDEPENDENT_AMBULATORY_CARE_PROVIDER_SITE_OTHER): Payer: Self-pay

## 2023-06-25 ENCOUNTER — Ambulatory Visit (INDEPENDENT_AMBULATORY_CARE_PROVIDER_SITE_OTHER): Payer: Self-pay

## 2023-06-27 ENCOUNTER — Ambulatory Visit (INDEPENDENT_AMBULATORY_CARE_PROVIDER_SITE_OTHER): Payer: Self-pay

## 2023-07-02 ENCOUNTER — Ambulatory Visit (INDEPENDENT_AMBULATORY_CARE_PROVIDER_SITE_OTHER): Payer: Medicare (Managed Care) | Admitting: Student in an Organized Health Care Education/Training Program

## 2023-07-04 ENCOUNTER — Ambulatory Visit: Payer: BLUE CROSS/BLUE SHIELD | Admitting: Family

## 2023-07-30 ENCOUNTER — Ambulatory Visit: Payer: BLUE CROSS/BLUE SHIELD | Admitting: Family

## 2023-07-30 ENCOUNTER — Ambulatory Visit (INDEPENDENT_AMBULATORY_CARE_PROVIDER_SITE_OTHER): Payer: Medicare (Managed Care) | Admitting: Student in an Organized Health Care Education/Training Program

## 2023-08-22 ENCOUNTER — Telehealth: Payer: Self-pay

## 2023-08-22 NOTE — Telephone Encounter (Signed)
Called and confirmed appointment for Laura Mclaughlin on 08/27/23 @ 11:00AM w Shelly Flatten, FNP. Inquired qbout XR CT CS  Flexion and Extension. Pt will obtain the XR a day prior to appt.       Sonnie Alamo, MA  10:36am

## 2023-08-26 ENCOUNTER — Ambulatory Visit
Admission: RE | Admit: 2023-08-26 | Discharge: 2023-08-26 | Disposition: A | Discharge status: Home / Self Care | Payer: Medicare (Managed Care) | Source: Ambulatory Visit | Attending: Family | Admitting: Family

## 2023-08-26 DIAGNOSIS — S12190D Other displaced fracture of second cervical vertebra, subsequent encounter for fracture with routine healing: Secondary | ICD-10-CM | POA: Insufficient documentation

## 2023-08-26 DIAGNOSIS — M4312 Spondylolisthesis, cervical region: Secondary | ICD-10-CM | POA: Insufficient documentation

## 2023-08-26 DIAGNOSIS — S12190S Other displaced fracture of second cervical vertebra, sequela: Secondary | ICD-10-CM | POA: Insufficient documentation

## 2023-08-26 DIAGNOSIS — M47812 Spondylosis without myelopathy or radiculopathy, cervical region: Secondary | ICD-10-CM | POA: Insufficient documentation

## 2023-08-27 ENCOUNTER — Ambulatory Visit
Admission: RE | Admit: 2023-08-27 | Discharge: 2023-08-27 | Disposition: A | Discharge status: Home / Self Care | Payer: Medicare (Managed Care) | Source: Ambulatory Visit | Attending: Student in an Organized Health Care Education/Training Program | Admitting: Student in an Organized Health Care Education/Training Program

## 2023-08-27 ENCOUNTER — Encounter (INDEPENDENT_AMBULATORY_CARE_PROVIDER_SITE_OTHER): Payer: Self-pay | Admitting: Student in an Organized Health Care Education/Training Program

## 2023-08-27 ENCOUNTER — Ambulatory Visit: Payer: Medicare (Managed Care) | Attending: Family | Admitting: Family

## 2023-08-27 ENCOUNTER — Ambulatory Visit (INDEPENDENT_AMBULATORY_CARE_PROVIDER_SITE_OTHER): Payer: Medicare (Managed Care) | Admitting: Student in an Organized Health Care Education/Training Program

## 2023-08-27 ENCOUNTER — Encounter: Payer: Self-pay | Admitting: Family

## 2023-08-27 ENCOUNTER — Ambulatory Visit (INDEPENDENT_AMBULATORY_CARE_PROVIDER_SITE_OTHER): Payer: Medicare (Managed Care)

## 2023-08-27 VITALS — BP 101/69 | HR 90 | Ht 69.0 in | Wt 228.0 lb

## 2023-08-27 VITALS — BP 123/83 | HR 104 | Ht 69.0 in | Wt 200.0 lb

## 2023-08-27 DIAGNOSIS — S12190S Other displaced fracture of second cervical vertebra, sequela: Secondary | ICD-10-CM | POA: Insufficient documentation

## 2023-08-27 DIAGNOSIS — T8484XA Pain due to internal orthopedic prosthetic devices, implants and grafts, initial encounter: Secondary | ICD-10-CM

## 2023-08-27 DIAGNOSIS — S12190D Other displaced fracture of second cervical vertebra, subsequent encounter for fracture with routine healing: Secondary | ICD-10-CM | POA: Insufficient documentation

## 2023-08-27 DIAGNOSIS — M13862 Other specified arthritis, left knee: Secondary | ICD-10-CM

## 2023-08-27 NOTE — Progress Notes (Signed)
Orthpaedic Trauma Follow Up Visit    Chief Complaint   Patient presents with    Follow-up     7 Months s/p left femur removal of Hardware DOS:01/26/23. Reports difficulty walking, needs new PT script, medication refill. RT wrist pain, believes it's from using the cane. PT 2x week, improving from starting point. Sharp twisting pain, medial left knee. Left foot is numb.        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  She subsequently had loosening of 2 of the distal screws and a retrograde nail and underwent screw removal on 01/25/2023.  She is now approximately 7 months out from that surgery.  Overall she has made excellent progress she walks with bilateral canes but is now able to stand and walk for significantly longer distances and is able to perform all ADLs independently.  She reports that she does continue to have persistent pain in her left knee, and occasional mild pain throughout her left lateral thigh.  Mild aches and pains in her right leg but this does not significantly bother her.  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  Her predominant issue at this time is her left knee pain.    Social History[1]    PE:   Vitals:    08/27/23 1425   BP: 123/83   Pulse: (!) 104     Right lower extremity:  Wounds healed. No discharge and no evidence of infection.  +DF/PF foot.  Sensation intact light touch dorsal/plantar foot.  + pedal pulse  No calf pain, negative Homan's.  There is no apparent instability with varus valgus stress.     Left lower extremity:  Incisions are healed without signs of infection, erythema, drainage  +DF/PF foot.  Sensation intact light touch dorsal/plantar foot.  + pedal pulse  No calf pain, negative Homan's.  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  There is increased callus formation and remodeling compared to prior xrays.  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  Left knee severe arthritis     Plan: I went over the radiographs, diagnosis, and treatment plan with the patient/family.      I had an extensive discussion with Laura Mclaughlin regarding her diagnosis, treatment option alternatives as well as risks and benefits.  We discussed both nonoperative and operative management.  She is currently wearing a small shoe lift and I encouraged her to increase the size as needed.  In the meantime I have explained to her the complexity of her current situation and the potential multifactorial etiology of her pain.  Her predominant symptoms are from her left knee and I do think this is secondary to severe arthritis.  There is the possibility of incomplete union/nonunion of her distal femur and I have recommended that we obtain a CT of her femur for better evaluation of this although I think it is less likely  than arthritic pain.  Depending on the findings we will proceed with next steps.  I discussed the potential for removal of hardware given the eventuality that she may require a knee replacement and her current intramedullary device will prevent that from being feasible.  Should she continue to have symptoms if we proceed with implant removal I would subsequently plan to proceed with intra-articular steroid injection for her left knee to see if this alleviates her pain which would be potentially diagnostic and therapeutic.  I discussed all of this with her and her husband and they are understanding agreement with the  plan as discussed.  I have ordered a CT of her femur noncontrast and I will plan to discuss her results with her thereafter if she would like to proceed with hardware removal and/or her femur is amenable depending on healing.  They are understanding and in agreement with the plan as discussed. All questions answered.      Xrays @ next clinic visit: Left femur x-rays       This note was generated within the [employer] EMR using Dragon medical speech recognition software and may contain inherent errors or omissions not intended by the user. Grammatical and punctuation errors, random word insertions, deletions, pronoun errors and incomplete sentences are occasional consequences of this technology due to software limitations. Not all errors are caught or corrected.  Although every attempt is made to root out erroneus and incomplete transcription, the note may still not fully represent the intent or opinion of the author. If there are questions or concerns about the content of this note or information contained within the body of this dictation they should be addressed directly with the author for clarification.         [1]   Social History  Tobacco Use    Smoking status: Never    Smokeless tobacco: Never   Vaping Use    Vaping status: Never Used   Substance Use Topics    Alcohol use: Never    Drug use: Never
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
## 2023-09-09 ENCOUNTER — Encounter (INDEPENDENT_AMBULATORY_CARE_PROVIDER_SITE_OTHER): Payer: Self-pay | Admitting: INTERNAL MEDICINE

## 2023-09-10 ENCOUNTER — Other Ambulatory Visit: Payer: Self-pay

## 2023-09-10 ENCOUNTER — Encounter (INDEPENDENT_AMBULATORY_CARE_PROVIDER_SITE_OTHER): Payer: Self-pay

## 2023-09-10 ENCOUNTER — Ambulatory Visit
Admission: RE | Admit: 2023-09-10 | Discharge: 2023-09-10 | Disposition: A | Discharge status: Still In Hospital | Payer: Medicare PPO | Source: Ambulatory Visit | Attending: ORTHOPAEDIC SURGERY | Admitting: ORTHOPAEDIC SURGERY

## 2023-09-10 DIAGNOSIS — M898X5 Other specified disorders of bone, thigh: Secondary | ICD-10-CM | POA: Insufficient documentation
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
## 2023-09-10 NOTE — Unmapped External Note (Signed)
 Outpatient Physical Therapy  [address]  [place], [address]  ([phone]  (Fax) [phone]    Physical Therapy Evaluation    Date: 09/10/2023  Patient's Name: Laura Mclaughlin  Date of Birth: [birth year]  Physical Therapy Evaluation

## 2023-09-12 ENCOUNTER — Ambulatory Visit (INDEPENDENT_AMBULATORY_CARE_PROVIDER_SITE_OTHER): Payer: Self-pay

## 2023-09-16 ENCOUNTER — Ambulatory Visit (INDEPENDENT_AMBULATORY_CARE_PROVIDER_SITE_OTHER): Payer: Self-pay

## 2023-09-17 ENCOUNTER — Encounter (INDEPENDENT_AMBULATORY_CARE_PROVIDER_SITE_OTHER): Payer: Self-pay | Admitting: Student in an Organized Health Care Education/Training Program

## 2023-09-17 ENCOUNTER — Other Ambulatory Visit (INDEPENDENT_AMBULATORY_CARE_PROVIDER_SITE_OTHER): Payer: Self-pay | Admitting: Student in an Organized Health Care Education/Training Program

## 2023-09-17 ENCOUNTER — Ambulatory Visit (INDEPENDENT_AMBULATORY_CARE_PROVIDER_SITE_OTHER): Payer: Medicare (Managed Care) | Admitting: Student in an Organized Health Care Education/Training Program

## 2023-09-17 VITALS — BP 138/80 | HR 98 | Ht 69.0 in | Wt 200.0 lb

## 2023-09-17 DIAGNOSIS — N183 Chronic kidney disease, stage 3 unspecified: Secondary | ICD-10-CM

## 2023-09-17 DIAGNOSIS — R9389 Abnormal findings on diagnostic imaging of other specified body structures: Secondary | ICD-10-CM

## 2023-09-17 DIAGNOSIS — S72402A Unspecified fracture of lower end of left femur, initial encounter for closed fracture: Secondary | ICD-10-CM

## 2023-09-17 DIAGNOSIS — M80052K Age-related osteoporosis with current pathological fracture, left femur, subsequent encounter for fracture with nonunion: Secondary | ICD-10-CM
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
## 2023-09-19 ENCOUNTER — Ambulatory Visit (INDEPENDENT_AMBULATORY_CARE_PROVIDER_SITE_OTHER): Payer: Self-pay

## 2023-09-19 ENCOUNTER — Encounter (INDEPENDENT_AMBULATORY_CARE_PROVIDER_SITE_OTHER): Payer: Self-pay | Admitting: Family

## 2023-09-19 ENCOUNTER — Ambulatory Visit (INDEPENDENT_AMBULATORY_CARE_PROVIDER_SITE_OTHER): Payer: Medicare (Managed Care) | Admitting: Family

## 2023-09-19 VITALS — BP 112/72 | HR 75 | Ht 69.0 in | Wt 220.0 lb

## 2023-09-19 DIAGNOSIS — K219 Gastro-esophageal reflux disease without esophagitis: Secondary | ICD-10-CM | POA: Insufficient documentation

## 2023-09-19 DIAGNOSIS — R768 Other specified abnormal immunological findings in serum: Secondary | ICD-10-CM

## 2023-09-19 DIAGNOSIS — R7689 Other specified abnormal immunological findings in serum: Secondary | ICD-10-CM

## 2023-09-19 DIAGNOSIS — R262 Difficulty in walking, not elsewhere classified: Secondary | ICD-10-CM

## 2023-09-19 DIAGNOSIS — Z9889 Other specified postprocedural states: Secondary | ICD-10-CM

## 2023-09-19 DIAGNOSIS — S72402A Unspecified fracture of lower end of left femur, initial encounter for closed fracture: Secondary | ICD-10-CM

## 2023-09-19 DIAGNOSIS — F32A Depression, unspecified: Secondary | ICD-10-CM

## 2023-09-19 DIAGNOSIS — Z9289 Personal history of other medical treatment: Secondary | ICD-10-CM

## 2023-09-19 DIAGNOSIS — G40909 Epilepsy, unspecified, not intractable, without status epilepticus: Secondary | ICD-10-CM

## 2023-09-19 DIAGNOSIS — R112 Nausea with vomiting, unspecified: Secondary | ICD-10-CM

## 2023-09-19 DIAGNOSIS — Z01818 Encounter for other preprocedural examination: Secondary | ICD-10-CM

## 2023-09-19 DIAGNOSIS — E039 Hypothyroidism, unspecified: Secondary | ICD-10-CM

## 2023-09-19 DIAGNOSIS — E66811 Obesity, class 1: Secondary | ICD-10-CM

## 2023-09-19 HISTORY — DX: Other specified abnormal immunological findings in serum: R76.89

## 2023-09-19 HISTORY — DX: Nausea with vomiting, unspecified: R11.2

## 2023-09-19 HISTORY — DX: Personal history of other medical treatment: Z92.89

## 2023-09-19 NOTE — PEC In-Person Visit (H&P) (Addendum)
 Pre-Anesthesia Evaluation     Pre-op Interview visit requested by:   Reason for pre-op interview visit: Patient anticipating LEFT REPAIR, NONUNION, FEMUR W/ GRAFT procedure.         History of Present Illness/Summary:  Patient presents to the [facility] clinic

## 2023-09-20 ENCOUNTER — Other Ambulatory Visit (FREE_STANDING_LABORATORY_FACILITY): Payer: Medicare (Managed Care)

## 2023-09-20 ENCOUNTER — Ambulatory Visit: Payer: Medicare (Managed Care) | Attending: Family | Admitting: Family

## 2023-09-20 ENCOUNTER — Telehealth (INDEPENDENT_AMBULATORY_CARE_PROVIDER_SITE_OTHER): Payer: Self-pay

## 2023-09-20 DIAGNOSIS — I959 Hypotension, unspecified: Secondary | ICD-10-CM | POA: Diagnosis not present

## 2023-09-20 DIAGNOSIS — Z9884 Bariatric surgery status: Secondary | ICD-10-CM

## 2023-09-20 DIAGNOSIS — X58XXXA Exposure to other specified factors, initial encounter: Secondary | ICD-10-CM | POA: Diagnosis present

## 2023-09-20 DIAGNOSIS — R768 Other specified abnormal immunological findings in serum: Secondary | ICD-10-CM

## 2023-09-20 DIAGNOSIS — F32A Depression, unspecified: Secondary | ICD-10-CM | POA: Diagnosis present

## 2023-09-20 DIAGNOSIS — E663 Overweight: Secondary | ICD-10-CM | POA: Diagnosis present

## 2023-09-20 DIAGNOSIS — R710 Precipitous drop in hematocrit: Secondary | ICD-10-CM | POA: Diagnosis not present

## 2023-09-20 DIAGNOSIS — Z01818 Encounter for other preprocedural examination: Secondary | ICD-10-CM

## 2023-09-20 DIAGNOSIS — Z79890 Hormone replacement therapy (postmenopausal): Secondary | ICD-10-CM

## 2023-09-20 DIAGNOSIS — G2581 Restless legs syndrome: Secondary | ICD-10-CM | POA: Diagnosis present

## 2023-09-20 DIAGNOSIS — M329 Systemic lupus erythematosus, unspecified: Secondary | ICD-10-CM | POA: Diagnosis present

## 2023-09-20 DIAGNOSIS — Z9289 Personal history of other medical treatment: Secondary | ICD-10-CM

## 2023-09-20 DIAGNOSIS — Z6835 Body mass index (BMI) 35.0-35.9, adult: Secondary | ICD-10-CM

## 2023-09-20 DIAGNOSIS — S72402K Unspecified fracture of lower end of left femur, subsequent encounter for closed fracture with nonunion: Principal | ICD-10-CM

## 2023-09-20 DIAGNOSIS — E039 Hypothyroidism, unspecified: Secondary | ICD-10-CM | POA: Diagnosis present

## 2023-09-20 DIAGNOSIS — S72402A Unspecified fracture of lower end of left femur, initial encounter for closed fracture: Secondary | ICD-10-CM

## 2023-09-20 DIAGNOSIS — G629 Polyneuropathy, unspecified: Secondary | ICD-10-CM | POA: Diagnosis present

## 2023-09-20 DIAGNOSIS — Z79899 Other long term (current) drug therapy: Secondary | ICD-10-CM

## 2023-09-20 LAB — COMPREHENSIVE METABOLIC PANEL
ALT: 21 U/L (ref <=55)
AST (SGOT): 17 U/L (ref <=41)
Albumin/Globulin Ratio: 1.1 (ref 0.9–2.2)
Albumin: 3.3 g/dL — ABNORMAL LOW (ref 3.5–5.0)
Alkaline Phosphatase: 126 U/L — ABNORMAL HIGH (ref 37–117)
Anion Gap: 6 (ref 5.0–15.0)
BUN: 12 mg/dL (ref 7–21)
Bilirubin, Total: 0.3 mg/dL (ref 0.2–1.2)
CO2: 23 meq/L (ref 17–29)
Calcium: 8.5 mg/dL (ref 8.5–10.5)
Chloride: 111 meq/L (ref 99–111)
Creatinine: 0.8 mg/dL (ref 0.4–1.0)
GFR: >60 mL/min/{1.73_m2} (ref >=60.0)
Globulin: 3 g/dL (ref 2.0–3.6)
Glucose: 86 mg/dL (ref 70–100)
Hemolysis Index: 7 {index}
Potassium: 3.9 meq/L (ref 3.5–5.3)
Protein, Total: 6.3 g/dL (ref 6.0–8.3)
Sodium: 140 meq/L (ref 135–145)

## 2023-09-20 LAB — LAB USE ONLY - CBC WITH DIFFERENTIAL
Absolute Basophils: 0.02 10*3/uL (ref 0.00–0.08)
Absolute Eosinophils: 0.09 10*3/uL (ref 0.00–0.44)
Absolute Immature Granulocytes: 0.01 10*3/uL (ref 0.00–0.07)
Absolute Lymphocytes: 1.29 10*3/uL (ref 0.42–3.22)
Absolute Monocytes: 0.22 10*3/uL (ref 0.21–0.85)
Absolute Neutrophils: 3.79 10*3/uL (ref 1.10–6.33)
Absolute nRBC: 0 10*3/uL (ref <=0.00)
Basophils %: 0.4 %
Eosinophils %: 1.7 %
Hematocrit: 35.4 % (ref 34.7–43.7)
Hemoglobin: 11.4 g/dL (ref 11.4–14.8)
Immature Granulocytes %: 0.2 %
Lymphocytes %: 23.8 %
MCH: 29.5 pg (ref 25.1–33.5)
MCHC: 32.2 g/dL (ref 31.5–35.8)
MCV: 91.7 fL (ref 78.0–96.0)
MPV: 9.9 fL (ref 8.9–12.5)
Monocytes %: 4.1 %
Neutrophils %: 69.8 %
Platelet Count: 249 10*3/uL (ref 142–346)
Preliminary Absolute Neutrophil Count: 3.79 10*3/uL (ref 1.10–6.33)
RBC: 3.86 10*6/uL — ABNORMAL LOW (ref 3.90–5.10)
RDW: 14 % (ref 11–15)
WBC: 5.42 10*3/uL (ref 3.10–9.50)
nRBC %: 0 /100{WBCs} (ref <=0.0)

## 2023-09-20 LAB — C-REACTIVE PROTEIN: C-Reactive Protein: 0.2 mg/dL (ref 0.0–1.1)

## 2023-09-20 LAB — PTH, INTACT: PTH Intact: 124.7 pg/mL — ABNORMAL HIGH (ref 18.0–89.0)

## 2023-09-20 LAB — TYPE AND SCREEN
ABO Rh: A NEG
Antibody Screen: POSITIVE

## 2023-09-20 LAB — ANTIBODY IDENTIFICATION,  REFLEX
Antibody ID: POSITIVE
Antibody ID: POSITIVE

## 2023-09-20 LAB — LUTEINIZING HORMONE: LH: 15 m[IU]/mL

## 2023-09-20 LAB — TESTOSTERONE: Testosterone: 14 ng/dL (ref 14–76)

## 2023-09-20 LAB — FOLLICLE STIMULATING HORMONE: Follicle Stimulating Hormone: 35.19 m[IU]/mL

## 2023-09-20 LAB — SEDIMENTATION RATE: Sed Rate: 2 mm/h (ref <=20)

## 2023-09-20 LAB — PRESURGICAL FORM, REFLEX

## 2023-09-20 LAB — TSH: TSH: 0.48 u[IU]/mL (ref 0.35–4.94)

## 2023-09-20 LAB — C ANTIGEN, REFLEX: C Antigen: NEGATIVE

## 2023-09-20 LAB — VITAMIN D, 25 OH, TOTAL: Vitamin D 25-OH, Total: 19 ng/mL — ABNORMAL LOW (ref 30–100)

## 2023-09-20 LAB — TRANSFERRIN: Transferrin: 244 mg/dL (ref 174–382)

## 2023-09-20 LAB — PREALBUMIN: Prealbumin: 16.3 mg/dL (ref 16.0–38.0)

## 2023-09-20 NOTE — Telephone Encounter (Signed)
 Called patient to confirm arrival time of 8am.  Patient confirmed, NPO at midnight

## 2023-09-23 ENCOUNTER — Ambulatory Visit (INDEPENDENT_AMBULATORY_CARE_PROVIDER_SITE_OTHER): Payer: Self-pay

## 2023-09-23 ENCOUNTER — Encounter (HOSPITAL_BASED_OUTPATIENT_CLINIC_OR_DEPARTMENT_OTHER)
Admission: RE | Disposition: A | Discharge status: Home Health | Payer: Self-pay | Source: Home / Self Care | Attending: Student in an Organized Health Care Education/Training Program

## 2023-09-23 ENCOUNTER — Inpatient Hospital Stay
Admission: RE | Admit: 2023-09-23 | Discharge: 2023-10-05 | DRG: 481 | Disposition: A | Discharge status: Rehabilitation Facility | Payer: Medicare (Managed Care) | Attending: Student in an Organized Health Care Education/Training Program | Admitting: Student in an Organized Health Care Education/Training Program

## 2023-09-23 ENCOUNTER — Ambulatory Visit: Payer: Medicare (Managed Care)

## 2023-09-23 ENCOUNTER — Inpatient Hospital Stay: Payer: Medicare (Managed Care)

## 2023-09-23 ENCOUNTER — Encounter: Payer: Self-pay | Admitting: Student in an Organized Health Care Education/Training Program

## 2023-09-23 ENCOUNTER — Other Ambulatory Visit: Payer: Self-pay

## 2023-09-23 DIAGNOSIS — Z9884 Bariatric surgery status: Secondary | ICD-10-CM

## 2023-09-23 DIAGNOSIS — I959 Hypotension, unspecified: Secondary | ICD-10-CM | POA: Diagnosis not present

## 2023-09-23 DIAGNOSIS — E663 Overweight: Secondary | ICD-10-CM | POA: Diagnosis present

## 2023-09-23 DIAGNOSIS — M329 Systemic lupus erythematosus, unspecified: Secondary | ICD-10-CM | POA: Diagnosis present

## 2023-09-23 DIAGNOSIS — S72402A Unspecified fracture of lower end of left femur, initial encounter for closed fracture: Principal | ICD-10-CM | POA: Diagnosis present

## 2023-09-23 DIAGNOSIS — Z79890 Hormone replacement therapy (postmenopausal): Secondary | ICD-10-CM

## 2023-09-23 DIAGNOSIS — Z6835 Body mass index (BMI) 35.0-35.9, adult: Secondary | ICD-10-CM

## 2023-09-23 DIAGNOSIS — F32A Depression, unspecified: Secondary | ICD-10-CM | POA: Diagnosis present

## 2023-09-23 DIAGNOSIS — G629 Polyneuropathy, unspecified: Secondary | ICD-10-CM | POA: Diagnosis present

## 2023-09-23 DIAGNOSIS — E039 Hypothyroidism, unspecified: Secondary | ICD-10-CM | POA: Diagnosis present

## 2023-09-23 DIAGNOSIS — Z79899 Other long term (current) drug therapy: Secondary | ICD-10-CM

## 2023-09-23 DIAGNOSIS — R710 Precipitous drop in hematocrit: Secondary | ICD-10-CM | POA: Diagnosis not present

## 2023-09-23 DIAGNOSIS — S72402K Unspecified fracture of lower end of left femur, subsequent encounter for closed fracture with nonunion: Principal | ICD-10-CM

## 2023-09-23 DIAGNOSIS — G2581 Restless legs syndrome: Secondary | ICD-10-CM | POA: Diagnosis present

## 2023-09-23 DIAGNOSIS — X58XXXA Exposure to other specified factors, initial encounter: Secondary | ICD-10-CM | POA: Diagnosis present

## 2023-09-23 HISTORY — PX: REPAIR, NONUNION, FEMUR W/ GRAFT: SHX50042

## 2023-09-23 LAB — PT/INR
INR: 1.1 (ref 0.9–1.1)
PT: 11.9 s (ref 10.1–12.9)

## 2023-09-23 LAB — LAB USE ONLY - ACID FAST BACILLI STAIN
Acid Fast Bacilli Stain: NONE SEEN
Acid Fast Bacilli Stain: NONE SEEN

## 2023-09-23 SURGERY — REPAIR, NONUNION, FEMUR W/ GRAFT
Anesthesia: Anesthesia General | Laterality: Left | Wound class: Clean

## 2023-09-23 MED ORDER — OXYCODONE HCL 10 MG PO TABS
10.0000 mg | ORAL_TABLET | ORAL | Status: DC | PRN
Start: 2023-09-23 — End: 2023-10-05
  Administered 2023-09-23 – 2023-09-24 (×4): 10 mg via ORAL
  Filled 2023-09-23 (×5): qty 1

## 2023-09-23 MED ORDER — ROCURONIUM BROMIDE 50 MG/5ML IV SOLN
INTRAVENOUS | Status: AC
Start: 2023-09-23 — End: ?
  Filled 2023-09-23: qty 5

## 2023-09-23 MED ORDER — MIDAZOLAM HCL 1 MG/ML IJ SOLN (WRAP)
INTRAMUSCULAR | Status: AC
Start: 2023-09-23 — End: ?
  Filled 2023-09-23: qty 2

## 2023-09-23 MED ORDER — LIDOCAINE HCL (PF) 1 % IJ SOLN
INTRAMUSCULAR | Status: AC
Start: 2023-09-23 — End: ?
  Filled 2023-09-23: qty 5

## 2023-09-23 MED ORDER — DESVENLAFAXINE SUCCINATE ER 50 MG PO TB24
100.0000 mg | ORAL_TABLET | Freq: Every day | ORAL | Status: DC
Start: 2023-09-24 — End: 2023-10-05
  Administered 2023-09-24 – 2023-10-05 (×12): 100 mg via ORAL
  Filled 2023-09-23 (×14): qty 2

## 2023-09-23 MED ORDER — GLYCOPYRROLATE 0.2 MG/ML IJ SOLN (WRAP)
INTRAMUSCULAR | Status: DC | PRN
Start: 2023-09-23 — End: 2023-09-23
  Administered 2023-09-23: .1 mg via INTRAVENOUS

## 2023-09-23 MED ORDER — PATIENT SUPPLIED NON FORMULARY
3.0000 mg | Status: DC
Start: 2023-09-23 — End: 2023-09-27

## 2023-09-23 MED ORDER — ONDANSETRON HCL 4 MG PO TABS
4.0000 mg | ORAL_TABLET | Freq: Three times a day (TID) | ORAL | Status: DC | PRN
Start: 2023-09-23 — End: 2023-10-05
  Administered 2023-09-29 – 2023-10-05 (×2): 4 mg via ORAL
  Filled 2023-09-23 (×2): qty 1

## 2023-09-23 MED ORDER — LAMOTRIGINE 25 MG PO TABS
150.0000 mg | ORAL_TABLET | Freq: Two times a day (BID) | ORAL | Status: DC
Start: 2023-09-23 — End: 2023-10-05
  Administered 2023-09-23 – 2023-10-05 (×24): 150 mg via ORAL
  Filled 2023-09-23 (×26): qty 2

## 2023-09-23 MED ORDER — MAGNESIUM HYDROXIDE 400 MG/5ML PO SUSP
10.0000 mL | Freq: Every day | ORAL | Status: DC | PRN
Start: 2023-09-23 — End: 2023-10-05
  Administered 2023-09-25 – 2023-09-26 (×3): 10 mL via ORAL
  Filled 2023-09-23 (×3): qty 30

## 2023-09-23 MED ORDER — SODIUM CHLORIDE 0.9 % IV SOLN
Freq: Once | INTRAVENOUS | Status: AC | PRN
Start: 2023-09-23 — End: 2023-09-23
  Administered 2023-09-23: 50 mL via PERINEURAL

## 2023-09-23 MED ORDER — TRAZODONE HCL 100 MG PO TABS
300.0000 mg | ORAL_TABLET | Freq: Every evening | ORAL | Status: DC
Start: 2023-09-23 — End: 2023-10-05
  Administered 2023-09-23 – 2023-10-04 (×12): 300 mg via ORAL
  Filled 2023-09-23 (×14): qty 3

## 2023-09-23 MED ORDER — VITAMIN D (ERGOCALCIFEROL) 1.25 MG (50000 UT) PO CAPS
50000.0000 [IU] | ORAL_CAPSULE | ORAL | Status: DC
Start: 2023-09-23 — End: 2023-10-05
  Administered 2023-09-23 – 2023-09-30 (×2): 50000 [IU] via ORAL
  Filled 2023-09-23 (×2): qty 1

## 2023-09-23 MED ORDER — STERILE WATER FOR INJECTION IJ/IV SOLN (WRAP)
1.0000 g | Freq: Three times a day (TID) | INTRAMUSCULAR | Status: AC
Start: 2023-09-23 — End: 2023-09-24
  Administered 2023-09-23: 1 g via INTRAVENOUS
  Filled 2023-09-23: qty 1000

## 2023-09-23 MED ORDER — SUGAMMADEX SODIUM 200 MG/2ML IV SOLN
INTRAVENOUS | Status: DC | PRN
Start: 2023-09-23 — End: 2023-09-23
  Administered 2023-09-23: 200 mg via INTRAVENOUS

## 2023-09-23 MED ORDER — PANTOPRAZOLE SODIUM 40 MG PO TBEC
40.0000 mg | DELAYED_RELEASE_TABLET | Freq: Every morning | ORAL | Status: DC
Start: 2023-09-23 — End: 2023-10-05
  Administered 2023-09-24 – 2023-10-05 (×12): 40 mg via ORAL
  Filled 2023-09-23 (×12): qty 1

## 2023-09-23 MED ORDER — TOPIRAMATE 25 MG PO TABS
150.0000 mg | ORAL_TABLET | Freq: Two times a day (BID) | ORAL | Status: DC
Start: 2023-09-23 — End: 2023-10-05
  Administered 2023-09-23 – 2023-10-05 (×24): 150 mg via ORAL
  Filled 2023-09-23 (×29): qty 2

## 2023-09-23 MED ORDER — ASPIRIN 81 MG PO TBEC
81.0000 mg | DELAYED_RELEASE_TABLET | Freq: Two times a day (BID) | ORAL | Status: DC
Start: 2023-09-23 — End: 2023-10-05
  Administered 2023-09-23 – 2023-10-05 (×24): 81 mg via ORAL
  Filled 2023-09-23 (×24): qty 1

## 2023-09-23 MED ORDER — ONDANSETRON HCL 4 MG/2ML IJ SOLN
4.0000 mg | Freq: Three times a day (TID) | INTRAMUSCULAR | Status: DC | PRN
Start: 2023-09-23 — End: 2023-10-05
  Filled 2023-09-23 (×2): qty 2

## 2023-09-23 MED ORDER — SODIUM CHLORIDE (PF) 0.9 % IJ SOLN
INTRAMUSCULAR | Status: AC
Start: 2023-09-23 — End: ?
  Filled 2023-09-23: qty 10

## 2023-09-23 MED ORDER — CEFAZOLIN SODIUM 2 G IJ SOLR
INTRAMUSCULAR | Status: AC
Start: 2023-09-23 — End: ?
  Filled 2023-09-23: qty 2000

## 2023-09-23 MED ORDER — OXYCODONE HCL 5 MG PO TABS
5.0000 mg | ORAL_TABLET | Freq: Once | ORAL | Status: AC | PRN
Start: 2023-09-23 — End: 2023-09-23

## 2023-09-23 MED ORDER — VANCOMYCIN HCL 1 G IV SOLR
INTRAVENOUS | Status: AC
Start: 2023-09-23 — End: ?
  Filled 2023-09-23: qty 1000

## 2023-09-23 MED ORDER — HYDROMORPHONE HCL 1 MG/ML IJ SOLN
0.5000 mg | INTRAMUSCULAR | Status: DC | PRN
Start: 2023-09-23 — End: 2023-09-23

## 2023-09-23 MED ORDER — OXYCODONE HCL 5 MG PO TABS
5.0000 mg | ORAL_TABLET | ORAL | Status: DC | PRN
Start: 2023-09-23 — End: 2023-10-05
  Administered 2023-09-27 – 2023-10-05 (×11): 5 mg via ORAL
  Filled 2023-09-23 (×11): qty 1

## 2023-09-23 MED ORDER — METHOCARBAMOL 500 MG PO TABS
1000.0000 mg | ORAL_TABLET | Freq: Four times a day (QID) | ORAL | Status: DC
Start: 2023-09-23 — End: 2023-10-05
  Administered 2023-09-23 – 2023-10-05 (×45): 1000 mg via ORAL
  Filled 2023-09-23 (×46): qty 2

## 2023-09-23 MED ORDER — HYDROMORPHONE HCL 1 MG/ML IJ SOLN
INTRAMUSCULAR | Status: AC
Start: 2023-09-23 — End: 2023-09-23
  Administered 2023-09-23: 0.5 mg via INTRAVENOUS
  Filled 2023-09-23: qty 1

## 2023-09-23 MED ORDER — GABAPENTIN 300 MG PO CAPS
ORAL_CAPSULE | ORAL | Status: AC
Start: 2023-09-23 — End: 2023-09-23
  Administered 2023-09-23: 300 mg via ORAL
  Filled 2023-09-23: qty 1

## 2023-09-23 MED ORDER — ONDANSETRON HCL 4 MG/2ML IJ SOLN
INTRAMUSCULAR | Status: DC | PRN
Start: 2023-09-23 — End: 2023-09-23
  Administered 2023-09-23: 4 mg via INTRAVENOUS

## 2023-09-23 MED ORDER — LORAZEPAM 1 MG PO TABS
0.5000 mg | ORAL_TABLET | Freq: Three times a day (TID) | ORAL | Status: DC
Start: 2023-09-23 — End: 2023-10-05
  Administered 2023-09-23 – 2023-10-05 (×34): 0.5 mg via ORAL
  Filled 2023-09-23 (×36): qty 1

## 2023-09-23 MED ORDER — ROPIVACAINE HCL 5 MG/ML IJ SOLN
INTRAMUSCULAR | Status: AC
Start: 2023-09-23 — End: ?
  Filled 2023-09-23: qty 30

## 2023-09-23 MED ORDER — DEXAMETHASONE SODIUM PHOSPHATE 4 MG/ML IJ SOLN
INTRAMUSCULAR | Status: DC | PRN
Start: 2023-09-23 — End: 2023-09-23
  Administered 2023-09-23: 4 mg via INTRAVENOUS

## 2023-09-23 MED ORDER — LIDOCAINE HCL 2 % IJ SOLN
INTRAMUSCULAR | Status: DC | PRN
Start: 2023-09-23 — End: 2023-09-23
  Administered 2023-09-23: 40 mg

## 2023-09-23 MED ORDER — GABAPENTIN 100 MG PO CAPS
300.0000 mg | ORAL_CAPSULE | Freq: Three times a day (TID) | ORAL | Status: DC
Start: 2023-09-23 — End: 2023-10-05
  Administered 2023-09-23 – 2023-10-05 (×35): 300 mg via ORAL
  Filled 2023-09-23 (×4): qty 3
  Filled 2023-09-23 (×2): qty 1
  Filled 2023-09-23: qty 3
  Filled 2023-09-23: qty 1
  Filled 2023-09-23: qty 3
  Filled 2023-09-23: qty 1
  Filled 2023-09-23 (×2): qty 3
  Filled 2023-09-23: qty 1
  Filled 2023-09-23 (×3): qty 3
  Filled 2023-09-23 (×2): qty 1
  Filled 2023-09-23: qty 3
  Filled 2023-09-23: qty 1
  Filled 2023-09-23 (×2): qty 3
  Filled 2023-09-23: qty 1
  Filled 2023-09-23 (×2): qty 3
  Filled 2023-09-23: qty 1
  Filled 2023-09-23: qty 3
  Filled 2023-09-23: qty 1
  Filled 2023-09-23 (×2): qty 3
  Filled 2023-09-23 (×5): qty 1
  Filled 2023-09-23: qty 3

## 2023-09-23 MED ORDER — ALBUMIN HUMAN/BIOSIMILIAR 5% IV SOLN (WRAP)
INTRAVENOUS | Status: DC | PRN
Start: 2023-09-23 — End: 2023-09-23

## 2023-09-23 MED ORDER — OXYCODONE HCL 5 MG PO TABS
2.5000 mg | ORAL_TABLET | ORAL | Status: DC | PRN
Start: 2023-09-23 — End: 2023-10-05
  Administered 2023-09-30 – 2023-10-03 (×3): 2.5 mg via ORAL
  Filled 2023-09-23 (×3): qty 1

## 2023-09-23 MED ORDER — TOBRAMYCIN SULFATE 1.2 G IJ SOLR
INTRAMUSCULAR | Status: AC
Start: 2023-09-23 — End: ?
  Filled 2023-09-23: qty 1200

## 2023-09-23 MED ORDER — LEVOTHYROXINE SODIUM 75 MCG PO TABS
75.0000 ug | ORAL_TABLET | Freq: Every day | ORAL | Status: DC
Start: 2023-09-24 — End: 2023-10-05
  Administered 2023-09-24 – 2023-10-05 (×12): 75 ug via ORAL
  Filled 2023-09-23 (×13): qty 1

## 2023-09-23 MED ORDER — NALOXONE HCL 0.4 MG/ML IJ SOLN (WRAP)
0.4000 mg | INTRAMUSCULAR | Status: DC | PRN
Start: 2023-09-23 — End: 2023-10-05

## 2023-09-23 MED ORDER — ONDANSETRON HCL 4 MG/2ML IJ SOLN
4.0000 mg | Freq: Once | INTRAMUSCULAR | Status: DC | PRN
Start: 2023-09-23 — End: 2023-09-23

## 2023-09-23 MED ORDER — SENNOSIDES-DOCUSATE SODIUM 8.6-50 MG PO TABS
2.0000 | ORAL_TABLET | Freq: Two times a day (BID) | ORAL | Status: DC
Start: 2023-09-23 — End: 2023-10-05
  Administered 2023-09-23 – 2023-10-05 (×24): 2 via ORAL
  Filled 2023-09-23 (×24): qty 2

## 2023-09-23 MED ORDER — SODIUM CHLORIDE 0.9 % IR SOLN
Status: DC | PRN
Start: 2023-09-23 — End: 2023-09-23
  Administered 2023-09-23: 1000 mL

## 2023-09-23 MED ORDER — ACETAMINOPHEN 325 MG PO TABS
650.0000 mg | ORAL_TABLET | ORAL | Status: DC | PRN
Start: 2023-09-23 — End: 2023-10-05
  Administered 2023-09-24: 650 mg via ORAL
  Filled 2023-09-23: qty 2

## 2023-09-23 MED ORDER — TOBRAMYCIN SULFATE 1.2 G IJ SOLR
INTRAMUSCULAR | Status: DC | PRN
Start: 2023-09-23 — End: 2023-09-23
  Administered 2023-09-23: 1200 mg

## 2023-09-23 MED ORDER — EPHEDRINE SULFATE 50 MG/ML IJ/IV SOLN (WRAP)
Status: DC | PRN
Start: 2023-09-23 — End: 2023-09-23
  Administered 2023-09-23: 5 mg via INTRAVENOUS

## 2023-09-23 MED ORDER — TRANEXAMIC ACID-NACL 1000-0.7 MG/100ML-% IV SOLN
INTRAVENOUS | Status: AC
Start: 2023-09-23 — End: ?
  Filled 2023-09-23: qty 100

## 2023-09-23 MED ORDER — LAMOTRIGINE 100 MG PO TABS
100.0000 mg | ORAL_TABLET | Freq: Two times a day (BID) | ORAL | Status: DC
Start: 2023-09-23 — End: 2023-09-23

## 2023-09-23 MED ORDER — CLOBAZAM 10 MG PO TABS
20.0000 mg | ORAL_TABLET | Freq: Every evening | ORAL | Status: DC
Start: 2023-09-23 — End: 2023-10-05
  Administered 2023-09-23 – 2023-10-04 (×12): 20 mg via ORAL
  Filled 2023-09-23 (×12): qty 2

## 2023-09-23 MED ORDER — FENTANYL CITRATE (PF) 50 MCG/ML IJ SOLN (WRAP)
INTRAMUSCULAR | Status: DC | PRN
Start: 2023-09-23 — End: 2023-09-23
  Administered 2023-09-23 (×2): 50 ug via INTRAVENOUS

## 2023-09-23 MED ORDER — OXYCODONE HCL 5 MG PO TABS
ORAL_TABLET | ORAL | Status: AC
Start: 2023-09-23 — End: 2023-09-23
  Administered 2023-09-23: 5 mg via ORAL
  Filled 2023-09-23: qty 1

## 2023-09-23 MED ORDER — MIDAZOLAM HCL 1 MG/ML IJ SOLN (WRAP)
2.0000 mg | Freq: Once | INTRAMUSCULAR | Status: DC
Start: 2023-09-23 — End: 2023-09-23

## 2023-09-23 MED ORDER — METHOCARBAMOL 500 MG PO TABS
ORAL_TABLET | ORAL | Status: AC
Start: 2023-09-23 — End: 2023-09-23
  Administered 2023-09-23: 1000 mg via ORAL
  Filled 2023-09-23: qty 2

## 2023-09-23 MED ORDER — VANCOMYCIN HCL 1 G IV SOLR
INTRAVENOUS | Status: DC | PRN
Start: 2023-09-23 — End: 2023-09-23
  Administered 2023-09-23: 1000 mg

## 2023-09-23 MED ORDER — FENTANYL CITRATE (PF) 50 MCG/ML IJ SOLN (WRAP)
INTRAMUSCULAR | Status: AC
Start: 2023-09-23 — End: ?
  Filled 2023-09-23: qty 2

## 2023-09-23 MED ORDER — PHENYLEPHRINE 100 MCG/ML IV SYRINGE FOR INFUSION (ANESTHESIA)
PREFILLED_SYRINGE | INTRAVENOUS | Status: DC | PRN
Start: 2023-09-23 — End: 2023-09-23
  Administered 2023-09-23: 30 ug/min via INTRAVENOUS

## 2023-09-23 MED ORDER — HYDROMORPHONE HCL 1 MG/ML IJ SOLN
0.5000 mg | INTRAMUSCULAR | Status: AC | PRN
Start: 2023-09-23 — End: 2023-09-23
  Administered 2023-09-23 (×2): 0.5 mg via INTRAVENOUS

## 2023-09-23 MED ORDER — LACTATED RINGERS IV SOLN
INTRAVENOUS | Status: DC | PRN
Start: 2023-09-23 — End: 2023-09-23

## 2023-09-23 MED ORDER — PROPOFOL INFUSION 10 MG/ML
INTRAVENOUS | Status: DC | PRN
Start: 2023-09-23 — End: 2023-09-23
  Administered 2023-09-23: 50 ug/kg/min via INTRAVENOUS

## 2023-09-23 MED ORDER — TRANEXAMIC ACID 2000MG/100 ML ADULT BOLUS
Status: DC | PRN
Start: 2023-09-23 — End: 2023-09-23
  Administered 2023-09-23: 1000 mg via INTRAVENOUS

## 2023-09-23 MED ORDER — PROPOFOL 10 MG/ML IV EMUL (WRAP)
INTRAVENOUS | Status: DC | PRN
Start: 2023-09-23 — End: 2023-09-23
  Administered 2023-09-23: 200 mg via INTRAVENOUS

## 2023-09-23 MED ORDER — ROCURONIUM BROMIDE 10 MG/ML IV SOLN (WRAP)
INTRAVENOUS | Status: DC | PRN
Start: 2023-09-23 — End: 2023-09-23
  Administered 2023-09-23: 50 mg via INTRAVENOUS
  Administered 2023-09-23: 30 mg via INTRAVENOUS
  Administered 2023-09-23 (×2): 20 mg via INTRAVENOUS

## 2023-09-23 MED ORDER — CEFAZOLIN SODIUM 1 G IJ SOLR
INTRAMUSCULAR | Status: AC
Start: 2023-09-23 — End: ?
  Filled 2023-09-23: qty 2000

## 2023-09-23 MED ORDER — CEFAZOLIN SODIUM 1 G IJ SOLR
INTRAMUSCULAR | Status: DC | PRN
Start: 2023-09-23 — End: 2023-09-23
  Administered 2023-09-23 (×2): 2 g via INTRAVENOUS

## 2023-09-23 SURGICAL SUPPLY — 80 items
BAG EQUIPMENT L28 IN X W36 IN BAND (Other) ×1 IMPLANT
BANDAGE COFLEX NL COMPRESSION L5 YD X W6 (Procedure Accessories) ×1
BANDAGE COFLEX NL COMPRESSION L5 YD X W6 IN COHESIVE SELF ADHERENT TAN (Procedure Accessories) ×1 IMPLANT
BANDAGE GAUZE L3.6 YD X W3.4 IN 6 PLY ABSORBENT STRETCH TIGHT FINISH (Bandage) ×1 IMPLANT
BANDAGE MEDLINE GAUZE L3.6 YD X W3.4 IN (Bandage) ×1
BIT DRILL L185 MM OD4.2 MM FREEHAND (Drillbits) ×2
BIT DRILL L185 MM OD4.2 MM NA FREEHAND (Drillbits) ×2 IMPLANT
COVER FLEXIBLE LIGHT HANDLE PLASTIC GREEN (Procedure Accessories) ×2 IMPLANT
COVER FLEXIBLE MEDLINE LIGHT HANDLE (Procedure Accessories) ×2
DRAPE EQP C-ARMOR STRL XPD CLPSBL CARM (Drape) ×1
DRAPE EXPAND COLLAPSIBLE EQUIPMENT C-ARMOR C ARM FLUOROSCOPE STERILE (Drape) ×1 IMPLANT
DRAPE SRG PE STRDRP 17X11IN LF STRL ADH (Drape)
DRAPE SURGICAL ADHESIVE L51 IN X W47 IN (Drape) ×2
DRAPE SURGICAL ADHESIVE L51 IN X W47 IN STERI-DRAPE CLEAR (Drape) ×2 IMPLANT
DRAPE SURGICAL ADHESIVE STRIP SMALL TOWEL MATTE FINISH L17 IN X W11 IN (Drape) IMPLANT
DRAPE SURGICAL IMPERVIOUS LARGE FILM L84 (Drape) ×1
DRAPE SURGICAL IMPERVIOUS LARGE FILM L84 IN X W60 IN SMS U (Drape) ×1 IMPLANT
DRAPE SURGICAL SHEET FANFOLD L98 IN X (Drape) ×2
DRAPE SURGICAL SHEET FANFOLD L98 IN X W77 IN TIBURON XL PINK (Drape) ×2 IMPLANT
DRAPE SURGICAL ULTRA LOW LINT PUNCTURE (Drape) ×3
DRAPE SURGICAL ULTRAGARD SPLIT L108 IN X W77 IN SMS (Drape) ×3 IMPLANT
DRESSING CURAD GAUZE MESH 9X5IN (Procedure Accessories) ×1
DRESSING PETROLATUM L9 IN X W5 IN NON ADHERENT OCCLUSIVE CURAD BISMUTH (Procedure Accessories) ×1 IMPLANT
DRESSING TRANSPARENT L4 3/4 IN X W4 IN (Dressing) ×4
DRESSING TRANSPARENT L4 3/4 IN X W4 IN POLYURETHANE ADHESIVE (Dressing) ×4 IMPLANT
DRILL SURGICAL L360 MM LOCK OD4.2 MM (Drillbits) ×1
DRILL SURGICAL L360 MM LOCK OD4.2 MM NA (Drillbits) ×1 IMPLANT
ELECTRODE ADULT PATIENT RETURN L9 FT REM POLYHESIVE ACRYLIC FOAM (Procedure Accessories) ×1 IMPLANT
ELECTRODE PATIENT RETURN L9 FT VALLEYLAB (Procedure Accessories) ×1
GLOVE SURGICAL 7 1/2 BIOGEL PI INDICATOR (Glove) ×1
GLOVE SURGICAL 7 1/2 BIOGEL PI INDICATOR UNDERGLOVE POWDER FREE SMOOTH (Glove) ×1 IMPLANT
GLOVE SURGICAL 7.5 BIOGEL PI MICRO (Glove) ×3
GLOVE SURGICAL 7.5 BIOGEL PI MICRO POWDER FREE ROUGH BEAD CUFF (Glove) ×3 IMPLANT
GRAFT BONE CANCELLOUS 40 ML PRESERVON (Bone) ×1 IMPLANT
GRAFT BONE CANCELLOUS 40 ML PRESERVON CHIP CRUSHED READIGRAFT 1-8 MM (Bone) ×1 IMPLANT
GRAFT BONE CELLULAR BONE MATRIX (Allograft) ×1 IMPLANT
GRAFT BONE CELLULAR BONE MATRIX ALLOGRAFT VIVIGEN FORMABLE MEDIUM (Allograft) ×1 IMPLANT
GUIDEWIRE ORTHOPEDIC T2 OD3 MM L1000 MM (Guide Wire) ×1
GUIDEWIRE ORTHOPEDIC T2 OD3 MM L1000 MM BALL TIP TITANIUM FEMORAL NAIL (Guide Wire) ×1 IMPLANT
HEAD REAMER OD11 MM RIA 2 (Reamer) ×1 IMPLANT
HEAD REAMER OD11 MM STERILE RIA 2 (Reamer) ×1
HEAD REAMER OD12 MM RIA 2 (Reamer) ×1 IMPLANT
HEAD REAMER OD14 MM RIA 2 (Reamer) ×1 IMPLANT
HEAD REAMER OD14 MM SYNTHES RIA 2 (Reamer) ×1
HEAD REAMER OD15 MM RIA 2 (Reamer) ×1 IMPLANT
HEAD REAMER OD15 MM SYNTHES RIA 2 (Reamer) ×1
HEAD REAMER OD16 MM RIA 2 (Reamer) ×1 IMPLANT
HEAD REAMER OD16 MM SYNTHES RIA 2 (Reamer) ×1
HEAD RMR 12MM STRL RIA 2 (Reamer) ×1
NAIL OD14 MM L380 MM FEMORAL T2 (Nail) ×1 IMPLANT
NAIL OD14 MM L380 MM FEMORAL T2 INTRAMEDULLARY RETROGRADE (Nail) ×1 IMPLANT
PACK SRG LF STRL XTRMT DISP ~~LOC~~ (Pack) ×1
POUCH 11X7IN 2 ADHSV STRIP 2 COMPARTMENT STERI-DRAPE INSTRUMENT PLSTC (Drape) ×1 IMPLANT
POUCH L11 IN X W7 IN 2 ADHESIVE STRIP 2 (Drape) ×1
REAMER MODIFY TRINKLE CLEARANCE HIGH (Reamer) ×1
REAMER MODIFY TRINKLE CLEARANCE HIGH RELIABILITY LOW FRICTION SURFACE (Reamer) ×1 IMPLANT
ROD REAMING L950 MM BALL TIP OD2.5 MM (Procedure Accessories) ×1 IMPLANT
SCREW L40 MM OD5 MM LOCK BONE T2 ALPHA (Screw) ×1 IMPLANT
SCREW L45 MM OD5 MM LOCK BONE T2 ALPHA (Screw) ×1 IMPLANT
SCREW L60 MM OD5 MM LOCK BONE T2 ALPHA (Screw) ×1 IMPLANT
SCREW L80 MM OD5 MM LOCK BONE T2 ALPHA (Screw) ×1 IMPLANT
SET REAMER L520 MM (Kits) ×1 IMPLANT
SET REAMER L520 MM SYNTHES (Kits) ×1
SOLUTION ANTISEPTIC RUB BOTTLE MEDLINE (Scrub Supplies) ×1 IMPLANT
SOLUTION SURGICAL PREP 26 ML DURAPREP (Prep) ×3
SOLUTION SURGICAL PREP 26 ML DURAPREP 74% ISOPROPYL ALCOHOL 0.7% (Prep) ×3 IMPLANT
SPONGE GAUZE L4 IN X W4 IN 12 PLY WOVEN (Dressing) ×2
SPONGE GAUZE L4 IN X W4 IN 12 PLY WOVEN FOLD EDGE XRAY DETECT COTTON (Dressing) ×2 IMPLANT
SPONGE LAPAROTOMY L18 IN X W18 IN 4 PLY (Sponge)
SPONGE LAPAROTOMY L18 IN X W18 IN 4 PLY RADIOPAQUE PYRONEMA FREE HIGH (Sponge) IMPLANT
STAPLER SKIN L4.1 MM X W6.5 MM 35 WIDE (Staplers) ×1
STAPLER SKIN L4.1 MM X W6.5 MM 35 WIDE STAPLE CARTRIDGE APPOSE ULC (Staplers) ×1 IMPLANT
STOCKINETTE COTTON L36 IN X W9 IN HEAVY (Drape) ×1
STOCKINETTE COTTON L36 IN X W9IN HEAVY WEIGHT IMPERVIOUS MEDLN ORTHPDC (Drape) ×1 IMPLANT
TIP SUCTION SELEC-TROL LARGE OD24 FR (Suction) ×1
TIP SUCTION SELEC-TROL LARGE OD24 FR VINYL (Suction) ×1 IMPLANT
TOWEL L27 IN X W17 IN COTTON PREWASH (Other) ×1
TOWEL L27 IN X W17 IN COTTON PREWASH DELINT HIGH ABSORBENT BLUE (Other) ×1 IMPLANT
TRAY SURGICAL EXTREMITY ~~LOC~~ (Pack) ×1 IMPLANT
WIRE FIXATION OD3 MM L285 MM KIRSCHNER (Instrument) ×1 IMPLANT

## 2023-09-23 NOTE — Brief Op Note (Signed)
 Date/Time: 2:44 PM, 09/23/2023    Patient Name:     Laura Mclaughlin     Date of Operation:     09/23/2023     Providers Performing:     Surgeons and Role:     Gregery Na, MD - Primary     * Jorja Loa, DPM - Resident - Assisting      Diagnosis
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
## 2023-09-23 NOTE — Anesthesia Preprocedure Evaluation (Signed)
 Anesthesia Evaluation    AIRWAY    Mallampati: II    TM distance: >3 FB  Neck ROM: full  Mouth Opening:full   CARDIOVASCULAR    cardiovascular exam normal       DENTAL    no notable dental hx               PULMONARY    pulmonary exam normal     OTHER FINDI

## 2023-09-23 NOTE — Nursing Progress Note (Signed)
 4 eyes in 4 hours pressure injury assessment note:      Completed with: Affie RN   Unit & Time admitted: 1736 nt8             Bony Prominences: Check appropriate box; if wound is present enter wound assessment in LDA     Occiput:                 [x] WNL  [] 

## 2023-09-23 NOTE — Brief Op Note (Signed)
 BRIEF OP NOTE    Date Time: 09/23/23 6:27 PM    Patient Name:   Laura Mclaughlin, Laura Mclaughlin (MRN: [id number])    Date of Operation:   09/23/2023     Providers Performing:   Surgeons and Role:     * Gregery Na, MD - Primary     * Jorja Loa, DPM - Resident -

## 2023-09-23 NOTE — H&P (Signed)
 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     Subjective: No interval change in history physical exam. Patient Denies nausea, vomiting, or fevers.     Physical Exam:  V

## 2023-09-23 NOTE — Plan of Care (Signed)
 Procedure(s):  LEFT REPAIR, NONUNION, FEMUR W/ GRAFT    1 Day Post-Op  -------------------         Pt A&O X4. VSS.  Tolerated whole pills with thin liquids.  Pt able to reposition in bed without assistance.  Encouraged IS use.   Continent of bowel and bl

## 2023-09-23 NOTE — Anesthesia Procedure Notes (Signed)
 Peripheral Nerve Block    Patient location during procedure: Pre-Op  Reason for block: Post-op pain management      Injection technique: Single Shot  Block Region: Fascia Iliaca  Laterality: Left      Block at surgeon's request: Yes      Start time: 11/18/

## 2023-09-23 NOTE — Progress Notes (Signed)
 Assisted anesthesiologist Dr. Abelina Bachelor with placing left fascia iliaca peripheral nerve block, single shot, intraop. Vital signs monitored and charted by CRNA. Sterile protocol maintained. Patient was previously sedated and intubated for procedure.  Expect

## 2023-09-23 NOTE — Transfer of Care (Signed)
 Anesthesia Transfer of Care Note    Patient: Laura Mclaughlin    Procedures performed: Procedure(s):  LEFT REPAIR, NONUNION, FEMUR W/ GRAFT    Anesthesia type: General ETT    Patient location:Phase I PACU    Last vitals:   Vitals:    09/23/23 1517   BP: 1

## 2023-09-23 NOTE — Op Note (Signed)
 FULL OPERATIVE NOTE    Date Time: 09/23/23 2:56 PM  Patient Name: Laura Mclaughlin, Laura Mclaughlin (MRN: [id number])  Attending Physician: Gregery Na, MD      Date of Operation:   09/23/2023    Providers Performing:   Surgeons and Role:     Gregery Na, MD - Primary

## 2023-09-24 DIAGNOSIS — S72402A Unspecified fracture of lower end of left femur, initial encounter for closed fracture: Secondary | ICD-10-CM

## 2023-09-24 LAB — BASIC METABOLIC PANEL
Anion Gap: 6 (ref 5.0–15.0)
BUN: 12 mg/dL (ref 7–21)
CO2: 22 meq/L (ref 17–29)
Calcium: 7.8 mg/dL — ABNORMAL LOW (ref 8.5–10.5)
Chloride: 113 meq/L — ABNORMAL HIGH (ref 99–111)
Creatinine: 0.9 mg/dL (ref 0.4–1.0)
GFR: >60 mL/min/{1.73_m2} (ref >=60.0)
Glucose: 109 mg/dL — ABNORMAL HIGH (ref 70–100)
Hemolysis Index: 48 {index}
Potassium: 4.2 meq/L (ref 3.5–5.3)
Sodium: 141 meq/L (ref 135–145)

## 2023-09-24 LAB — RED BLOOD CELLS OR HOLD
Expiration Date: 202412072359
Expiration Date: 202412082359
Expiration Date: 202412102359
Expiration Date: 202412102359
Expiration Date: 202412052359
Expiration Date: 202412062359
ISBT CODE: 600
ISBT CODE: 600
ISBT CODE: 600
ISBT CODE: 600
ISBT CODE: 600
ISBT CODE: 600

## 2023-09-24 LAB — CROSSMATCH PRBC, 1 UNIT
Expiration Date: 202412122359
ISBT CODE: 600
Product Status: TRANSFUSED

## 2023-09-24 LAB — CBC
Absolute nRBC: 0 10*3/uL (ref <=0.00)
Hematocrit: 20.3 % — ABNORMAL LOW (ref 34.7–43.7)
Hemoglobin: 6.4 g/dL — ABNORMAL LOW (ref 11.4–14.8)
MCH: 30 pg (ref 25.1–33.5)
MCHC: 31.5 g/dL (ref 31.5–35.8)
MCV: 95.3 fL (ref 78.0–96.0)
MPV: 10.5 fL (ref 8.9–12.5)
Platelet Count: 162 10*3/uL (ref 142–346)
RBC: 2.13 10*6/uL — ABNORMAL LOW (ref 3.90–5.10)
RDW: 14 % (ref 11–15)
WBC: 5.05 10*3/uL (ref 3.10–9.50)
nRBC %: 0 /100{WBCs} (ref <=0.0)

## 2023-09-24 LAB — TYPE AND SCREEN
ABO Rh: A NEG
Antibody Screen: POSITIVE

## 2023-09-24 LAB — ANTIBODY IDENTIFICATION,  REFLEX
Antibody ID: POSITIVE
Antibody ID: POSITIVE

## 2023-09-24 LAB — LAB USE ONLY - FUNGAL STAIN
Fungal Stain: NONE SEEN
Fungal Stain: NONE SEEN

## 2023-09-24 LAB — HEMOGLOBIN AND HEMATOCRIT
Hematocrit: 22.4 % — ABNORMAL LOW (ref 34.7–43.7)
Hemoglobin: 7.2 g/dL — ABNORMAL LOW (ref 11.4–14.8)

## 2023-09-24 MED ORDER — OXYCODONE HCL 5 MG PO TABS
5.0000 mg | ORAL_TABLET | ORAL | 0 refills | Status: DC | PRN
Start: 2023-09-24 — End: 2023-10-01

## 2023-09-24 MED ORDER — GABAPENTIN 300 MG PO CAPS
300.0000 mg | ORAL_CAPSULE | Freq: Three times a day (TID) | ORAL | 0 refills | Status: DC
Start: 2023-09-24 — End: 2023-09-25

## 2023-09-24 MED ORDER — SENNOSIDES-DOCUSATE SODIUM 8.6-50 MG PO TABS
2.0000 | ORAL_TABLET | Freq: Two times a day (BID) | ORAL | Status: DC
Start: 2023-09-24 — End: 2023-09-25

## 2023-09-24 MED ORDER — ASPIRIN 81 MG PO TBEC
81.0000 mg | DELAYED_RELEASE_TABLET | Freq: Two times a day (BID) | ORAL | 0 refills | Status: DC
Start: 2023-09-24 — End: 2023-09-25

## 2023-09-24 MED ORDER — ACETAMINOPHEN 500 MG PO TABS
1000.0000 mg | ORAL_TABLET | Freq: Three times a day (TID) | ORAL | Status: DC | PRN
Start: 2023-09-24 — End: 2023-09-25

## 2023-09-24 NOTE — PT Eval Note (Signed)
 Physical Therapy Evaluation  [name]      Post Acute Care Therapy Recommendations:     Discharge Recommendation: Home with supervision, Home with home health PT    D/C Milestones: bed mobility, transfer, gait training. Anticipate achievement in 3

## 2023-09-24 NOTE — Discharge Summary (Signed)
 Orthopaedic Discharge Summary    Orthopaedic assessment:  Laura Mclaughlin is a 62 y.o. female who is s/p Left femur nonunion repair and bone graft harvest from right femur on 11/18.    Weightbearing: Weightbearing as tolerated   Follow-up: Iva Lento

## 2023-09-24 NOTE — Plan of Care (Addendum)
 Procedure(s):  LEFT REPAIR, NONUNION, FEMUR W/ GRAFT    1 Day Post-Op  -------------------     Patient Lines/Drains/Airways Status       Active Lines, Drains and Airways       Name Placement date Placement time Site Days    Peripheral IV 09/23/23 18 G Ri

## 2023-09-24 NOTE — Progress Notes (Addendum)
 Orthopedic Trauma Daily Progress Note    09/24/2023 6:41 AM    Laura Asp Alyona Mclaughlin is a 62 y.o. 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     Subjective: Doing well. Pain 6/10. Controled overnight and re

## 2023-09-24 NOTE — PT Progress Note (Signed)
 [facility]   Physical Therapy Cancellation Note      Patient:  Laura Mclaughlin MRN#:  [id number]  Unit:  [facility] TOWER 8 Room/Bed:  F825/F825.01    09/24/2023  Time: 9:38 AM       PT Cancellation: Visit  PT Visit Cancellati

## 2023-09-24 NOTE — UM Notes (Signed)
 PATIENT NAME: Laura Mclaughlin,Laura Mclaughlin   DOB: 1961/08/22   PMH:  has a past medical history of Closed fracture of distal end of left femur, unspecified fracture morphology, initial encounter, Convulsions, Depression, Difficulty walking, Encounter for blood trans

## 2023-09-24 NOTE — Discharge Instr - AVS First Page (Addendum)
 [facility]  [address], [address]  [place], [place] [address]  Phone: [phone]  Fax [phone]    DISCHARGE INSTRUCTIONS:  Date of Admission: 09/23/2023   Admission Diagnosis: Closed fracture of distal end of left femu

## 2023-09-24 NOTE — OT Progress Note (Signed)
 Occupational Therapy Note    [facility]   Occupational Therapy Cancellation Note      Patient:  Laura Mclaughlin MRN#:  [id number]  Unit:  [facility] TOWER 8 Room/Bed:  F825/F825.01    09/24/2023  Time: 2:19 PM       OT Cancell

## 2023-09-24 NOTE — Progress Notes (Signed)
 Therapy is pending. Patient would like to go to [facility] when medically ready for discharge.    CM will continue to follow for discharge needs/planning.     09/24/23 0829   Patient Type   Within 30 Days of Previous Admission? No   Healthcare Nicole Cella

## 2023-09-24 NOTE — OT Eval Note (Signed)
 Occupational Therapy Eval [name]        Post Acute Care Therapy Recommendations:     Discharge Recommendations:  Home with supervision, Home with home health OT    D/C Milestones:  Anticipate achievement with increased indep with ADLs and functi

## 2023-09-25 ENCOUNTER — Ambulatory Visit (INDEPENDENT_AMBULATORY_CARE_PROVIDER_SITE_OTHER): Payer: Self-pay

## 2023-09-25 ENCOUNTER — Other Ambulatory Visit: Payer: Self-pay | Admitting: Physician Assistant

## 2023-09-25 LAB — CBC
Absolute nRBC: 0 10*3/uL (ref <=0.00)
Hematocrit: 24.1 % — ABNORMAL LOW (ref 34.7–43.7)
Hemoglobin: 7.6 g/dL — ABNORMAL LOW (ref 11.4–14.8)
MCH: 29.9 pg (ref 25.1–33.5)
MCHC: 31.5 g/dL (ref 31.5–35.8)
MCV: 94.9 fL (ref 78.0–96.0)
MPV: 10.6 fL (ref 8.9–12.5)
Platelet Count: 168 10*3/uL (ref 142–346)
RBC: 2.54 10*6/uL — ABNORMAL LOW (ref 3.90–5.10)
RDW: 14 % (ref 11–15)
WBC: 5.38 10*3/uL (ref 3.10–9.50)
nRBC %: 0 /100{WBCs} (ref <=0.0)

## 2023-09-25 LAB — BASIC METABOLIC PANEL
Anion Gap: 7 (ref 5.0–15.0)
BUN: 9 mg/dL (ref 7–21)
CO2: 23 meq/L (ref 17–29)
Calcium: 8 mg/dL — ABNORMAL LOW (ref 8.5–10.5)
Chloride: 110 meq/L (ref 99–111)
Creatinine: 1 mg/dL (ref 0.4–1.0)
GFR: >60 mL/min/{1.73_m2} (ref >=60.0)
Glucose: 108 mg/dL — ABNORMAL HIGH (ref 70–100)
Hemolysis Index: 6 {index}
Potassium: 4 meq/L (ref 3.5–5.3)
Sodium: 140 meq/L (ref 135–145)

## 2023-09-25 MED ORDER — OXYCODONE HCL 5 MG PO TABS
5.0000 mg | ORAL_TABLET | ORAL | 0 refills | Status: DC | PRN
Start: 2023-09-25 — End: 2023-09-25

## 2023-09-25 MED ORDER — SENNOSIDES-DOCUSATE SODIUM 8.6-50 MG PO TABS
2.0000 | ORAL_TABLET | Freq: Two times a day (BID) | ORAL | 0 refills | Status: DC | PRN
Start: 2023-09-25 — End: 2023-09-25

## 2023-09-25 MED ORDER — GABAPENTIN 300 MG PO CAPS
300.0000 mg | ORAL_CAPSULE | Freq: Three times a day (TID) | ORAL | 0 refills | Status: DC
Start: 2023-09-25 — End: 2023-09-25

## 2023-09-25 MED ORDER — ASPIRIN 81 MG PO TBEC
81.0000 mg | DELAYED_RELEASE_TABLET | Freq: Two times a day (BID) | ORAL | 0 refills | Status: DC
Start: 2023-09-25 — End: 2023-09-25

## 2023-09-25 MED ORDER — ACETAMINOPHEN 500 MG PO TABS
1000.0000 mg | ORAL_TABLET | Freq: Three times a day (TID) | ORAL | 0 refills | Status: AC
Start: 2023-09-25 — End: 2024-08-28

## 2023-09-25 NOTE — Consults (Signed)
Consult noted  Pt d/c home today with [facility] services      Marium Ragan Johnson-Addo, MSW  SW Case Manager  Case Manager Department  [phone]

## 2023-09-25 NOTE — Nursing Progress Note (Signed)
B/P at last vital signs at 145/73

## 2023-09-25 NOTE — Progress Notes (Signed)
 D/c home with [facility] services  RN [facility] following  Pt/family to provide transport  Bedside nurse aware       09/25/23 0956   Discharge Disposition   Patient preference/choice provided? Yes   Physical Discharge Disposition Home, Home Health   Receiving facility

## 2023-09-25 NOTE — Plan of Care (Addendum)
 Procedure(s):  LEFT REPAIR, NONUNION, FEMUR W/ GRAFT    2 Days Post-Op  -------------------     Patient Lines/Drains/Airways Status       Active Lines, Drains and Airways       Name Placement date Placement time Site Days    Peripheral IV 09/23/23 18 G R

## 2023-09-25 NOTE — OT Progress Note (Signed)
 Occupational Therapy Treatment  Laura Mclaughlin        Post Acute Care Therapy Recommendations:     Discharge Recommendations:  Acute Rehab    If Acute Rehab  recommended discharge disposition is not available, patient will need 2 person hands on assist

## 2023-09-25 NOTE — Plan of Care (Signed)
 Laura Mclaughlin is a 62 y.o. female who presents to the hospital on 09/23/2023     Procedure(s):  LEFT REPAIR, NONUNION, FEMUR W/ GRAFT    2 Days Post-Op  -------------------     Received pt at start of shift awake and alert, oriented x 4.  Pain to left

## 2023-09-25 NOTE — PT Progress Note (Signed)
 Physical Therapy Treatment  Laura Mclaughlin  Post Acute Care Therapy Recommendations:     Discharge Recommendations:  Acute Rehab    If Acute Rehab  recommended discharge disposition is not available, patient will need transport into the home, 1-2 person

## 2023-09-25 NOTE — Progress Notes (Addendum)
 Orthopedic Trauma Daily Progress Note    09/25/2023 7:17 AM    Laura Asp [name] is a 62 y.o. 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     Subjective: Doing well. Pain well controled. Patient Denies n

## 2023-09-26 ENCOUNTER — Encounter: Payer: Self-pay | Admitting: Student in an Organized Health Care Education/Training Program

## 2023-09-26 DIAGNOSIS — M80052K Age-related osteoporosis with current pathological fracture, left femur, subsequent encounter for fracture with nonunion: Secondary | ICD-10-CM

## 2023-09-26 LAB — CBC
Absolute nRBC: 0 10*3/uL (ref <=0.00)
Hematocrit: 25.3 % — ABNORMAL LOW (ref 34.7–43.7)
Hemoglobin: 7.9 g/dL — ABNORMAL LOW (ref 11.4–14.8)
MCH: 29.5 pg (ref 25.1–33.5)
MCHC: 31.2 g/dL — ABNORMAL LOW (ref 31.5–35.8)
MCV: 94.4 fL (ref 78.0–96.0)
MPV: 10.4 fL (ref 8.9–12.5)
Platelet Count: 197 10*3/uL (ref 142–346)
RBC: 2.68 10*6/uL — ABNORMAL LOW (ref 3.90–5.10)
RDW: 14 % (ref 11–15)
WBC: 6.69 10*3/uL (ref 3.10–9.50)
nRBC %: 0 /100{WBCs} (ref <=0.0)

## 2023-09-26 LAB — BASIC METABOLIC PANEL
Anion Gap: 10 (ref 5.0–15.0)
BUN: 11 mg/dL (ref 7–21)
CO2: 20 meq/L (ref 17–29)
Calcium: 8 mg/dL — ABNORMAL LOW (ref 8.5–10.5)
Chloride: 109 meq/L (ref 99–111)
Creatinine: 0.9 mg/dL (ref 0.4–1.0)
GFR: >60 mL/min/{1.73_m2} (ref >=60.0)
Glucose: 97 mg/dL (ref 70–100)
Hemolysis Index: 17 {index}
Potassium: 4.1 meq/L (ref 3.5–5.3)
Sodium: 139 meq/L (ref 135–145)

## 2023-09-26 MED ORDER — BISACODYL 10 MG RE SUPP
10.0000 mg | Freq: Once | RECTAL | Status: AC
Start: 2023-09-26 — End: 2023-09-26
  Administered 2023-09-26: 10 mg via RECTAL
  Filled 2023-09-26: qty 1

## 2023-09-26 MED ORDER — ENEMA 7-19 GM/118ML RE ENEM
1.0000 | ENEMA | Freq: Once | RECTAL | Status: DC
Start: 2023-09-26 — End: 2023-10-05

## 2023-09-26 NOTE — Plan of Care (Signed)
 Procedure(s):  LEFT REPAIR, NONUNION, FEMUR W/ GRAFT    3 Days Post-Op  -------------------     Assessment  Neuro: Patient is alert and oriented x4. VSS.  Tolerated whole pills with thin liquids.  Neurovascular: Pt pulses strong bilaterally.  Mobility: P

## 2023-09-26 NOTE — OT Progress Note (Signed)
 Occupational Therapy Treatment  Laura Mclaughlin        Post Acute Care Therapy Recommendations:     Discharge Recommendations:  Acute Rehab    If Acute Rehab  recommended discharge disposition is not available, patient will need 2 person hands on assist

## 2023-09-26 NOTE — Plan of Care (Addendum)
 Progress note:   Assumed care of patient. Pt A&O X4. VSS. No SOB  Tolerated pills whole with thin liquids  surgical dressings are clean, dry, and intact.  Pt able to ambulate with the aide of walker x2 person assist as well as reposition self in bed withou

## 2023-09-26 NOTE — PT Progress Note (Signed)
 Physical Therapy Treatment  Laura Mclaughlin  Post Acute Care Therapy Recommendations:     Discharge Recommendations:  Acute Rehab    If Acute Rehab  recommended discharge disposition is not available, patient will need transport into the home, 1-2 person

## 2023-09-26 NOTE — Plan of Care (Signed)
 Laura Mclaughlin is a 62 y.o. female who presents to the hospital on 09/23/2023       Procedure(s):  LEFT REPAIR, NONUNION, FEMUR W/ GRAFT    3 Days Post-Op  -------------------           Most Recent Set of Vitals   Visit Vitals  BP 94/59   Pulse 86   Te

## 2023-09-26 NOTE — Progress Notes (Signed)
 [facility] Note:  Referral received. Pt has been identified as a potential candidate for acute rehab. Will evaluate, interview, and identify a safe discharge plan if appropriate.     Yes, willing to accept patient pending rehab physician approva

## 2023-09-26 NOTE — Progress Notes (Addendum)
 Orthopedic Trauma Daily Progress Note    09/26/2023 7:37 AM    Laura Mclaughlin is a 62 y.o. 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     Subjective: Doing well. Pain well controled. Patient Denies n

## 2023-09-27 LAB — ANTIBODY DIFF. XM MD REVIEW CPT 86077, REFLEX

## 2023-09-27 LAB — LAB USE ONLY - CULTURE ANAEROBIC BACTERIA, WOUND/TISSUE/FLUID
Culture Anaerobic: NO GROWTH
Culture Anaerobic: NO GROWTH

## 2023-09-27 MED ORDER — ROTIGOTINE 1 MG/24HR TD PT24
1.0000 | MEDICATED_PATCH | TRANSDERMAL | Status: DC
Start: 2023-09-27 — End: 2023-10-05
  Administered 2023-09-27 – 2023-10-04 (×7): 1 via TRANSDERMAL
  Filled 2023-09-27: qty 1

## 2023-09-27 NOTE — Plan of Care (Signed)
 Procedure(s):  LEFT REPAIR, NONUNION, FEMUR W/ GRAFT    4 Days Post-Op  -------------------     Most Recent Set of Vitals   Visit Vitals  BP 101/62   Pulse 90   Temp 98.3 F (36.8 C) (Oral)   Resp 18   Ht 1.753 m (5' 9.02")   Wt 108.4 kg (238 lb 15.7 oz

## 2023-09-27 NOTE — Discharge Summary (Signed)
Orthopaedic Discharge Summary    Orthopaedic assessment:  Laura Mclaughlin is a 62 y.o. female who is s/p Left femur nonunion repair and bone graft harvest from right femur on 11/18.    Weightbearing: Weightbearing as tolerated   Follow-up: Gregery Na, MD in 2 weeks    Elayne Snare, MD  Department of Orthopaedics  Ortho On-Call [facility]  [phone]  Ortho On-Call Pager  [phone]        HPI    Laura Mclaughlin is a 62 y.o. year old female. 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 The patient was electively admitted to the Orthopaedic service.   She was taken to the operating room for Procedure(s) (LRB):  LEFT REPAIR, NONUNION, FEMUR W/ GRAFT (Left) with Dr. Gregery Na, MD. Perioperative antibiotics were administered, the patient tolerated the procedure well and was transfered to the PACU in stable condition. Postoperatively the patient was made weight bearing as tolerated Right Lower Extremity  Left Lower Extremity. The patient worked well with physical therapy and was recommended for discharge to acute rehab. The patient's pain was well controlled throughout the entire admission.  The patient is now in good condition..      Past Medical and Surgical History   Medical History[1]   Past Surgical History[2]  Past Social History   Social  History[3]  Family History   Family History[4]  Reviewed and not contributory to this presentation.    Review of Systems:   All other systems were reviewed and are negative except: musculoskeletal, as noted above            Medications upon discharge      Medication List      START taking these medications     acetaminophen 500 MG tablet; Commonly known as: TYLENOL; Take 2 tablets   (1,000 mg) by mouth every 8 (eight) hours for 14 days   aspirin EC 81 MG EC tablet; Take 1 tablet (81 mg) by mouth every 12   (twelve) hours   oxyCODONE 5 MG immediate release tablet; Commonly known as: ROXICODONE;   Take 1 tablet (5 mg) by mouth every 4 (four) hours as needed for Pain   senna-docusate 8.6-50 MG per tablet; Commonly known as: PERICOLACE; Take   2 tablets by mouth 2 (two) times daily as needed for Constipation     CONTINUE taking these medications     cloBAZam 20 MG Tabs   Cyanocobalamin 1000 MCG/ML Kit   Desvenlafaxine ER 100 MG Tb24   gabapentin 300 MG capsule; Commonly known as: NEURONTIN; Take 1 capsule   (300 mg) by mouth every 8 (eight) hours for 14 days   lamoTRIgine 100 MG tablet; Commonly known as: LaMICtal   levothyroxine 75 MCG tablet; Commonly known as: SYNTHROID   LORazepam 0.5 MG tablet; Commonly known as: ATIVAN   Methocarbamol 1000 MG Tabs; Take 1 tablet (1,000 mg) by mouth 4 (four)   times daily   multivitamin Liqd   naloxone  4 MG/0.1ML nasal spray; Commonly known as: NARCAN; 1 spray   intranasally. If pt does not respond or relapses into respiratory   depression call 911. Give additional doses every 2-3 min.   ondansetron 4 MG disintegrating tablet; Commonly known as: ZOFRAN-ODT   pantoprazole 40 MG tablet; Commonly known as: PROTONIX   Rotigotine 3 MG/24HR Pt24; Place 1 patch (3 mg) onto the skin every 24   hours   topiramate 50 MG tablet; Commonly known as: TOPAMAX   traZODone 300 MG tablet; Commonly known as: DESYREL   vitamin D (ergocalciferol) 50000 UNIT Caps; Commonly known as: DRISDOL        Allergies   Reglan [Metoclopramide]  Blood-Group Specific Substance    BMI       Patient has BMI=Body mass index is 35.27 kg/m.  Diagnosis: Overweight based on BMI criteria       Hemodynamics  Lab Results   Component Value Date    HGB 7.9 (L) 09/26/2023    HGB 7.6 (L) 09/25/2023    HGB 7.2 (L) 09/24/2023    HGB 6.4 (L) 09/24/2023    HGB 11.4 09/20/2023      Lab Results   Component Value Date    HCT 25.3 (L) 09/26/2023    HCT 24.1 (L) 09/25/2023    HCT 22.4 (L) 09/24/2023    HCT 20.3 (L) 09/24/2023    HCT 35.4 09/20/2023     During the current admission, the patient demonstrated evidence of a clinically significant drop (>2.0 in hemoglobin and/or >6 hematocrit). Serial hematocrit and hemoglobin levels were monitored and treated appropriately (please see daily progress notes for interventions), involving co-managing teams in treatment plan.                                                         SURGERIES    Procedure(s):  LEFT REPAIR, NONUNION, FEMUR W/ GRAFT    12 Days Post-Op  -------------------                                                 INPATIENT IMAGING  No results found.                                             PLAN  Activity:              Mobility: Activity as tolerated    Further surgical plans: No further Orthopaedic plans     RUE: WBAT   LUE:  WBAT   RLE:  WBAT   LLE:  WBAT   Diet: Adult diet Regular    Wound Care:  - Discussed that if the patient has increased drainage, incision redness, foul smell, or fevers to immediately inform the office or proceed to the Emergency Department.  - Soft Dressings (no splint or cast): patient instructed that they may shower 3 days after surgery (no baths or submerging incision). Also instructed to not scrub incision and to pat the incision dry with a clean towel.  Patients may change dressings as needed with clean gauze and  tape or ACE bandages, unless otherwise instructed.      Medications:   See above for updated medication list.  - Tylenol: Patient  instructed that tylenol is available and can be obtained over-the-counter. Instructed to limit dosage to no more than 3000 mg in a 24 hr period. Patients with liver problems advised to refrain from taking until discussed with primary care provider. For children, recommend weight based dosing as directed on the medication packaging.  Prior to adding Tylenol, patient instructed to verify that pain medication does not have any Tylenol in it.   - Gabapentin: Patient instructed to take as instructed, usually three times per day. Also educated patient that Gabapentin only works when taken as scheduled, not as needed. Discussed that Gabapentin should not be stopped abruptly as it may cause withdrawal, and reviewed weaning schedule, stopping the mid-day dose for 3 days, then stopping the Morning dose for 3 days, and then stopping the final dose.  - Narcotics: (Oxycodone/Dilaudid/Hydrocodone/Tramadol/etc.) Discussed that this class of medications are a prescribed opioid medication and should only be taken as instructed. For mild pain, recommend taking Tylenol/Gabapentin/Ibuprofen. For moderate/severe pain recommend combining the prescribed opioid dose along with the Tylenol/Gabapentin/Ibuprofen.  (If prescribed a dose range, recommend taking the lowest dose for moderate pain and the higher dose for severe pain). Patient instructed that if he/she experiences shortness of breath, confusion, drowsiness, or becomes unresponsive, that he/she or someone in their home should administer Narcan(Naloxone- Opioid overdose reversal medication) and/or call 911 immediately.  Discussed that [place] narcotic law mandates that concurrent prescription of Narcan. Instructed patient to aggressively wean off of narcotic medications first. Patients also instructed that they must avoid operating a motor vehicle while taking narcotics.  - Bowel Regimen: Instructed patient to take stool softener (ex. Colace) as long as he/she is taking narcotic  pain medicines. If patient is unable to have a bowel movement every 2-3 days, the patient should proceed with over the counter laxatives (Senokot -S/Miralax), enemas, or suppositories for constipation relief.  - DVT Prophylaxis: Discussed that the patient may have a blood thinner (Lovenox or Aspirin) prescribed (see medication reconciliation) Educated patient that usually, DVT prophylaxis medications are prescribed and recommended for time periods based on the injury type and procedure performed. Discussed that if DVT prescription is not filled, that they are at increased risk of having a VTE.   - NSAIDs: (Motrin, Advil, Ibuprofen, Naprosyn, Aleve) Instructed to take as prescribed, or to obtain over-the-counter. Also recommend that patients check with his/her surgeon to verify that this class is OK to be part of pain regimen. For adults, recommended Ibuprofen 800mg  every 8 hours as needed for the first week, then to PRN. Discussed that if patient is or becomes pregnant, to avoid this class of medications. For children, recommended Motrin dosed per the child's weight as instructed on the packaging. Recommend that patients with history of kidney problem do not take these medications.    Follow-up: Gregery Na, MD approximately 2 weeks from the date of your surgery  - Typically, will plan for staple/suture removal at first orthopaedic follow-up appointment.  - she was instructed that x-rays will be obtained at first postoperative appointment, if indicated.    X-Rays (Radiology Studies):   - Patient instructed that if their x-rays (or MRI or CT) are to be performed anywhere other than our office, that he/she must make sure that they bring the actual images to the office visit.  A radiology report is not adequate; the surgeon must  visualize the actual images to accurately determine the plan of care.   [facility]ik[facility] Spray Patients: All outpatient radiology studies (X-ray, CT, MRI, etc) and physical therapy will need to be obtained  at a [facility]. Recommended taking prescriptions which are received as inpatient to their primary physician, so that they can enter them as a referral in the [facility] system. Radiology reports are not sufficient.  - [facility] Patients: If an upcoming surgery is planned, discussed that he/she must go to your primary care physician and get a referral for the surgery, and recommended that he/she start this process ASAP, as it takes up to a week to accomplish.      Physical/Occupational Therapy:  - Discussed that physical or occupational therapy may be prescribed for rehabilitation.The [facility] are all part of the same healthcare system as [facility] and our office. These centers accept most health insurance plans, or will participate in the financial arrangement (charity/discounted payment plan) that was put into effect when she presented to [facility].                   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    Depression     Difficulty walking     Encounter for blood transfusion     06/09/2022    History of blood transfusion 09/19/2023    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; last 09/2022; no hx of transfusion rxn       Hypothyroidism     Liver laceration, major, initial encounter 06/09/2022    Lupus     Neuropathy     PONV (postoperative nausea and vomiting) 09/19/2023    Red blood cell antibody positive 09/19/2023    Restless leg syndrome     Trauma shock, initial encounter 06/11/2022   [2]   Past Surgical History:  Procedure Laterality Date    APPLICATION, EXTERNAL FIXATOR, UPPER EXTREMITY Right 06/14/2022    Procedure: APPLICATION, EXTERNAL FIXATOR, UPPER EXTREMITY;  Surgeon: Timoteo Ace, MD;  Location: [facility];  Service: Orthopedics;   Laterality: Right;    CHOLECYSTECTOMY      GASTRIC BYPASS N/A     HERNIA REPAIR      x3    OPEN TREATMENT, FEMORAL FRACTURE, DISTAL, MEDIAL OR LATERAL CONDYLE, INTERNAL FIXATION Left 06/11/2022    Procedure: ORIF, FEMUR, DISTAL;  Surgeon: Lucrezia Europe, MD;  Location: [facility] OR;  Service: Orthopedics;  Laterality: Left;    ORIF, RADIUS, DISTAL (WRIST) Right 06/26/2022    Procedure: ORIF, RIGHT RADIUS, DISTAL (WRIST);  Surgeon: Amado Nash, MD;  Location: [facility];  Service: Orthopedics;  Laterality: Right;    ORIF, TIBIA PLATEAU Right 06/14/2022    Procedure: ORIF, TIBIA PLATEAU RIGHT;  Surgeon: Timoteo Ace, MD;  Location: [facility] T[facility] OR;  Service: Orthopedics;  Laterality: Right;    REMOVAL, HARDWARE, COMPLEX, NON-SPINE Left 09/05/2022    Procedure: LEFT LOWER EXTREMITY HARDWARE REMOVAL;  Surgeon: Gregery Na, MD;  Location:  TOWER OR;  Service: Orthopedics;  Laterality: Left;    REMOVAL, HARDWARE, COMPLEX, NON-SPINE Left 01/25/2023    Procedure: LEFT FEMUR REMOVAL OF HARDWARE;  Surgeon: Gregery Na, MD;  Location: [facility] MAIN  OR;  Service: Orthopedics;  Laterality: Left;    REMOVE, MODIFY, UPPER EXTREMITY EXTERNAL FIXATOR Right 06/26/2022    Procedure: REMOVAL EXTERNAL FIXATOR;  Surgeon: Amado Nash, MD;  Location: [facility] OR;  Service: Orthopedics;  Laterality: Right;    REPAIR, NONUNION, FEMUR W/ GRAFT Left 09/23/2023    Procedure: LEFT REPAIR, NONUNION, FEMUR W/ GRAFT;  Surgeon: Gregery Na, MD;  Location: [facility] T[facility] OR;  Service: Orthopedics;  Laterality: Left;    REPAIR, NONUNION, FEMUR, NO GRAFT Left 09/05/2022    Procedure: LEFT DISTAL FEMUR NONUNION REPAIR WITH IM NAIL;  Surgeon: Gregery Na, MD;  Location:  TOWER OR;  Service: Orthopedics;  Laterality: Left;    WISDOM TOOTH EXTRACTION     [3]   Social History  Tobacco Use    Smoking status: Never    Smokeless tobacco: Never   Vaping Use    Vaping status: Never Used   Substance Use Topics     Alcohol use: Never    Drug use: Never   [4]   Family History  Problem Relation Name Age of Onset    Hypertension Mother      Diabetes Mother

## 2023-09-27 NOTE — Progress Notes (Signed)
 CASE MANAGEMENT PROGRESS NOTE     DX: Closed fracture of left distal femur     LOS: 4 days     PLAN OF CARE :    DCP: Auth stated at [facility] 09/26/2023            Lanny Hurst Bynum,MSW  Social Worker Case Manager 1  [facility]   [phone]

## 2023-09-27 NOTE — Progress Notes (Deleted)
 Case Management Discharge Note     Patient is medically cleared to discharge to [facility] Rm 125 report [phone]. H&M transport scheduled for 5pm     Dispo: Acute Rehab         09/27/23 1514   Discharge Disposition   Patient preference/choice

## 2023-09-27 NOTE — Progress Notes (Signed)
 Orthopedic Trauma Daily Progress Note    09/27/2023 5:57 AM    Laura Asp Charmain Mclaughlin is a 62 y.o. 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     Subjective: Doing well. Pain well controled. Patient Denies n

## 2023-09-27 NOTE — OT Progress Note (Signed)
 Occupational Therapy Treatment  [name]        Post Acute Care Therapy Recommendations:     Discharge Recommendations:  Acute Rehab    If Acute Rehab  recommended discharge disposition is not available, patient will need 1-2 person hands on assis

## 2023-09-27 NOTE — Plan of Care (Signed)
 Progress note:   Assumed care of patient. Pt A&O X4. VSS. No SOB  Tolerated pills whole with thin liquids  surgical dressings are clean, dry, and intact.  Pt able to ambulate with the aide of walker x2 person assist as well as reposition self in bed with m

## 2023-09-27 NOTE — PT Progress Note (Signed)
 Physical Therapy Treatment  Laura Mclaughlin  Post Acute Care Therapy Recommendations:     Discharge Recommendations:  Acute Rehab    If Acute Rehab  recommended discharge disposition is not available, patient will need transport into the home, 1-2 person

## 2023-09-28 NOTE — Consults (Signed)
CM noted consult.    Currently [facility] waiting for an auth.        Harlon Ditty BSN, [facility]    [address] Case management   [facility]   [address]  [place] [address]    [email or web address]. [phone]

## 2023-09-28 NOTE — Plan of Care (Signed)
 Laura Mclaughlin is a 62 y.o. female who presents to the hospital on 09/23/2023       Procedure(s):  LEFT REPAIR, NONUNION, FEMUR W/ GRAFT    5 Days Post-Op  -------------------           Most Recent Set of Vitals   Visit Vitals  BP 99/63   Pulse 97   Te

## 2023-09-28 NOTE — Plan of Care (Signed)
 Progress note:   Assumed care of patient. Pt A&O X4. VSS. No SOB  Tolerated pills whole with thin liquids  surgical dressings are clean, dry, and intact.  Pt able to ambulate with the aide of walker x2 person assist as well as reposition self in bed with m

## 2023-09-28 NOTE — PT Progress Note (Signed)
 Physical Therapy Treatment  Laura Mclaughlin  Post Acute Care Therapy Recommendations:     Discharge Recommendations:  Acute Rehab    If Acute Rehab  recommended discharge disposition is not available, patient will need hands on assist for mobility, trans

## 2023-09-28 NOTE — OT Progress Note (Signed)
 Occupational Therapy Treatment  Laura Mclaughlin        Post Acute Care Therapy Recommendations:     Discharge Recommendations:  Acute Rehab    If Acute Rehab  recommended discharge disposition is not available, patient will need 1-2 person hands on assis

## 2023-09-28 NOTE — Progress Notes (Signed)
 4 eyes in 4 hours pressure injury assessment note:      RN or CNA Completed with: Ariana           Bony Prominences: Check appropriate box below     If wound is present add wound to LDA avatar      Occiput:   [x]  WNL   []  Wound present    Face:

## 2023-09-28 NOTE — Progress Notes (Addendum)
 Orthopedic Trauma Daily Progress Note    09/28/2023 6:43 AM    Laura Asp Natsha Mclaughlin is a 62 y.o. 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     Subjective: Doing well. Pain well controled. Patient Denies n

## 2023-09-28 NOTE — Progress Notes (Signed)
 Case management progress note    CM called [facility] to check the status and stated still pending.    Dispo: [facility]  Pending an auth.        Harlon Ditty BSN, RNCM    [address] Case management   [facility]   [address],  [address]

## 2023-09-29 NOTE — Plan of Care (Signed)
 Problem: Pain interferes with ability to perform ADL  Goal: Pain at adequate level as identified by patient  Outcome: Progressing     Problem: Side Effects from Pain Analgesia  Goal: Patient will experience minimal side effects of analgesic therapy  Outc

## 2023-09-29 NOTE — Consults (Signed)
WEEKEND PACC:  D/C PLANNING    Auth started for placement at [facility] arrangement is not a consideration at this time but please re-consult with [facility] referral order at later time if any Home Health needs

## 2023-09-29 NOTE — Progress Notes (Addendum)
 Orthopedic Trauma Daily Progress Note    09/29/2023 6:14 AM    Laura Mclaughlin is a 62 y.o. 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     Subjective: Doing well. Pain well controled. Patient Denies n

## 2023-09-29 NOTE — Progress Notes (Signed)
 Pt was due for rotigotine patch but RN  Idil Maslanka could not find it on patient bin. Pt called pharmacy but the Pharmacist told that the med's is not available over there as this is a patient supplied medication. Patient told RN that they gave 10 patch to RN of

## 2023-09-29 NOTE — Plan of Care (Signed)
 Patient is alert and oriented x4  VSS, On Room air  Tolerating regular diet- nauseous once- managed with PO zofran  Surgical site with 3 stiches on L thigh and 1 stiches on R thigh  Pain managed with PRN oxycodone.  No BM during the shift, had Adequate

## 2023-09-30 ENCOUNTER — Ambulatory Visit (INDEPENDENT_AMBULATORY_CARE_PROVIDER_SITE_OTHER): Payer: Self-pay

## 2023-09-30 LAB — CULTURE AND GRAM STAIN, AEROBIC BACTERIA, WOUND/TISSUE/FLUID
Culture Aerobic Bacteria: NO GROWTH
Culture Aerobic Bacteria: NO GROWTH
Gram Stain: NONE SEEN
Gram Stain: NONE SEEN
Gram Stain: NONE SEEN
Gram Stain: NONE SEEN

## 2023-09-30 NOTE — Progress Notes (Signed)
Still awaiting authorization from the insurance company

## 2023-09-30 NOTE — Progress Notes (Addendum)
Orthopedic Trauma Daily Progress Note    09/30/2023 6:19 AM    Laura Mclaughlin is a 62 y.o. 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     Subjective: Doing well. Pain well controled. Patient Denies nausea, vomiting, or fevers.      Cx: no growth (final)    Physical Exam:  Vitals:    09/30/23 0403   BP: 103/67   Pulse: 70   Resp: 16   Temp: 97.5 F (36.4 C)   SpO2: 95%        Intake/Output Summary (Last 24 hours) at 09/30/2023 5573  Last data filed at 09/30/2023 0400  Gross per 24 hour   Intake 200 ml   Output 2200 ml   Net -2000 ml     Lab Results   Component Value Date    INR 1.1 09/23/2023       bilateral lower extremity:  Incisions well coapted with no sign of infection  +TA,GS  +EHL,FHL  Sensation intact light touch plantar foot (absent dorsal sensation at baseline 2/2 neuropathy)  Cap refill < 2 seconds  Pedal pulses palpable  No pain with passive ROM digits    Lab Results   Component Value Date    HGB 7.9 (L) 09/26/2023    HGB 7.6 (L) 09/25/2023    HGB 7.2 (L) 09/24/2023      Lab Results   Component Value Date    HCT 25.3 (L) 09/26/2023    HCT 24.1 (L) 09/25/2023    HCT 22.4 (L) 09/24/2023    Review of pertinent labs demonstrate:   - Hemodynamic stability with no significant changes.                Assessment: 62 y.o. 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     Plan:   - Mobility: Out of bed as tolerated with PT/OT   - Pain control: Continue to wean/titrate to appropriate oral regimen   - DVT Prophylaxis (per Ortho Trauma service Protocol): Ortho Trauma service primary patient: - ASA 81mg  BID for 3 weeks post-op (fractures from femoral shaft to foot, BMI < 40)   - Foley catheter status: no foley   - Further surgical plans: No further Orthopaedic plans    - RUE: WBAT   - LUE:  WBAT   - RLE:  WBAT   - LLE:  WBAT   - No transfusion indicated, but will continue to follow serial labs and titrate IV fluids & volume  expanders as indicated to optimize hemodynamics/volume status.   - Disposition: discharge today to [facility]    Hozaifa Lucianne Lei, DPM    Attending Addendum/Attestation:    I have personally seen and examined this patient and have participated in their care. I have read and agree with the clinical information, including the physical exam and patient history as documented by the Resident.  The above note has been edited to reflect my corrections and findings, incorporate supplementary history and exam, and edit the Assessment and Plan as needed.    Agree with above.  They are planning and insurance authorization.  Doing better clinically day by day.    Ala Bent, MD  Orthopaedic Trauma  Office [phone]

## 2023-09-30 NOTE — OT Progress Note (Signed)
Occupational Therapy Reassessment Laura Mclaughlin        Post Acute Care Therapy Recommendations:     Discharge Recommendations:  Acute Rehab    If Acute Rehab  recommended discharge disposition is not available, patient will need hands on assist for ADL's, functional mobility/transfers, and HHOT.     DME needs IF patient is discharging home: Front wheel walker, Wheelchair-manual, [facility]    Therapy discharge recommendations may change with patient status.  Please refer to most recent note for up-to-date recommendations.    Patient anticipated to benefit from and to be able to engage in 3 hours of therapy a day for 5 days a week.       Assessment:   Significant Findings: Patient w/symptomatic orthostatic hypotension s/p several sit<>stand and stand pivot t/f, may be a pain response.    Pt received in bed and was agreeable to OT. Spouse "Loraine Leriche" present. Pt requires minA supine>sit with HOB 30 degrees, bed rail use towards R side. RN provided medications while patient sat EOB. Pt requires maxA>minA during x3-4 stand pivot and sit<>stand transfers bed<>wheelchair<>toilet. Pt sat in transport wheelchair to complete hair washing, bathing and grooming at sink for energy conservation. Pt completed anterior peri hygiene on toilet with SBA however requires maxA for posterior peri hygiene and toilet t/f using grab bars BUE to stand. Pt presented with increased (+) DOE, pale skin tone and reduced responsiveness when seated on toilet and required efficient return to transport w/c>bed, patient placed in trendelenburg and recovered. OT provided patient education on effects of experienced pain and activity tolerance on blood pressure during OOB tasks. Pt thanked therapist and was joking end of session, eyes closed. Pt progressing towards goals as they demonstrate improved stand pivot activity tolerance, OOB ADL independence seated at sink. Pt continues to present with deficits in decreased ROM, decreased strength, decreased  independence with ADLs, decreased endurance/activity tolerance, balance deficits, decreased safety awareness, and decreased independence with IADLs would continue to benefit from skilled Occupational Therapy services to increase safe and independent participation in BADL's. Rec AR.    Treatment Activities: BADL training, Functional mobility/transfer training, ECT/pacing, Pt education    Educated the patient to role of occupational therapy, plan of care, goals of therapy and safety with mobility and ADLs, energy conservation techniques.    Plan:   OT Frequency Recommended: 4-5x/wk     Continue plan of care.    Unit: [facility]  Bed: Z610/R604.54        Precautions and Contraindications:   Weight Bearing Status: LLE WBAT  Precaution Instructions Given to Patient: Yes  Fall Risks: High, Impaired balance/gait, Impaired mobility, Orthopedic patient  Other Precautions: Falls, Orthostatic    Updated Medical Status/Imaging/Tests/Labs:  Rad: reviewed   Lab Results   Component Value Date/Time    HGB 7.9 (L) 09/26/2023 05:21 AM    HGB 6.9 (L) 09/08/2022 05:59 AM    HGB 12.4 08/07/2022 06:36 AM    HCT 25.3 (L) 09/26/2023 05:21 AM    HCT 21.9 (L) 09/08/2022 05:59 AM    K 4.1 09/26/2023 05:21 AM    K 3.9 09/06/2022 03:43 AM    K 3.4 (L) 08/07/2022 06:36 AM    NA 139 09/26/2023 05:21 AM    NA 140 09/06/2022 03:43 AM    NA 144 08/07/2022 06:36 AM    INR 1.1 09/23/2023 04:38 PM    INR 1.2 (H) 09/05/2022 05:34 PM    INR 1.0 06/09/2022 11:42 AM  Consult received for Laura Mclaughlin for OT Evaluation and Treatment.  Patient's medical condition is appropriate for Occupational Therapy intervention at this time.    Subjective:  "I felt like- woah!" "Don't watch that TikTok."   Patient is agreeable to participation in therapy session and Nursing clears patient for therapy.    Patient Goal: Patient Goal: To get better, be independent  Pain:   Pain Assessment: Numeric Scale (0-10)  Pain Score: 7-severe pain  Pain  Location: Leg  Pain Orientation: Left    Pain interventions: premedicated, re-positioned, and rest    Objective:   Patient is in bed with dressings in place.  Pt wore mask during therapy session:No      Vital Signs:  BP Supine:  107/66 mmHg  BP Sitting: 116/67 mmHg  BP after activity: 99/64> 103/68 mmHg, supine in bed  HR Supine: 81 bpm  HR after activity: 86 bpm  SpO2 at rest: 100% on room air  SpO2 with activity: 98%  on room air  (+) DOE    Cognitive Status and Neuro Exam:  Arousal/Alertness: appropriate responses to stimuli  Attention span: appears intact  Orientation Level: Oriented x4  Memory: appears intact  Follows commands: independent  Safety Awareness: mod verbal instruction    Activities of Daily Living:  Eating: independent at EOB  Grooming: SBA at sink for oral care and facial care and to wash hair  Bathing: Min A Mod A at sink  UB Dressing: supervision at sink  LB Dressing: Max A  Toileting: Min A Max A for LB clothing management  for pericare    Bed Mobility:   Rolling: Supervision    Supine to Sit: MinA  Sit to Supine: ModA  Scooting to HOB: CGA, MinA  Scooting to EOB: CGA    Functional Transfers/Mobility:  Sit to Stand: MinA, ModA, MaxA w/ no AD (assist levels depended on experienced pain and BP)  Stand to Sit: MinA, ModA w/ no AD  Bed to Wheelchair: MinA, without AD, side scoot vs. Squat pivot  Wheelchair to Bed: modA, stand/squat pivot  Wheelchair <> [employer]: modA>maxA stand pivot, railing use required    PMP Activity: Step 5 - Chair    Balance:  Static Sitting: good  Dynamic Sitting: good good-  Static Standing: poor  Dynamic Standing: fair-    Therapeutic Exercises:  During ADL participation       Participation and Activity Tolerance:  Participation Effort: excellent  Endurance: good-    Patient left with call bell within reach, all needs met,   SCD's off (as found)  Floor mat not in place  Bed alarm on  and all questions answered. RN notified of session outcome and patient  response.      Goals:  Time For Goal Achievement: 5 visits  ADL Goals  Patient will groom self: Moderate Assist, at sinkside (SBA in sitting this date)  Patient will dress lower body: Minimal Assist, with AE  Patient will toilet: Minimal Assist, Moderate Assist  Mobility and Transfer Goals  Pt will perform functional transfers: Minimal Assist, with rolling walker                         PPE worn during session: procedural mask and gloves    Tech present: N/A  PPE worn by tech: N/A        Marylene Buerger, MS, OTR/L  Can be reached via Secure Chat       Time  of Treatment:   OT Received On: 09/30/23  Start Time: 0956  Stop Time: 1053  Time Calculation (min): 57 min     5 of 5 visits, Recommend additional 5-8 visits to maximize functional outcomes

## 2023-09-30 NOTE — Progress Notes (Signed)
Nursing Progress Note:    Neuro: Pt alert and oriented x4  GIGU:  Pt tolerating regular diet, denies nausea and vomiting. Voiding through ext cath, lbm 11/22  Cardiac: Regular HR,    Pulm: Regular respirations, room air  Skin:  x4 incision to left thigh, x1 incision to rt knees with staples open to air, c/d/I, scattered scars and bruising  Pain: 40/10, managed with scheduled pain medicines  Mobility:  x2 bed mobility, PT/OT consulted  Safety: Pt at fall risk. Fall precautions in place: bed in lowest position, fall mat in place, bed alarm on, call light is within reach.  Active lines : 22G, RAC, c/d/I, SL

## 2023-09-30 NOTE — PT Progress Note (Signed)
Physical Therapy Note    Physical Therapy Treatment  [name]  Post Acute Care Therapy Recommendations:     Discharge Recommendations:  Acute Rehab    If Acute Rehab  recommended discharge disposition is not available, patient will need 2-person assist for ADLs and transfers, with HPT, and transport into home/apartment as stairs has not been cleared.      DME needs IF patient is discharging home: Front wheel walker, [facility]    Therapy discharge recommendations may change with patient status.  Please refer to most recent note for up-to-date recommendations.  Patient anticipated to benefit from and to be able to engage in 3 hours of therapy a day for 5 days a week.     Assessment:   Significant Findings: none    Patient eager to participate in PT, despite pain. Sup > sit transfer completed slowly today partly due to earlier bout of orthostatic hypotension. Vitals stable throughout, see below. She tolerated standing for 1 min x2 with FWW, each time only requiring CGA for steadying. She has good awareness of deficits, good motivation, and excellent participation; she will continue to benefit from skilled PT while in house to improve functional mobility, strength, stability, endurance, and independence upon discharge.    Assessment: Decreased UE strength, Decreased LE strength, Decreased endurance/activity tolerance, Decreased functional mobility, Decreased balance, Gait impairment  Progress: Progressing toward goals  Prognosis: Good, With continued PT status post acute discharge  Risks/Benefits/POC Discussed with Pt/Family: With patient/family    Treatment Activities:    Therapeutic Exercises: sitting at EOB, sup <> sit training   Gait Training: sit <> stand training (cues for hand placement), standing tolerance     Patient left without needs and call bell within reach. RN notified of session outcome.   Educated the patient to role of physical therapy, plan of care, goals of therapy and HEP.    Plan:    Treatment/Interventions: Investment banker, operational, Exercise, Functional transfer training, LE strengthening/ROM, Endurance training, Patient/family training, Equipment eval/education, Bed mobility        PT Frequency: 4-5x/wk     Continue plan of care.    Unit: [facility]  Bed: M841/L244.01     Precautions and Contraindications:   Precautions  Weight Bearing Status: LLE WBAT;RLE WBAT  Fall Risks: High;Orthopedic patient  Other Precautions: Falls, Orthostatic    Updated Medical Status/Imaging/Labs:     No results found.  Results       Procedure Component Value Units Date/Time    Culture and Gram Stain, Aerobic and Anaerobic Bacteria, Wound/Tissue/Fluid (Order) [[id number]] Collected: 09/23/23 1317    Specimen: Swab from Femur, Left Updated: 09/30/23 1049    Narrative:      The following orders were created for panel order Culture and Gram Stain, Aerobic and Anaerobic Bacteria, Wound/Tissue/Fluid (Order).  Procedure                               Abnormality         Status                     ---------                               -----------         ------  Culture And Gram Stain, .Marland KitchenMarland Kitchen[[id number]]                      Final result               Culture Anaerobic Bacter.Marland KitchenMarland Kitchen[[id number]]  Normal              Final result                 Please view results for these tests on the individual orders.    Culture And Gram Stain, Aerobic Bacteria, Wound/Tissue/Fluid [[id number]] Collected: 09/23/23 1317    Specimen: Swab from Femur, Left Updated: 09/30/23 1049     Culture Aerobic Bacteria No growth     Gram Stain Few WBCs      No squamous epithelial cells seen      No organisms seen    Culture and Gram Stain, Aerobic and Anaerobic Bacteria, Wound/Tissue/Fluid (Order) [[id number]] Collected: 09/23/23 1315    Specimen: Swab from Femur, Left Updated: 09/30/23 1046    Narrative:      The following orders were created for panel order Culture and Gram Stain, Aerobic and Anaerobic Bacteria, Wound/Tissue/Fluid  (Order).  Procedure                               Abnormality         Status                     ---------                               -----------         ------                     Culture And Gram Stain, .Marland KitchenMarland Kitchen[[id number]]                      Final result               Culture Anaerobic Bacter.Marland KitchenMarland Kitchen[[id number]]  Normal              Final result                 Please view results for these tests on the individual orders.    Culture And Gram Stain, Aerobic Bacteria, Wound/Tissue/Fluid [[id number]] Collected: 09/23/23 1315    Specimen: Swab from Femur, Left Updated: 09/30/23 1046     Culture Aerobic Bacteria No growth     Gram Stain Few WBCs      No squamous epithelial cells seen      No organisms seen              Subjective:    "I need to work on my arm strength too, particularly my right arm."  Patient Goal: Go to rehab     Patient's medical condition is appropriate for Physical Therapy intervention at this time.  Patient is agreeable to participation in the therapy session. Nursing clears patient for therapy.    Patient Goal: Go to rehab    Pain Assessment  Pain Assessment: Numeric Scale (0-10)  Pain Score: 7-severe pain  Pain Intervention(s): Medication (See eMAR) (RN aware and provided meds during session)     Objective:   Patient is in bed with peripheral IV and female external catheter in place.  Pt wore mask during therapy session:No  Husband present throughout most of session.     Cognition  Cognition/Neuro Status  Arousal/Alertness: Appropriate responses to stimuli  Attention Span: Appears intact  Orientation Level: Oriented X4  Memory: Appears intact  Behavior: attentive;calm;cooperative (slightly lethargic)     Observation of Patient/Vital signs:  Pre-activity (in supine): 97/61 mmHg     80 bpm     95 % O2 on RA  During activity (after sitting at EOB for 10 min): 104/61 mmHg     79 bpm       Post-activity (after standing twice, still sitting at EOB): 126/13mmHg     79 bpm          Functional Mobility:  Supine  to Sit: Minimal Assist;HOB raised;to Right  Sit to Supine: Minimal Assist;to Left  Sit to Stand: Maximal Assist;Increased Time;Increased Effort;bed elevated;with instruction for hand placement to increase safety  Stand to Sit: Maximal Assist     Ambulation:  PMP - Progressive Mobility Protocol   PMP Activity: Step 4 - Dangle at Bedside  Distance Walked (ft) (Step 6,7): 0 Feet     Patient Participation: good   Patient Endurance: good     Balance  Static Sitting: good   Dynamic Sitting: good   Static Standing: fair   Dynamic Standing:  not tested     Patient left with call bell within reach, all needs met, SCDs off (as found), bed alarm on, and all questions answered. RN notified of session outcome and patient response.     Goals  Goal Formulation: With patient  Time for Goal Acheivement: 7 visits  Goals: Select goal  Pt Will Go Supine To Sit: with minimal assist  Pt Will Perform Sit To Supine: Goal met  Pt Will Transfer Bed/Chair: with rolling walker, with contact guard assist, Partly met  Pt Will Ambulate: 31-50 feet, with rolling walker, with contact guard assist    PPE worn during session: procedural mask and gloves  Tech present: None  PPE worn by tech: N/A  Translator: None    Time of Treatment:  PT Received On: 09/30/23  Start Time: 1325  Stop Time: 1406  Time Calculation (min): 41 min    Treatment # 5 out of 7 visits    -Albertha Ghee, PT, DPT, MS

## 2023-09-30 NOTE — Progress Notes (Signed)
Checked with April at [employer]; the authorization is still pending for this patient.  Will request pt/ot see patient this morning in case the insurance company request updated notes.  Will await to hear from April about authorization.

## 2023-09-30 NOTE — Plan of Care (Signed)
Problem: Pain interferes with ability to perform ADL  Goal: Pain at adequate level as identified by patient  Outcome: Progressing     Problem: Side Effects from Pain Analgesia  Goal: Patient will experience minimal side effects of analgesic therapy  Outcome: Progressing     Problem: Moderate/High Fall Risk Score >5  Goal: Patient will remain free of falls  Outcome: Progressing     Problem: Compromised Sensory Perception  Goal: Sensory Perception Interventions  Outcome: Progressing     Problem: Compromised Activity/Mobility  Goal: Activity/Mobility Interventions  Outcome: Progressing     Problem: Compromised Nutrition  Goal: Nutrition Interventions  Outcome: Progressing     Problem: Compromised Friction/Shear  Goal: Friction and Shear Interventions  Outcome: Progressing     Problem: Safety  Goal: Patient will be free from injury during hospitalization  Outcome: Progressing  Goal: Patient will be free from infection during hospitalization  Outcome: Progressing     Problem: Pain  Goal: Pain at adequate level as identified by patient  Outcome: Progressing     Problem: Discharge Barriers  Goal: Patient will be discharged home or other facility with appropriate resources  Outcome: Progressing     Problem: Psychosocial and Spiritual Needs  Goal: Demonstrates ability to cope with hospitalization/illness  Outcome: Progressing     Problem: Impaired Mobility  Goal: Mobility/Activity is maintained at optimal level for patient  Outcome: Progressing     Problem: Peripheral Neurovascular Impairment  Goal: Extremity color, movement, sensation are maintained or improved  Outcome: Progressing     Problem: Compromised skin integrity  Goal: Skin integrity is maintained or improved  Outcome: Progressing     Problem: Nutrition  Goal: Nutritional intake is adequate  Outcome: Progressing     Problem: Compromised Moisture  Goal: Moisture level Interventions  Outcome: Progressing

## 2023-09-30 NOTE — Anesthesia Postprocedure Evaluation (Signed)
Anesthesia Post Evaluation    Patient: Laura Mclaughlin    Procedure(s):  LEFT REPAIR, NONUNION, FEMUR W/ GRAFT    Anesthesia type: general    Last Vitals:   Vitals Value Taken Time   BP 116/78 09/23/23 1720   Temp 36.5 C (97.7 F) 09/23/23 1643   Pulse 72 09/23/23 1720   Resp 25 09/23/23 1720   SpO2 98 % 09/23/23 1720                 Anesthesia Post Evaluation:     Patient Evaluated: PACU  Patient Participation: complete - patient participated  Level of Consciousness: awake and alert    Pain Management: adequate  Multimodal analgesia pain management approach  Strategies: steroids, local anesthesia and acetaminophen  Airway Patency: patent  Two or more mitigation strategies used for obstructive sleep apnea.  Strategies: multimodal analgesia, extubation/recovery carried out in nonsupine position and verification of full NMB reversal    Anesthetic complications: No      PONV Status: none    Cardiovascular status: acceptable  Respiratory status: acceptable  Hydration status: acceptable          Signed by: Allayne Butcher, MD, 09/30/2023 7:54 PM

## 2023-09-30 NOTE — Plan of Care (Signed)
VSS on RA  Regular diet, -N/-V  Voiding via ext cath; adequate UOP  LBM 11/22  PIC RAC - SL   Bed mobility x2  x4 incision L thigh; x1 incision R knee w staples c/d/I  Pain controlled with PRN oxy, sched robaxin    Problem: Pain interferes with ability to perform ADL  Goal: Pain at adequate level as identified by patient  Outcome: Progressing  Flowsheets (Taken 09/30/2023 1409)  Pain at adequate level as identified by patient:   Assess pain on admission, during daily assessment and/or before any "as needed" intervention(s)   Reassess pain within 30-60 minutes of any procedure/intervention, per Pain Assessment, Intervention, Reassessment (AIR) Cycle     Problem: Side Effects from Pain Analgesia  Goal: Patient will experience minimal side effects of analgesic therapy  Outcome: Progressing  Flowsheets (Taken 09/30/2023 1409)  Patient will experience minimal side effects of analgesic therapy:   Monitor/assess patient's respiratory status (RR depth, effort, breath sounds)   Assess for changes in cognitive function     Problem: Moderate/High Fall Risk Score >5  Goal: Patient will remain free of falls  Outcome: Progressing  Flowsheets (Taken 09/30/2023 1409)  Moderate Risk (6-13): MOD-Floor mat at bedside (where available) if appropriate     Problem: Compromised Nutrition  Goal: Nutrition Interventions  Outcome: Progressing  Flowsheets (Taken 09/30/2023 1409)  Nutrition Interventions: Discuss nutrition at Rounds, I&Os, Document % meal eaten,      Problem: Safety  Goal: Patient will be free from injury during hospitalization  Outcome: Progressing  Flowsheets (Taken 09/30/2023 1409)  Patient will be free from injury during hospitalization:   Assess patient's risk for falls and implement fall prevention plan of care per policy   Provide and maintain safe environment   Use appropriate transfer methods   Include patient/ family/ care giver in decisions related to safety  Goal: Patient will be free from infection during  hospitalization  Outcome: Progressing  Flowsheets (Taken 09/30/2023 1409)  Free from Infection during hospitalization:   Assess and monitor for signs and symptoms of infection   Monitor lab/diagnostic results   Monitor all insertion sites (i.e. indwelling lines, tubes, urinary catheters, and drains)   Encourage patient and family to use good hand hygiene technique     Problem: Pain  Goal: Pain at adequate level as identified by patient  Outcome: Progressing  Flowsheets (Taken 09/30/2023 1409)  Pain at adequate level as identified by patient:   Assess pain on admission, during daily assessment and/or before any "as needed" intervention(s)   Reassess pain within 30-60 minutes of any procedure/intervention, per Pain Assessment, Intervention, Reassessment (AIR) Cycle     Problem: Discharge Barriers  Goal: Patient will be discharged home or other facility with appropriate resources  Outcome: Progressing  Flowsheets (Taken 09/30/2023 1409)  Discharge to home or other facility with appropriate resources: Provide appropriate patient education     Problem: Psychosocial and Spiritual Needs  Goal: Demonstrates ability to cope with hospitalization/illness  Outcome: Progressing  Flowsheets (Taken 09/30/2023 1409)  Demonstrates ability to cope with hospitalizations/illness: Provide quiet environment     Problem: Impaired Mobility  Goal: Mobility/Activity is maintained at optimal level for patient  Outcome: Progressing  Flowsheets (Taken 09/30/2023 1409)  Mobility/activity is maintained at optimal level for patient: Consult/collaborate with Physical Therapy and/or Occupational Therapy     Problem: Compromised skin integrity  Goal: Skin integrity is maintained or improved  Outcome: Progressing  Flowsheets (Taken 09/30/2023 1409)  Skin integrity is maintained or improved:  Assess Braden Scale every shift   Increase activity as tolerated/progressive mobility   Keep skin clean and dry

## 2023-10-01 ENCOUNTER — Ambulatory Visit (INDEPENDENT_AMBULATORY_CARE_PROVIDER_SITE_OTHER): Payer: Medicare (Managed Care) | Admitting: Student in an Organized Health Care Education/Training Program

## 2023-10-01 NOTE — PT Progress Note (Addendum)
Physical Therapy Treatment  Laura Mclaughlin  Post Acute Care Therapy Recommendations:     Discharge Recommendations:  Acute Rehab    If Acute Rehab  recommended discharge disposition is not available, patient will need 2-person assist for ADLs and transfers, with HPT, and transport into home/apartment as stairs has not been cleared.     DME needs IF patient is discharging home: Front wheel walker, BSC, Wheelchair-manual    Therapy discharge recommendations may change with patient status.  Please refer to most recent note for up-to-date recommendations.    Patient anticipated to benefit from and to be able to engage in 3 hours of therapy a day for 5 days a week.    Assessment:   Significant Findings: n/a    Pt agreeable to participate in PT, reports limited by fatigue and pain. Pt completed bed mobility with minA at LLE>RLE for advancement/elevation. Pt able to attain standing with modA and initiate side stepping along EOB ~4' with modA, RW and increased time/effort.  Pt is motivated to return to PLOF and maximize independence with mobility. Pt will continue to benefit from skilled physical therapy in the acute care setting to address noted limitations and progress towards PLOF.      Assessment: Decreased LE ROM, Decreased LE strength, Decreased endurance/activity tolerance, Decreased functional mobility, Decreased balance, Gait impairment  Progress: Progressing toward goals  Prognosis: Good, With continued PT status post acute discharge  Risks/Benefits/POC Discussed with Pt/Family: With patient  Patient left without needs and call bell within reach. RN notified of session outcome.     Treatment Activities: bed mobility, sit <> stand, gait, balance, ther ex, therapeutic activity, patient education    Educated the patient to role of physical therapy, plan of care, goals of therapy and HEP, safety with mobility and ADLs, transfer technique, bed mobility, weight shifting and initiation of side stepping.    Plan:    Treatment/Interventions: Investment banker, operational, Exercise, Functional transfer training, LE strengthening/ROM, Endurance training, Patient/family training, Equipment eval/education, Bed mobility, Neuromuscular re-education        PT Frequency: 4-5x/wk     Continue plan of care.    Unit: [facility]  Bed: Z610/R604.54     Precautions and Contraindications:   Precautions  Weight Bearing Status: LLE WBAT;RLE WBAT  Fall Risks: High;Impaired balance/gait;Impaired mobility;Orthopedic patient  Other Precautions: falls    Updated Medical Status/Imaging/Labs:   No results found.  Lab Results   Component Value Date/Time    HGB 7.9 (L) 09/26/2023 05:21 AM    HGB 6.9 (L) 09/08/2022 05:59 AM    HGB 12.4 08/07/2022 06:36 AM    HCT 25.3 (L) 09/26/2023 05:21 AM    HCT 21.9 (L) 09/08/2022 05:59 AM    K 4.1 09/26/2023 05:21 AM    K 3.9 09/06/2022 03:43 AM    K 3.4 (L) 08/07/2022 06:36 AM    NA 139 09/26/2023 05:21 AM    NA 140 09/06/2022 03:43 AM    NA 144 08/07/2022 06:36 AM    INR 1.1 09/23/2023 04:38 PM    INR 1.2 (H) 09/05/2022 05:34 PM    INR 1.0 06/09/2022 11:42 AM       Subjective: "I moved better than last time."   Patient Goal: Go to rehab    Pain Assessment  Pain Assessment: Numeric Scale (0-10)  Pain Score: 6-moderate pain  Pain Location: Leg  Pain Orientation: Right;Left (L>R)  Pain Intervention(s): Repositioned (RN updated)  Patient's medical condition is appropriate for Physical Therapy intervention at this time.  Patient is agreeable to participation in the therapy session. Nursing clears patient for therapy.    Objective:   Observation of Patient/Vital Signs:  Patient is in bed with female external catheter in place.     Pt wore mask during therapy session:No      Cognition/Neuro Status  Arousal/Alertness: Appropriate responses to stimuli  Attention Span: Appears intact  Orientation Level: Oriented X4  Memory: Appears intact  Following Commands: Follows all commands and directions without  difficulty  Safety Awareness: minimal verbal instruction  Insights: Educated in Engineer, building services            Vitals: stable throughout session, no signs/symptoms of distress      Functional Mobility:  Supine to Sit: Minimal Assist;HOB raised;to Right;using bedrail;Increased Effort;Increased Time (minA at LLE)  Scooting to EOB: Minimal Assist  Sit to Supine: Minimal Assist (assist LLE elevation)  Sit to Stand: Moderate Assist;Increased Time;Increased Effort;bed elevated;with instruction for hand placement to increase safety  Stand to Sit: Moderate Assist    Cues for hand placement, glut/quad recruitment, decreased knee extension LLE>RLE upon standing. Bed elevated due to decreased force production from lower surface.    Ambulation:  PMP - Progressive Mobility Protocol   PMP Activity: Step 5 - Chair     Ambulation: Moderate Assist;with front-wheeled walker  Pattern: L decreased stance time;R flexed knee;L flexed knee;decreased cadence (shuffled steps; side step along EOB ~4') Incr. Time/effort to complete side step; assist for balance, weight shifting, RW management, LLE guarded due to decreased quad recruitment however no significant knee buckle noted.     Therapeutic Exercise  Supine: ankle pump, quad set, heel slide, hip abduction.  Sitting: LAQ, ankle DF, ankle PF, hip flexion  X5-10 reps BLE. Decreased AROM LLE due to pain, rest breaks per pt tolerance. Cues for form, technique, allowing for rest break.     Neuro Re-Ed  Standing Balance: standing weight shifting all planes;with support;moderate assist (RW)           Participation and Activity Tolerance:  Patient Participation: Good  Patient Endurance: Good    Patient left with call bell within reach, all needs met,   SCD's n/a  Floor mat not in place (as found)  Bed alarm on  and all questions answered. RN notified of session outcome and patient response.     Goals:  Goals  Goal Formulation: With patient  Time for Goal Acheivement: 7 visits  Goals: Select goal  Pt  Will Go Supine To Sit: with minimal assist  Pt Will Perform Sit To Supine: Goal met  Pt Will Transfer Bed/Chair: with rolling walker, with contact guard assist, Partly met  Pt Will Ambulate: 31-50 feet, with rolling walker, with contact guard assist    PPE worn during session: gloves, procedural mask      Time of Treatment  PT Received On: 10/01/23  Start Time: 1052  Stop Time: 1130  Time Calculation (min): 38 min  Treatment # 6 out of 7 visits    Benay Pillow, PT, DPT  Available via [employer]

## 2023-10-01 NOTE — Plan of Care (Signed)
Neuro: A/Ox4,   Respiratory: Lung sounds clear; patient denies SOB  Cardio: No tele, patient denies chest pain;   GU: Voiding via ex cath/ bedside commode  GI: regular diet-tolerating, -N/V; +flatus, LBM 11/26;   Pain control: No complaints of pain this shift  Mobility: x2 bed mobility  Safety: Safety precautions maintained and call bell within reach. Purposeful rounding done by RN, and patient's belongings are within reach    Problem: Pain interferes with ability to perform ADL  Goal: Pain at adequate level as identified by patient  Outcome: Progressing     Problem: Side Effects from Pain Analgesia  Goal: Patient will experience minimal side effects of analgesic therapy  Outcome: Progressing     Problem: Moderate/High Fall Risk Score >5  Goal: Patient will remain free of falls  Outcome: Progressing     Problem: Compromised Sensory Perception  Goal: Sensory Perception Interventions  Outcome: Progressing     Problem: Compromised Activity/Mobility  Goal: Activity/Mobility Interventions  Outcome: Progressing     Problem: Compromised Nutrition  Goal: Nutrition Interventions  Outcome: Progressing     Problem: Compromised Friction/Shear  Goal: Friction and Shear Interventions  Outcome: Progressing     Problem: Safety  Goal: Patient will be free from injury during hospitalization  Outcome: Progressing  Goal: Patient will be free from infection during hospitalization  Outcome: Progressing     Problem: Pain  Goal: Pain at adequate level as identified by patient  Outcome: Progressing     Problem: Discharge Barriers  Goal: Patient will be discharged home or other facility with appropriate resources  Outcome: Progressing     Problem: Psychosocial and Spiritual Needs  Goal: Demonstrates ability to cope with hospitalization/illness  Outcome: Progressing     Problem: Impaired Mobility  Goal: Mobility/Activity is maintained at optimal level for patient  Outcome: Progressing     Problem: Peripheral Neurovascular Impairment  Goal:  Extremity color, movement, sensation are maintained or improved  Outcome: Progressing     Problem: Compromised skin integrity  Goal: Skin integrity is maintained or improved  Outcome: Progressing     Problem: Nutrition  Goal: Nutritional intake is adequate  Outcome: Progressing     Problem: Compromised Moisture  Goal: Moisture level Interventions  Outcome: Progressing

## 2023-10-01 NOTE — OT Progress Note (Signed)
Occupational Therapy Treatment Laura Mclaughlin        Post Acute Care Therapy Recommendations:     Discharge Recommendations:  Acute Rehab    If Acute Rehab  recommended discharge disposition is not available, patient will need hands on assist for ADL's, functional mobility/transfers, and HHOT.     DME needs IF patient is discharging home: Front wheel walker, Wheelchair-manual, [facility]    Therapy discharge recommendations may change with patient status.  Please refer to most recent note for up-to-date recommendations.    Patient anticipated to benefit from and to be able to engage in 3 hours of therapy a day for 5 days a week.       Assessment:   Significant Findings: (+) orthostatic hypotension, recovers within ~2 minutes seated rest break    Pt received in bed and was agreeable to OT. Spouse present. Pt requires maxA>modA during sit>stand t/f from [facility] 2/2 pain BLE (L>R). Pt engaged in side steps to t/f bed>bedside commode>chair using FWW as external support, going towards R side with modA for steadying. Pt requires max verbal cues to initiate pursed lip breathing for pain management and to maximize activity tolerance in standing. OT provided patient/spouse education on benefits of OOB in chair activity and maximizing OOB toileting. Both agreeable. Pt progressing towards goals as they demonstrate improved activity tolerance standing to RW during toileting tasks. Pt continues to present with deficits in decreased independence with ADLs, decreased endurance/activity tolerance, balance deficits, decreased safety awareness, and decreased independence with IADLs would continue to benefit from skilled Occupational Therapy services to increase safe and independent participation in BADL's. Rec AR.    Treatment Activities: BADL training, Functional mobility/transfer training, ECT/pacing, Pt education    Educated the patient to role of occupational therapy, plan of care, goals of therapy and safety with mobility and ADLs,  weight bearing precautions.    Plan:   OT Frequency Recommended: 4-5x/wk     Continue plan of care.    Unit: [facility]  Bed: Z610/R604.54        Precautions and Contraindications:   Weight Bearing Status: LLE WBAT, RLE WBAT  Precaution Instructions Given to Patient: Yes  Fall Risks: High, Impaired balance/gait, Impaired mobility, Orthopedic patient  Other Precautions: Falls, Orthostatic    Updated Medical Status/Imaging/Tests/Labs:  Rad: reviewed   Lab Results   Component Value Date/Time    HGB 7.9 (L) 09/26/2023 05:21 AM    HGB 6.9 (L) 09/08/2022 05:59 AM    HGB 12.4 08/07/2022 06:36 AM    HCT 25.3 (L) 09/26/2023 05:21 AM    HCT 21.9 (L) 09/08/2022 05:59 AM    K 4.1 09/26/2023 05:21 AM    K 3.9 09/06/2022 03:43 AM    K 3.4 (L) 08/07/2022 06:36 AM    NA 139 09/26/2023 05:21 AM    NA 140 09/06/2022 03:43 AM    NA 144 08/07/2022 06:36 AM    INR 1.1 09/23/2023 04:38 PM    INR 1.2 (H) 09/05/2022 05:34 PM    INR 1.0 06/09/2022 11:42 AM       Consult received for Laura Mclaughlin for OT Evaluation and Treatment.  Patient's medical condition is appropriate for Occupational Therapy intervention at this time.    Subjective:  "I get nauseous sometimes, is that normal?"    Patient is agreeable to participation in therapy session and Nursing clears patient for therapy.    Patient Goal: Patient Goal: To get better, be independent  Pain:  Pain Assessment:  (Pt reports pain manageable at rest, increases greatly with WB BLE in standing to the point where she becomes nauseous)  Pain Score: 7-severe pain  Pain Location: Leg  Pain Orientation: Left    Pain interventions: premedicated, re-positioned, and rest    Objective:   Patient is seated in a bedside chair with dressings, female purewick, pillow under LLE, chair alarm in place.  Pt wore mask during therapy session:No      Vital Signs:  (+) nausea s/p t/f to bedside commode, patient improved with ~2 minute rest break. S/p t/f BSC>Chair, BP 99/68 mmHg.      Cognitive Status and Neuro Exam:  Arousal/Alertness: appropriate responses to stimuli  Attention span: appears intact  Orientation Level: Oriented x4  Memory: appears intact  Follows commands: independent  Safety Awareness: mod verbal instruction and max verbal instruction    Activities of Daily Living:  Eating: independent on BSC  Toileting: Max A on BSC for pericare, standing to RW    Bed Mobility:   Sit to Supine: CGA  Scooting to EOB: SBA    Functional Transfers/Mobility:  Sit to Stand: ModA, MaxA w/ RW  Stand to Sit: ModA w/ RW  BSC to Bedside Chair: ModA, stand-step transfer and with RW  Bed to BSC: ModA, stand-step transfer and with RW    PMP Activity: Step 5 - Chair    Balance:  Static Sitting: good  Dynamic Sitting: good  Static Standing: fair- poor  Dynamic Standing: poor    Therapeutic Exercises:  During ADL participation       Participation and Activity Tolerance:  Participation Effort: excellent  Endurance: fair+    Patient left with call bell within reach, all needs met,   SCD's off (as found)  Floor mat in place  Chair alarm in place  and all questions answered. RN notified of session outcome and patient response.      Goals:  Time For Goal Achievement: 5 visits  ADL Goals  Patient will groom self: Moderate Assist, at sinkside  Patient will dress lower body: Minimal Assist, with AE  Patient will toilet: Minimal Assist, Moderate Assist  Mobility and Transfer Goals  Pt will perform functional transfers: Minimal Assist, with rolling walker                         PPE worn during session: procedural mask and gloves    Tech present: N/A  PPE worn by tech: N/A        Marylene Buerger, MS, OTR/L  Can be reached via Secure Chat       Time of Treatment:   OT Received On: 10/01/23  Start Time: 1344  Stop Time: 1423  Time Calculation (min): 39 min     1 of 5 visits

## 2023-10-01 NOTE — Progress Notes (Addendum)
Orthopedic Trauma Daily Progress Note    10/01/2023 6:43 AM    Laura Mclaughlin is a 62 y.o. 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     Subjective: Doing well. Pain well controled. Patient Denies nausea, vomiting, or fevers.      Cx: no growth (final)    Physical Exam:  Vitals:    10/01/23 0326   BP: 96/56   Pulse: 73   Resp: 16   Temp: 98.4 F (36.9 C)   SpO2: 95%        Intake/Output Summary (Last 24 hours) at 10/01/2023 0643  Last data filed at 10/01/2023 0510  Gross per 24 hour   Intake 300 ml   Output 890 ml   Net -590 ml     Lab Results   Component Value Date    INR 1.1 09/23/2023       bilateral lower extremity:  Incisions well coapted with no sign of infection  +TA,GS  +EHL,FHL  Sensation intact light touch plantar foot (absent dorsal sensation at baseline 2/2 neuropathy)  Cap refill < 2 seconds  Pedal pulses palpable  No pain with passive ROM digits    Lab Results   Component Value Date    HGB 7.9 (L) 09/26/2023    HGB 7.6 (L) 09/25/2023    HGB 7.2 (L) 09/24/2023      Lab Results   Component Value Date    HCT 25.3 (L) 09/26/2023    HCT 24.1 (L) 09/25/2023    HCT 22.4 (L) 09/24/2023    Review of pertinent labs demonstrate:   - Hemodynamic stability with no significant changes.                Assessment: 62 y.o. 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     Plan:   - Mobility: Out of bed as tolerated with PT/OT   - Pain control: Continue to wean/titrate to appropriate oral regimen   - DVT Prophylaxis (per Ortho Trauma service Protocol): Ortho Trauma service primary patient: - ASA 81mg  BID for 3 weeks post-op (fractures from femoral shaft to foot, BMI < 40)   - Foley catheter status: no foley   - Further surgical plans: No further Orthopaedic plans    - RUE: WBAT   - LUE:  WBAT   - RLE:  WBAT   - LLE:  WBAT   - No transfusion indicated, but will continue to follow serial labs and titrate IV fluids & volume  expanders as indicated to optimize hemodynamics/volume status.   - Disposition: discharge today to [facility] pending auth    Jorja Loa, DPM       Attending Addendum/Attestation:    I have personally seen and examined this patient and have participated in their care. I have read and agree with the clinical information, including the physical exam and patient history as documented by the Resident.  The above note has been edited to reflect my corrections and findings, incorporate supplementary history and exam, and edit the Assessment and Plan as needed.    Agree with above    Ala Bent, MD  Orthopaedic Trauma  Office [phone]

## 2023-10-01 NOTE — Progress Notes (Signed)
Nursing Progress Note:     Neuro: Pt alert and oriented x4  GIGU:  Pt tolerating regular diet, denies nausea and vomiting. Voiding through ext cath, lbm 11/25  Cardiac: Regular HR,    Pulm: Regular respirations, room air  Skin:  x4 incision to left thigh, x1 incision to rt knees with staples open to air, c/d/I, scattered scars and bruising  Pain: 4/10, managed with scheduled pain medicines  Mobility:  x2 bed mobility, PT/OT consulted, recommended AR  Safety: Pt at fall risk. Fall precautions in place: bed in lowest position, fall mat in place, bed alarm on, call light is within reach.  Active lines : 22G, RAC, c/d/I, SL

## 2023-10-01 NOTE — Progress Notes (Signed)
Case Management Progress Note - DELAY      Patient: Laura Mclaughlin  Admission Date: 09/23/2023  7:58 AM  Active Hospital Problems    Diagnosis    Closed fracture of distal end of left femur, unspecified fracture morphology, initial encounter    Closed fracture of left distal femur       Length of stay: Hospital Day 8      Disposition: AR    Discharge Plan:   Discharge to [facility] / [address] [place], [place]  [address]    Reason for DELAY: Pending Insurance Auth              Sharion Dove  [facility] Case Manager   [facility] [place] [facility]   [phone]

## 2023-10-02 ENCOUNTER — Ambulatory Visit (INDEPENDENT_AMBULATORY_CARE_PROVIDER_SITE_OTHER): Payer: Self-pay

## 2023-10-02 DIAGNOSIS — S72432K Displaced fracture of medial condyle of left femur, subsequent encounter for closed fracture with nonunion: Secondary | ICD-10-CM

## 2023-10-02 DIAGNOSIS — S72422K Displaced fracture of lateral condyle of left femur, subsequent encounter for closed fracture with nonunion: Secondary | ICD-10-CM

## 2023-10-02 MED ORDER — POLYETHYLENE GLYCOL 3350 17 G PO PACK
17.0000 g | PACK | Freq: Every day | ORAL | Status: AC
Start: 2023-10-02 — End: ?
  Administered 2023-10-02 – 2023-10-04 (×2): 17 g via ORAL
  Filled 2023-10-02 (×4): qty 1

## 2023-10-02 NOTE — Progress Notes (Signed)
SHIFT SUMMARY:    LOC: A&O X4  CARDIOVASCULAR: regular  RESPIRATORY: clear, RA  GI: + BS, + flatus, LBM 10-02-23  GU: voids via x-cath or BSC adequate amounts  INTEGUMENTARY: scattered scars and bruising; staples to LLE X4, OTA; right knee graft site , OTA  CL/DRAINS: n/a  MOBILITY: WBAT BLE, up with 2 moderate SBA using FWW to [facility]  DIET: tolerating a regular diet with a good appetite  PAIN: BLE pain effectively controlled with scheduled medications  TELE: n/a  INTERPRETER: n/a    PLAN:   Continue with safety measures and pain management; PT/OT recommend AR when medically, Case Management following.

## 2023-10-02 NOTE — Plan of Care (Signed)
Problem: Pain interferes with ability to perform ADL  Goal: Pain at adequate level as identified by patient  Outcome: Progressing     Problem: Side Effects from Pain Analgesia  Goal: Patient will experience minimal side effects of analgesic therapy  Outcome: Progressing     Problem: Moderate/High Fall Risk Score >5  Goal: Patient will remain free of falls  Outcome: Progressing     Problem: Compromised Sensory Perception  Goal: Sensory Perception Interventions  Outcome: Progressing     Problem: Compromised Activity/Mobility  Goal: Activity/Mobility Interventions  Outcome: Progressing     Problem: Compromised Nutrition  Goal: Nutrition Interventions  Outcome: Progressing     Problem: Compromised Friction/Shear  Goal: Friction and Shear Interventions  Outcome: Progressing     Problem: Compromised Moisture  Goal: Moisture level Interventions  Outcome: Progressing     Problem: Safety  Goal: Patient will be free from injury during hospitalization  Outcome: Progressing  Goal: Patient will be free from infection during hospitalization  Outcome: Progressing     Problem: Pain  Goal: Pain at adequate level as identified by patient  Outcome: Progressing     Problem: Discharge Barriers  Goal: Patient will be discharged home or other facility with appropriate resources  Outcome: Progressing     Problem: Psychosocial and Spiritual Needs  Goal: Demonstrates ability to cope with hospitalization/illness  Outcome: Progressing     Problem: Impaired Mobility  Goal: Mobility/Activity is maintained at optimal level for patient  Outcome: Progressing     Problem: Peripheral Neurovascular Impairment  Goal: Extremity color, movement, sensation are maintained or improved  Outcome: Progressing     Problem: Compromised skin integrity  Goal: Skin integrity is maintained or improved  Outcome: Progressing     Problem: Nutrition  Goal: Nutritional intake is adequate  Outcome: Progressing

## 2023-10-02 NOTE — PT Progress Note (Addendum)
Physical Therapy Note    Physical Therapy Treatment Patient:  Laura Mclaughlin  Post Acute Care Therapy Recommendations:   Discharge Recommendations: Acute Rehab   D/C Milestones: n/a    If Acute Rehab recommended discharge disposition is not available, pt will need HHA for increased HOA for functional mobility/ADLs    DME needs IF patient is discharging home Front wheel walker, Wheelchair-manual for mobility, refer to OT eval/notes for ADL equipment likely [facility]    Therapy discharge recommendations may change with patient status.  Please refer to most recent note for up-to-date recommendations.    Patient anticipated to benefit from and to be able to engage in 3 hours of therapy a day for 5 days a week.    Assessment:   Significant Findings: pt would like to speak to CM    Pt presents motivated for mobility. Pt requires min A to initiate bringing LE to EOB but able to perform scooting. Performed STS 4x during session w/ cueing for hand placement & increased hip ext ot avoid excessive trunk flex. Educated on inc UE Wbing during amb particularly during L stand for pain management. Seated rest breaks required between ambulation attempts. Pt able to perform sidesteps as well as forward/backward amb w/ BSC used as chair follow. VC/HOA required for sequencing during amb & HOA to prevent post LOB during backward steps. Cueing for optimal return to bed angling rather than leaning backward w/ HOA required for proper performance. Pt inquiring on b/l SPC use sequencing given demo however educated on FWW safest AD at this time. No dizziness reported when questioned throughout session. Pt would continue to benefit from skilled physical therapy to increase strength and endurance as well as improve transfers and balance for improved functional mobility.         Vitals: stable    Assessment: Decreased UE strength, Decreased LE strength, Decreased endurance/activity tolerance, Decreased functional mobility, Decreased balance, Gait  impairment  Progress: Progressing toward goals  Prognosis: Good, With continued PT status post acute discharge, With home health nursing/aide  Risks/Benefits/POC Discussed with Pt/Family: With patient/family    Treatment Activities:  bed mobility/transfer training, sitting/standing tolerance & endurance training, NMR/balance training, AD Eval/training, gait training, ther ex    Educated the patient to role of physical therapy, plan of care, goals of therapy and safety with mobility and ADLs, energy conservation techniques, pursed lip breathing, weight bearing precautions.  Plan:   Treatment/Interventions: Exercise, Gait training, Functional transfer training, LE strengthening/ROM, Endurance training, Equipment eval/education, Bed mobility, Stair training, Neuromuscular re-education      PT Frequency: 4-5x/wk   Continue plan of care.    Verne Carrow [facility] [phone]     Precautions and Contraindications:   Precautions  Weight Bearing Status: LLE WBAT;RLE WBAT  Precaution Instructions Given to Patient: Yes  Fall Risks: High;History of fall(s);Impaired balance/gait;Impaired mobility;Muscle weakness;Orthopedic patient  Updated Medical Status/Imaging/Labs:  WBC   Date Value Ref Range Status   09/26/2023 6.69 3.10 - 9.50 x10 3/uL Final   09/08/2022 7.22 3.10 - 9.50 x10 3/uL Final   08/07/2022 11.1 (H) 4.0 - 11.0 K/cmm Final     RBC   Date Value Ref Range Status   09/26/2023 2.68 (L) 3.90 - 5.10 x10 6/uL Final   09/08/2022 2.31 (L) 3.90 - 5.10 x10 6/uL Final   08/07/2022 3.97 3.80 - 5.00 M/cmm Final     Hemoglobin   Date Value Ref Range Status   09/26/2023 7.9 (L) 11.4 - 14.8 g/dL Final  08/07/2022 12.4 12.0 - 16.0 gm/dL Final     Hgb   Date Value Ref Range Status   09/08/2022 6.9 (L) 11.4 - 14.8 g/dL Final     Hematocrit   Date Value Ref Range Status   09/26/2023 25.3 (L) 34.7 - 43.7 % Final   09/08/2022 21.9 (L) 34.7 - 43.7 % Final     MCV   Date Value Ref Range Status   09/26/2023 94.4 78.0 - 96.0 fL Final    09/08/2022 94.8 78.0 - 96.0 fL Final        No change in pmhx, psh, rads since admission refer to inital eval for detail.  Subjective:   Patient Goal: Go to rehab  Pain Assessment  Pain Assessment: Numeric Scale (0-10)  Pain Score: 5-moderate pain  Pain Location: Leg (L>R)  Pain Intervention(s): Medication (See eMAR);Repositioned;Ambulation/increased activity;Rest;Relaxation technique    Patient's medical condition is appropriate for Physical Therapy intervention at this time.  Patient is agreeable to participation in the therapy session. Nursing clears patient for therapy.  Objective:   Observation of Patient:  Patient is in bed with dressings, peripheral IV, and female external catheter in place.    Inspection/Posture: semisupine in bed w/ L LE elevated on pillow, incisions intact w/ edema noted  Cognition/Neuro Status  Behavior: attentive;cooperative;fearful  Orientation Level: Return to [facility]       Functional Mobility:  Supine to Sit: to Right;Minimal Assist;Increased Time;Increased Effort;HOB raised;using bedrail  Scooting to EOB: Contact Guard Assist;Increased Time;Increased Effort  Sit to Supine: to Left;Moderate Assist  Sit to Stand: Minimal Assist;Increased Time;Increased Effort;with instruction for hand placement to increase safety;bed elevated  Stand to Sit: Minimal Assist (to control descent)  Transfers  Posterior Scoot Transfers: Cabin crew Used for Functional Transfer: front-wheeled walker    Balance:  Sitting - Static: Good  Standing - Static: Good (unsteadiness noted brining UE from bed to AD reuqirng HOA, stable w/ AD once in full upright)       Ambulation:   PMP - Progressive Mobility Protocol   PMP Activity: Step 6 - Walks in Room  Distance Walked (ft) (Step 6,7): 3 Feet (x3)  Ambulation: with front-wheeled walker;Moderate Assist  Pattern: L decreased stance time;R foot decreased clearance;L foot decreased clearance;decreased cadence;decreased step length;Step through  Stair  Management: not attempted             Participation and Activity Tolerance:  Participation Effort: good  Endurance: Tolerates 30 min exercise with multiple rests    Patient left with call bell within reach, all needs met, SCDs off, fall mat down, bed alarm on cleared by RN and all questions answered. RN notified of session outcome and patient response.     Goals:  Goals  Goal Formulation: With patient  Time for Goal Acheivement: 7 visits  Goals: Select goal  Pt Will Go Supine To Sit: with minimal assist  Pt Will Perform Sit To Supine: Goal met  Pt Will Transfer Bed/Chair: with rolling walker, with contact guard assist, Partly met  Pt Will Ambulate: 31-50 feet, with rolling walker, with contact guard assist    PPE Worn by Therapist: gloves   Tech: N/A  PPE Worn by Patient:  No PPE required/worn   Translator: N/A    Marijean Bravo, PT, DPT, Pager [phone], 10/02/2023, 10:44 AM     Time of Treatment  PT Received On: 10/02/23  Start Time: 8413  Stop Time: 1029  Time Calculation (min): 39 min  Treatment #  7  out of 7 visits

## 2023-10-02 NOTE — Plan of Care (Signed)
Problem: Peripheral Neurovascular Impairment  Goal: Extremity color, movement, sensation are maintained or improved  Outcome: Progressing  Flowsheets (Taken 09/28/2023 1927 by Azucena Fallen., RN)  Extremity color, movement, sensation are maintained or improved: Assess extremity for proper alignment       Problem: Pain interferes with ability to perform ADL  Goal: Pain at adequate level as identified by patient  Outcome: Progressing  Flowsheets (Taken 09/30/2023 2036 by Marcille Blanco, RN)  Pain at adequate level as identified by patient:   Identify patient comfort function goal   Assess for risk of opioid induced respiratory depression, including snoring/sleep apnea. Alert healthcare team of risk factors identified.   Assess pain on admission, during daily assessment and/or before any "as needed" intervention(s)   Reassess pain within 30-60 minutes of any procedure/intervention, per Pain Assessment, Intervention, Reassessment (AIR) Cycle   Evaluate if patient comfort function goal is met   Evaluate patient's satisfaction with pain management progress   Offer non-pharmacological pain management interventions     Problem: Safety  Goal: Patient will be free from injury during hospitalization  Outcome: Progressing  Flowsheets (Taken 09/30/2023 1409 by Ellery Plunk, RN)  Patient will be free from injury during hospitalization:   Assess patient's risk for falls and implement fall prevention plan of care per policy   Provide and maintain safe environment   Use appropriate transfer methods   Include patient/ family/ care giver in decisions related to safety     Problem: Safety  Goal: Patient will be free from infection during hospitalization  Outcome: Progressing  Flowsheets (Taken 09/30/2023 1409 by Ellery Plunk, RN)  Free from Infection during hospitalization:   Assess and monitor for signs and symptoms of infection   Monitor lab/diagnostic results   Monitor all insertion sites (i.e. indwelling lines, tubes,  urinary catheters, and drains)   Encourage patient and family to use good hand hygiene technique     Problem: Impaired Mobility  Goal: Mobility/Activity is maintained at optimal level for patient  Outcome: Progressing  Flowsheets (Taken 09/30/2023 1409 by Ellery Plunk, RN)  Mobility/activity is maintained at optimal level for patient: Consult/collaborate with Physical Therapy and/or Occupational Therapy

## 2023-10-02 NOTE — Progress Notes (Addendum)
Orthopedic Trauma Daily Progress Note    10/02/2023 7:34 AM    Laura Mclaughlin Laura Mclaughlin is a 62 y.o. 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     Subjective: Doing well. No complaints. Pain well controled. Patient Denies nausea, vomiting, or fevers.      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                Assessment: 62 y.o. 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     Plan:   - Mobility: Out of bed as tolerated with PT/OT   - Pain control: Continue to wean/titrate to appropriate oral regimen   - DVT Prophylaxis (per Ortho Trauma service Protocol): Ortho Trauma service primary patient: - ASA 81mg  BID for 3 weeks post-op (fractures from femoral shaft to foot, BMI < 40)   - Foley catheter status: no foley   - Further surgical plans: No further Orthopaedic plans    - RUE: WBAT   - LUE:  WBAT   - RLE:  WBAT   - LLE:  WBAT   - No transfusion indicated, but will continue to follow serial labs and titrate IV fluids & volume expanders as indicated to optimize hemodynamics/volume status.   - Disposition:  discharge today to [facility] pending auth    Jorja Loa, DPM        Attending Addendum/Attestation:    I have personally seen and examined this patient and have participated in their care. I have read and agree with the clinical information, including the physical exam and patient history as documented by the Resident.  The above note has been edited to reflect my corrections and findings, incorporate supplementary history and exam, and edit the Assessment and Plan as needed.        Emiliano Dyer, MD  Orthopaedic Trauma  Pager [phone]  Office [phone]

## 2023-10-03 NOTE — OT Progress Note (Signed)
Occupational Therapy Treatment  Laura Mclaughlin        Post Acute Care Therapy Recommendations:   Discharge Recommendations:  Acute Rehab    If Acute Rehab  recommended discharge disposition is not available, patient will need mod-max hands on assist for adls, mobility, HHOT and equipment below.     DME needs IF patient is discharging home: Front wheel walker, Wheelchair-manual, [facility]    Therapy discharge recommendations may change with patient status.  Please refer to most recent note for up-to-date recommendations.    Patient anticipated to benefit from and to be able to engage in 3 hours of therapy a day for 5 days a week.       Assessment:   Significant Findings: none    Pt received in bed, very motivated to get up and walk.  With increased time she was able to get both legs off the bed and scoot forward in sitting.   She was able to stand  w/ mod A and take 6 steps to the chair w/ v/cues for technique and reminder to move the RW.  Pt is progressing toward OT goals.  Pt demonstrates the following impairments which require continued skilled OT treatment: deficits in ADL's, functional mobility, endurance, balance.  Patient left without needs and call bell within reach. RN notified of session outcome.    Treatment Activities:  Educated the patient to role of occupational therapy, plan of care, goals of therapy and safety with mobility and ADLs, log-rolling, functional mobility, compensatory techniques for ADLs, UE therex, adaptive equipment available, d/c options and recommendations.    Plan:   OT Frequency Recommended: 4-5x/wk     Continue plan of care.    Unit: [facility]  Bed: K270/W237.62     Precautions and Contraindications:   falls    Updated Medical Status/Imaging/Labs:  reviewed    Subjective:   Patient's medical condition is appropriate for Occupational Therapy intervention at this time.  Patient is agreeable to participation in the therapy session. Nursing clears patient for  therapy.    Pain:   Scale: no c/o pain  Location:   Intervention:     Objective:   Patient received in bed with external catheter in place.  Pt wore mask during therapy session:No      Cognition  Intact, A+O, follows commands.     Functional Mobility  Rolling:  nt  Supine to Sit: cga, increased time to get LE's off bed and scoot forward  Sit to Stand: mod  Stand-sit: min w/ good controlled descent  Transfers: cga, increased time, v/cues to technique    PMP Activity: Step 6 - Walks in Room    Balance  Sitting: good  Standing: fair-    Self Care and Home Management  Eating: nt  Grooming: set up, seated  Bathing: nt  UE Dressing: nt  LE Dressing: max  Toileting: nt    Therapeutic Exercises  W/ adls, mobility    Participation: good  Endurance: good    Patient left with call bell within reach, all needs met, SCDs off, fall mat on, bed alarm off, chair alarm on and all questions answered. RN notified of session outcome and patient response.     Goals:  Time For Goal Achievement: 5 visits  ADL Goals  Patient will groom self: Moderate Assist, at sinkside  Patient will dress lower body: Minimal Assist, with AE  Patient will toilet: Minimal Assist, Moderate Assist  Mobility and Transfer Goals  Pt  will perform functional transfers: Minimal Assist, with rolling walker                         PPE worn during session: procedural mask and gloves    Tech present: no  PPE worn by tech: N/A      Laura Mclaughlin, OTR/L  Pager # [phone]    Time of Treatment  OT Received On: 10/03/23  Start Time: 1000  Stop Time: 1040  Time Calculation (min): 40 min  OT Cancellation: Visit  OT Visit Cancellation Reason: Not clinically indicated at this time (comment required) - Rehab decision  OT Cancellation Minutes: 15    Treatment # 2 of 5 visits

## 2023-10-03 NOTE — Plan of Care (Signed)
Pt A&O x 4, VSS on RA, c/o pain 5/10 to BLE relieved with PRN and scheduled medications reporting oxycodone and robaxin taken together beneficial, up with assist x 1 walker to chair, tolerating regular diet well, denies N/V, +flatus, reports having a BM yesterday, staples to incision BLE intact, scheduled patch applied to R chest, R PIV intact, POC updated to pt who verbalizes understanding, safety precautions in place.     Problem: Pain interferes with ability to perform ADL  Goal: Pain at adequate level as identified by patient  Outcome: Progressing  Flowsheets (Taken 10/03/2023 1644)  Pain at adequate level as identified by patient:   Identify patient comfort function goal   Assess for risk of opioid induced respiratory depression, including snoring/sleep apnea. Alert healthcare team of risk factors identified.   Assess pain on admission, during daily assessment and/or before any "as needed" intervention(s)   Reassess pain within 30-60 minutes of any procedure/intervention, per Pain Assessment, Intervention, Reassessment (AIR) Cycle   Evaluate if patient comfort function goal is met   Evaluate patient's satisfaction with pain management progress   Offer non-pharmacological pain management interventions   Consult/collaborate with Physical Therapy, Occupational Therapy, and/or Speech Therapy     Problem: Moderate/High Fall Risk Score >5  Goal: Patient will remain free of falls  Outcome: Progressing  Flowsheets (Taken 10/03/2023 1644)  Moderate Risk (6-13):   MOD-Floor mat at bedside (where available) if appropriate   MOD-Perform dangle, stand, walk (DSW) prior to mobilization   MOD-Apply bed exit alarm if patient is confused   MOD-Request PT/OT consult order for patients with gait/mobility impairment   MOD-include family in multidisciplinary POC discussions     Problem: Compromised Sensory Perception  Goal: Sensory Perception Interventions  Outcome: Progressing  Flowsheets (Taken 10/03/2023 0820)  Sensory  Perception Interventions: Offload heels, Pad bony prominences, Reposition q 2hrs/turn Clock, Q2 hour skin assessment under devices if present     Problem: Compromised Activity/Mobility  Goal: Activity/Mobility Interventions  Outcome: Progressing  Flowsheets (Taken 10/03/2023 0820)  Activity/Mobility Interventions: Pad bony prominences, TAP Seated positioning system when OOB, Promote PMP, Reposition q 2 hrs / turn clock, Offload heels     Problem: Pain  Goal: Pain at adequate level as identified by patient  Outcome: Progressing  Flowsheets (Taken 10/03/2023 1644)  Pain at adequate level as identified by patient:   Identify patient comfort function goal   Assess for risk of opioid induced respiratory depression, including snoring/sleep apnea. Alert healthcare team of risk factors identified.   Assess pain on admission, during daily assessment and/or before any "as needed" intervention(s)   Reassess pain within 30-60 minutes of any procedure/intervention, per Pain Assessment, Intervention, Reassessment (AIR) Cycle   Evaluate if patient comfort function goal is met   Evaluate patient's satisfaction with pain management progress   Offer non-pharmacological pain management interventions   Consult/collaborate with Physical Therapy, Occupational Therapy, and/or Speech Therapy     Problem: Discharge Barriers  Goal: Patient will be discharged home or other facility with appropriate resources  Outcome: Progressing  Flowsheets (Taken 09/30/2023 1409 by Ellery Plunk, RN)  Discharge to home or other facility with appropriate resources: Provide appropriate patient education     Problem: Impaired Mobility  Goal: Mobility/Activity is maintained at optimal level for patient  Outcome: Progressing  Flowsheets (Taken 09/30/2023 1409 by Ellery Plunk, RN)  Mobility/activity is maintained at optimal level for patient: Consult/collaborate with Physical Therapy and/or Occupational Therapy     Problem: Compromised skin integrity  Goal:  Skin integrity is maintained or improved  Outcome: Progressing  Flowsheets (Taken 10/03/2023 1644)  Skin integrity is maintained or improved:   Assess Braden Scale every shift   Turn or reposition patient every 2 hours or as needed unless able to reposition self   Increase activity as tolerated/progressive mobility   Relieve pressure to bony prominences   Avoid shearing   Keep skin clean and dry

## 2023-10-03 NOTE — Plan of Care (Signed)
Orientation: AOx4  Cardiac/Telemetry: HR and BP WNL  Resp/Airway: RA  GI: reg diet   GU: external cath  Pain: mod pain in BLE managed w/ scheduled and PRN pain meds  Mobility/Ambulation: OOB as tolerated w. OT/PT     Plan:   Pain mgmt       Problem: Pain interferes with ability to perform ADL  Goal: Pain at adequate level as identified by patient  Outcome: Progressing     Problem: Side Effects from Pain Analgesia  Goal: Patient will experience minimal side effects of analgesic therapy  Outcome: Progressing     Problem: Moderate/High Fall Risk Score >5  Goal: Patient will remain free of falls  Outcome: Progressing     Problem: Compromised Sensory Perception  Goal: Sensory Perception Interventions  Outcome: Progressing     Problem: Compromised Activity/Mobility  Goal: Activity/Mobility Interventions  Outcome: Progressing     Problem: Compromised Nutrition  Goal: Nutrition Interventions  Outcome: Progressing     Problem: Compromised Friction/Shear  Goal: Friction and Shear Interventions  Outcome: Progressing     Problem: Safety  Goal: Patient will be free from injury during hospitalization  Outcome: Progressing  Goal: Patient will be free from infection during hospitalization  Outcome: Progressing     Problem: Pain  Goal: Pain at adequate level as identified by patient  Outcome: Progressing     Problem: Discharge Barriers  Goal: Patient will be discharged home or other facility with appropriate resources  Outcome: Progressing     Problem: Psychosocial and Spiritual Needs  Goal: Demonstrates ability to cope with hospitalization/illness  Outcome: Progressing     Problem: Impaired Mobility  Goal: Mobility/Activity is maintained at optimal level for patient  Outcome: Progressing     Problem: Peripheral Neurovascular Impairment  Goal: Extremity color, movement, sensation are maintained or improved  Outcome: Progressing     Problem: Compromised skin integrity  Goal: Skin integrity is maintained or improved  Outcome:  Progressing     Problem: Nutrition  Goal: Nutritional intake is adequate  Outcome: Progressing     Problem: Compromised Moisture  Goal: Moisture level Interventions  Outcome: Progressing

## 2023-10-03 NOTE — Progress Notes (Signed)
Orthopedic Trauma Daily Progress Note    10/03/2023 8:25 AM    Laura Mclaughlin is a 62 y.o. 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     Subjective: Doing well. No complaints. Pain well controled. Patient Denies nausea, vomiting, or fevers.      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                Assessment: 62 y.o. 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     Plan:   - Mobility: Out of bed as tolerated with PT/OT   - Pain control: Continue to wean/titrate to appropriate oral regimen   - DVT Prophylaxis (per Ortho Trauma service Protocol): Ortho Trauma service primary patient: - ASA 81mg  BID for 3 weeks post-op (fractures from femoral shaft to foot, BMI < 40)   - Foley catheter status: no foley   - Further surgical plans: No further Orthopaedic plans    - RUE: WBAT   - LUE:  WBAT   - RLE:  WBAT   - LLE:  WBAT   - No transfusion indicated, but will continue to follow serial labs and titrate IV  fluids & volume expanders as indicated to optimize hemodynamics/volume status.   - Disposition: discharge today to [facility] pending auth    Jorja Loa, DPM

## 2023-10-04 ENCOUNTER — Encounter (HOSPITAL_BASED_OUTPATIENT_CLINIC_OR_DEPARTMENT_OTHER): Payer: Self-pay | Admitting: Student in an Organized Health Care Education/Training Program

## 2023-10-04 NOTE — Progress Notes (Signed)
Orthopedic Trauma Daily Progress Note    10/05/2023 6:24 AM    Laura Mclaughlin is a 62 y.o. 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     Subjective: Doing well. No complaints. Pain well controled. Patient Denies nausea, vomiting, or fevers.      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                Assessment: 62 y.o. 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     Plan:   - Mobility: Out of bed as tolerated with PT/OT   - Pain control: Continue to wean/titrate to appropriate oral regimen   - DVT Prophylaxis (per Ortho Trauma service Protocol): Ortho Trauma service primary patient: - ASA 81mg  BID for 3 weeks post-op (fractures from femoral shaft to foot, BMI < 40)   - Foley catheter status: no foley   - Further surgical plans: No further Orthopaedic plans    - RUE: WBAT   - LUE:  WBAT   - RLE:  WBAT   - LLE:  WBAT   - No transfusion indicated, but will continue to follow serial labs and titrate IV  fluids & volume expanders as indicated to optimize hemodynamics/volume status.   - Disposition: discharge home today    Elayne Snare, MD

## 2023-10-04 NOTE — Progress Notes (Signed)
Orthopedic Trauma Daily Progress Note    10/04/2023 7:03 AM    Laura Mclaughlin is a 62 y.o. 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     Subjective: Doing well. No complaints. Pain well controled. Patient Denies nausea, vomiting, or fevers.      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                Assessment: 62 y.o. 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     Plan:   - Mobility: Out of bed as tolerated with PT/OT   - Pain control: Continue to wean/titrate to appropriate oral regimen   - DVT Prophylaxis (per Ortho Trauma service Protocol): Ortho Trauma service primary patient: - ASA 81mg  BID for 3 weeks post-op (fractures from femoral shaft to foot, BMI < 40)   - Foley catheter status: no foley   - Further surgical plans: No further Orthopaedic plans    - RUE: WBAT   - LUE:  WBAT   - RLE:  WBAT   - LLE:  WBAT   - No transfusion indicated, but will continue to follow serial labs and titrate IV fluids & volume expanders as indicated to optimize hemodynamics/volume status.   - Disposition:  discharge today to [facility] pending auth    Jorja Loa, DPM

## 2023-10-04 NOTE — Plan of Care (Signed)
Neuro:  A/O x 4; impaired vision, wears glasses  CV:  No tele; normal S1/S2; denies chest pain, SOB, dizziness; VSS; +2 peripheral pulses; non-pitting edema BLE  Resp:  Room air; lung sounds clear; 2250 on IS  GI:  Regular diet; bowel sounds active; abdoemn soft, non-tender; passing gas, last BM 11/28; denies N/V  GU:  External catheter  MS:  WBAT BLE; x1-2 assist OOB w/ walker; weakness BLE  Integ:  Surgical incision x1 R leg, x3 L knee, staples, C/D/I; scattered scars  LDA:  22G RAC  Safety:  Moderate falls risk; bed in lowest position, floor mat present, exit alarm on, call bell and bedside table within reach  Pain:  Moderate pain BLE, R>L; PRN oxycodone, tylenol; scheduled gabapentin  Plan:  Monitor vitals; monitor I/O's; pain management; encourage deep breathing and IS use; PT/OT; discharge pending SNF/ARF approval    Problem: Pain interferes with ability to perform ADL  Goal: Pain at adequate level as identified by patient  Outcome: Progressing  Flowsheets (Taken 10/04/2023 1151)  Pain at adequate level as identified by patient:   Identify patient comfort function goal   Assess for risk of opioid induced respiratory depression, including snoring/sleep apnea. Alert healthcare team of risk factors identified.   Assess pain on admission, during daily assessment and/or before any "as needed" intervention(s)   Evaluate if patient comfort function goal is met   Reassess pain within 30-60 minutes of any procedure/intervention, per Pain Assessment, Intervention, Reassessment (AIR) Cycle   Evaluate patient's satisfaction with pain management progress   Offer non-pharmacological pain management interventions   Include patient/patient care companion in decisions related to pain management as needed     Problem: Safety  Goal: Patient will be free from injury during hospitalization  Outcome: Progressing  Flowsheets (Taken 10/04/2023 1151)  Patient will be free from injury during hospitalization:   Assess patient's risk for  falls and implement fall prevention plan of care per policy   Use appropriate transfer methods   Ensure appropriate safety devices are available at the bedside   Provide and maintain safe environment   Hourly rounding   Include patient/ family/ care giver in decisions related to safety   Assess for patients risk for elopement and implement Elopement Risk Plan per policy   Provide alternative method of communication if needed (communication boards, writing)  Goal: Patient will be free from infection during hospitalization  Outcome: Progressing  Flowsheets (Taken 10/04/2023 1151)  Free from Infection during hospitalization:   Assess and monitor for signs and symptoms of infection   Monitor lab/diagnostic results   Monitor all insertion sites (i.e. indwelling lines, tubes, urinary catheters, and drains)   Encourage patient and family to use good hand hygiene technique     Problem: Pain  Goal: Pain at adequate level as identified by patient  Outcome: Progressing  Flowsheets (Taken 10/04/2023 1151)  Pain at adequate level as identified by patient:   Identify patient comfort function goal   Assess for risk of opioid induced respiratory depression, including snoring/sleep apnea. Alert healthcare team of risk factors identified.   Assess pain on admission, during daily assessment and/or before any "as needed" intervention(s)   Evaluate if patient comfort function goal is met   Reassess pain within 30-60 minutes of any procedure/intervention, per Pain Assessment, Intervention, Reassessment (AIR) Cycle   Evaluate patient's satisfaction with pain management progress   Offer non-pharmacological pain management interventions   Include patient/patient care companion in decisions related to pain management as  needed     Problem: Impaired Mobility  Goal: Mobility/Activity is maintained at optimal level for patient  Outcome: Progressing  Flowsheets (Taken 10/04/2023 1151)  Mobility/activity is maintained at optimal level for  patient:   Encourage independent activity per ability   Consult/collaborate with Physical Therapy and/or Occupational Therapy     Problem: Peripheral Neurovascular Impairment  Goal: Extremity color, movement, sensation are maintained or improved  Outcome: Progressing  Flowsheets (Taken 10/04/2023 1151)  Extremity color, movement, sensation are maintained or improved:   Assess and monitor application of corrective devices (cast, brace, splint), check skin integrity   Assess extremity for proper alignment     Problem: Compromised skin integrity  Goal: Skin integrity is maintained or improved  Outcome: Progressing  Flowsheets (Taken 10/04/2023 1151)  Skin integrity is maintained or improved:   Assess Braden Scale every shift   Turn or reposition patient every 2 hours or as needed unless able to reposition self   Increase activity as tolerated/progressive mobility   Relieve pressure to bony prominences   Avoid shearing   Keep skin clean and dry   Encourage use of lotion/moisturizer on skin   Monitor patient's hygiene practices   Keep head of bed 30 degrees or less (unless contraindicated)

## 2023-10-04 NOTE — PT Progress Note (Addendum)
Physical Therapy Note    Physical Therapy Treatment Patient:  Laura Mclaughlin  Post Acute Care Therapy Recommendations:   Discharge Recommendations: Acute Rehab   D/C Milestones: n/a    If Acute Rehab recommended discharge disposition is not available, pt will need SNF if not HHA for increased HOA for functional mobility/ADLs    DME needs IF patient is discharging home Front wheel walker, Wheelchair-manual for mobility, refer to OT eval/notes for ADL equipment    Therapy discharge recommendations may change with patient status.  Please refer to most recent note for up-to-date recommendations.    Patient anticipated to benefit from and to be able to engage in 3 hours of therapy a day for 5 days a week.    Assessment:   Significant Findings: n/a    Pt presents motivated for  mobility & d/c to [facility]. Questioning SNF vs. HHPT vs. OPPT if not accepted by insurance. PT explaining frequency concerns & increased lvl of assist at inpatients PT locations vs home w/ family or aide assistance.  Pt requires assist from L LE to EOB. Increased trunk/hip flex initially in standing requiring Vc/Tc for correction w/ increased time noted  to achieve upright. Pt able to amb w/ AD decrease stance  time  on L,Cueing for sequencing of step to pattern w/ AD to avoid running into front bar. Pt would continue to benefit from skilled physical therapy to increase strength and endurance as well as improve transfers and balance for improved functional mobility.         Vitals: stable    Assessment: Decreased UE strength, Decreased LE strength, Decreased endurance/activity tolerance, Decreased functional mobility, Decreased balance, Gait impairment  Progress: Progressing toward goals  Prognosis: Good, With continued PT status post acute discharge, With home health nursing/aide  Risks/Benefits/POC Discussed with Pt/Family: With patient    Treatment Activities:  bed mobility/transfer training, sitting/standing tolerance & endurance training,  NMR/balance training, AD Eval/training, gait training, ther ex    Educated the patient to role of physical therapy, plan of care, goals of therapy and safety with mobility and ADLs, energy conservation techniques, pursed lip breathing.  Plan:   Treatment/Interventions: Exercise, Gait training, Functional transfer training, LE strengthening/ROM, Endurance training, Bed mobility, Equipment eval/education, Stair training, Neuromuscular re-education      PT Frequency: 4-5x/wk   Continue plan of care.    Verne Carrow [facility] [phone]     Precautions and Contraindications:   Precautions  Weight Bearing Status: LLE WBAT;RLE WBAT  Precaution Instructions Given to Patient: Yes  Fall Risks: History of fall(s);High;Impaired balance/gait;Impaired mobility;Muscle weakness  Updated Medical Status/Imaging/Labs:  WBC   Date Value Ref Range Status   09/26/2023 6.69 3.10 - 9.50 x10 3/uL Final   09/08/2022 7.22 3.10 - 9.50 x10 3/uL Final   08/07/2022 11.1 (H) 4.0 - 11.0 K/cmm Final     RBC   Date Value Ref Range Status   09/26/2023 2.68 (L) 3.90 - 5.10 x10 6/uL Final   09/08/2022 2.31 (L) 3.90 - 5.10 x10 6/uL Final   08/07/2022 3.97 3.80 - 5.00 M/cmm Final     Hemoglobin   Date Value Ref Range Status   09/26/2023 7.9 (L) 11.4 - 14.8 g/dL Final   09/81/1914 78.2 12.0 - 16.0 gm/dL Final     Hgb   Date Value Ref Range Status   09/08/2022 6.9 (L) 11.4 - 14.8 g/dL Final     Hematocrit   Date Value Ref Range Status   09/26/2023 25.3 (L)  34.7 - 43.7 % Final   09/08/2022 21.9 (L) 34.7 - 43.7 % Final     MCV   Date Value Ref Range Status   09/26/2023 94.4 78.0 - 96.0 fL Final   09/08/2022 94.8 78.0 - 96.0 fL Final        No change in pmhx, psh, rads since admission refer to inital eval for detail.  Subjective:   Patient Goal: to go to acute rehab  Pain Assessment  Pain Assessment: Numeric Scale (0-10)  Pain Score: 2-mild pain (LE  L>R)  Pain Intervention(s): Medication (See eMAR);Repositioned;Ambulation/increased  activity;Rest;Relaxation technique;Elevated    Patient's medical condition is appropriate for Physical Therapy intervention at this time.  Patient is agreeable to participation in the therapy session. Nursing clears patient for therapy.  Objective:   Observation of Patient:  Patient is in bed with dressings, peripheral IV, and female external catheter in place.    Inspection/Posture: semisupine in bed soiled, incisions  intact  Cognition/Neuro Status  Behavior: attentive;cooperative;calm  Orientation Level: Return to [facility]       Functional Mobility:  Supine to Sit: to Right;Minimal Assist;Increased Time;Increased Effort;using bedrail;HOB raised (for L LE)  Scooting to EOB: Supervision  Sit to Stand: Minimal Assist;Increased Time;Increased Effort;with instruction for hand placement to increase safety  Stand to Sit: Designer, television/film set Transfers: Stand by Assist  Device Used for Functional Transfer: front-wheeled walker    Balance:  Sitting - Static: Good  Standing - Static:  (initial increased shaking/unsteadiness req progressed to stable w/ AD)       Ambulation:   PMP - Progressive Mobility Protocol   PMP Activity: Step 6 - Walks in Room  Distance Walked (ft) (Step 6,7): 4 Feet (x3)  Ambulation: with front-wheeled walker;Minimal Assist  Pattern: L decreased stance time;R foot decreased clearance;L foot decreased clearance;decreased cadence;decreased step length;Step through  Stair Management: not attempted             Participation and Activity Tolerance:  Participation Effort: good  Endurance: Tolerates 30 min exercise with multiple rests    Patient left with call bell within reach, all needs met, SCDs off, fall mat down, chair alarm on cleared by RN and all questions answered. RN notified of session outcome and patient response.     Goals:  Goals  Goal Formulation: With patient  Time for Goal Acheivement: 10 visits  Goals: Select goal  Pt Will Go Supine To Sit: with minimal assist  Pt  Will Perform Sit To Supine: Goal met  Pt Will Transfer Bed/Chair: with rolling walker, with contact guard assist, Partly met  Pt Will Ambulate: 31-50 feet, with rolling walker, with contact guard assist    PPE Worn by Therapist: gloves   Tech: N/A  PPE Worn by Patient:  No PPE required/worn   Translator: N/A    Marijean Bravo, PT, DPT, Pager [phone], 10/04/2023, 1:19 PM     Time of Treatment  PT Received On: 10/04/23  Start Time: 1120  Stop Time: 1200  Time Calculation (min): 40 min  PT Cancellation: Visit  PT Visit Cancellation Reason: Patient/caregiver declines therapy at this time (deferring until 11AM)  PT Cancellation Minutes: 5  Treatment # 8  out of 10 visits

## 2023-10-04 NOTE — Progress Notes (Addendum)
CASE MANAGEMENT-  Peer-To-Peer Request   Insurance Authorization   Patient Name: Laura Mclaughlin, Laura Mclaughlin   Date of Birth 06-13-1961   Attending Physician: Gregery Na, MD   Primary Care Physician: Regis Bill, DO   Length of Stay 11   Expected Discharge Date:    Admission DX:   1. Closed fracture of distal end of left femur, unspecified fracture morphology, initial encounter       Quick Assessment   CM Comments:  Insurance authorization DELAY since 09/26/23 with [employer].  CM escalated case with the management team and the AR about the delay.  CM received the following response back today :  "This case was assigned to an incorrect region by someone at [employer] intake.   Laura Mclaughlin is [employer] and they assigned it to [place] region.   They reviewed it on the 24th but the lady assigned - left it in her queue and did not send to the medial director until a nurse clearing chart saw it today.  They are denying the request unless a P2P is done.  [phone]  option  3 by 430 pm today."  Member case # [id number].    CM alerted the attending to call and complete the P2P.  CM also sent referrals to SNF, just in case P2P is denied.  CM sent list of choices to the patient's email to review.  CM will f/up with the patient.    HCA: Loraine Leriche Schweer/ Spouse/ [phone]        DISPO: AR/ [facility] in [place]/ [address] [place], [place]  [address]  Current estimated discharge date on file: TBD   Barriers to discharge:  Need Insurance Authorization for payment    Updates: Patient will discharge after all milestones have been met.     Acute Rehab Recommendations   Discharge Recommendations: Acute Rehab   D/C Milestones: n/a     If Acute Rehab recommended discharge disposition is not available, pt will need HHA for increased HOA for functional mobility/ADLs     DME needs IF patient is discharging home Front wheel walker, Wheelchair-manual for mobility, refer to OT eval/notes for ADL  equipment likely [facility]     Therapy discharge recommendations may change with patient status.  Please refer to most recent note for up-to-date recommendations.     Patient anticipated to benefit from and to be able to engage in 3 hours of therapy a day for 5 days a week.       CM continues to follow for care coordination.       Sharion Dove  [facility] Case Manager  [phone]

## 2023-10-04 NOTE — Consults (Signed)
Start [facility] Note  Home Health Referral    Referral from (Case Manager) for home health care upon discharge.    By [employer], the patient has the right to freely choose a home care provider.    A company of the patients choosing. We have supplied the patient with a listing of providers in your area who asked to be included and participate in Medicare.   [facility] a home care agency that provides adult home care services and participates in Medicare   The preferred provider of your insurance company. Choosing a home care provider other than your insurance company's preferred provider may affect your insurance coverage.      Home Health Discharge Information    Your doctor has ordered Skilled Nursing, Physical Therapy, and Occupational Therapy in-home service(s) for you while you recuperate at home, to assist you in the transition from hospital to home.    The agency that you or your representative chose to provide the service:  [facility] [place], [place] [phone]    [employer]:  [facility]  Phone: [phone]; [phone]  Equipment Ordered: Dan Humphreys      The above services were set up by:  Oda Kilts, RN (Home Health Liaison)   Phone: [phone]      IF YOU HAVE NOT HEARD FROM YOUR HOME HEALTH AGENCY WITHIN 24-48 HOURS AFTER DISCHARGE PLEASE CALL YOUR AGENCY TO ARRANGE A TIME FOR YOUR FIRST VISIT. FOR ANY SCHEDULING CONCERNS OR QUESTIONS RELATED TO HOME HEALTH, SUCH AS TIME OR DATE PLEASE CONTACT YOUR HOME HEALTH AGENCY AT THE NUMBER LISTED ABOVE.    Additional comments: demographics verified. Agency         START PATIENT REGISTRATION INFORMATION     Order Information  Order Signing Physician: Gregery Na, MD    Service Ordered RN ?: Yes  Service Ordered PT ?: Yes  Service Ordered OT ?: Yes  Service Ordered ST ?: No    Service Ordered MSW?: No    Service Ordered HHA?: No    Following Physician: Regis Bill,  DO   Following Physician Phone: [phone]   Overseeing Physician: N/A  (Required for Residents only)   Agreeable to Follow?: N/A  Spoke with: N/A  Date/Time of Call: 10/04/23 5:10 PM      Care Coordination   SOC Call from [facility] Required?: no  Same Day [facility]?: no  Primary Care Physician:Roy Welton Flakes, DO  Primary Care Physician Phone:[phone]  Primary Care Physician Address: [address] / [place] [address]  PCP NPI: [id number]  Visit Instructions: N/A  Service Discharge Location Type: Home  Service Facility Name: N/A  Service Floor Facility: N/A  Service Room No: N/A    Demographics  Patient Last Name: Xiao   Patient First Name: Laura Mclaughlin  Language/Communication Barrier: no  Service Address: [address]  [place] [address]   Service Home Phone: [phone] (home)   Other phone numbers:    Telephone Information:   Mobile [phone]     Emergency Contact: Extended Emergency Contact Information  Primary Emergency Contact: [name],[name]  Mobile Phone: [phone]  Relation: Spouse  Secondary Emergency Contact: Chambers,[name]  Mobile Phone: [phone]  Relation: Daughter    Admission Information  Admit Date: 09/23/2023  Patient Status at discharge: Inpatient  Admitting Diagnosis: Closed fracture of distal end of left femur, unspecified fracture morphology, initial encounter [S72.402A]     Caregiver Information  Caregiver First  Name: N/A  Caregiver Last Name: N/A  Caregiver Relationship to Patient:  n/a  Caregiver Phone Number: N/A  Caregiver Notes: N/A            Data processing manager Information  Primary Subscriber:   Primary Subscriber Relation To Guarantor:   Primary Payor:   Primary Plan:   Primary Group #:    Estate agent ID:    Estate agent DOB:   Secondary Insurance Information  Secondary Subscriber:   Secondary Subscriber Relation To Guarantor:   Secondary Payor:   Secondary Plan:   Secondary Group #:   Secondary Subscriber ID:   Secondary Subscriber  DOB:   HITECH  NO      END PATIENT REGISTRATION INFORMATION       Diagnosis: Closed fracture of distal end of left femur, unspecified fracture morphology, initial encounter [S72.402A]    Start [facility] Summary        Additional Comments:     End [facility] Summary     Discharge Date:  10/04/2023     Referral Source  Signed by: Oda Kilts, RN  Date Time: 10/04/23 5:10 PM      End [facility] Note         Home Health face-to-face (FTF) Encounter (Order [id number])  Consult  Date: 10/04/2023 Department: [facility] Ordering/Authorizing: Jorja Loa, DPM     Order Information    Order Date/Time Release Date/Time Start Date/Time End Date/Time   10/04/23 05:33 PM None 10/04/23 05:32 PM 10/04/23 05:32 PM     Order Details    Frequency Duration Priority Order Class   Once 1  occurrence Routine Hospital Performed     Standing Order Information    Remaining Occurrences Interval      1/1 Once               Provider Information    Ordering User Ordering Provider Authorizing Provider   Oda Kilts, RN Anjum, Royce Macadamia, DPM Jorja Loa, DPM   Attending Provider(s) Admitting Provider PCP   Gregery Na, MD Gregery Na, MD Regis Bill, DO     Verbal Order Info    Action Created on Order Mode Entered by Responsible Provider Signed by Signed on   Ordering 10/04/23 1733 Telephone with Chevis Pretty, RN Jorja Loa, DPM       Cosign Order Info    Action Created on Responsible Provider Signed by Signed on   Ordering 10/04/23 1733 Jorja Loa, DPM             Comments    Closed fracture of left distal femur S72.402A 06/09/2022 Unknown  [name], Laura Chad, DO  Home nursing required for skilled assessment including cardiopulmonary assessment and dietary education for disease management, and medication instruction. Home PT/OT required for gait and balance training, strengthening, mobility, fall prevention, and ADL training. HHA needed for ADL care as patient transitions back to home after  recent hospital inpatient stay.       Rolling walker needed >99 months, NPI: [id number]  Ht 1.753 m (5' 9.02")  Wt 108.4 kg (238 lb 15.7 oz)                Home Health face-to-face (FTF) Encounter: Patient Communication     Not Released  Not seen         Order Questions    Question Answer   Date I saw the patient face-to-face: 10/04/2023   Evidence  this patient is homebound because: C.  Decreased endurance, strength, ROM, cadence, safety/judgment during mobility   Medical conditions that necessitate Home Health care: C.  Risk for complication/infection/pain requiring follow up and monitoring   Per clinical findings, following services are medically necessary: Skilled Nursing    PT    OT   Clinical findings that support the need for Skilled Nursing. SN will: A. Educate on post operative care, restrictions & management of complications    B. Monitor post operative site for infection and educate on care management    C. Monitor for signs and symptoms of exacerbation of disease and management    D. Review medication reconciliation, manage and educate on use and side effects    G. Educate on new diagnosis, treatment & management to prevent re-hospitalization    H. Assess cardiopulmonary status and monitor for signs &symptoms of exacerbation    I.  Educate dietary and or fluid restrictions and weight management   Clinical findings that support the need for Physical Therapy. PT will A.  Evaluate and treat functional impairment and improve mobility    B.  Educate on post-surgical care, restrictions and management of complications    C.  Educate on weight bearing status, stair/gait training, balance & coordination    D.  Provide services to help restore function, mobility, and releive pain    E.  Educate on functional mobility; bed, chair, sit, stand and transfer activities    F.  Perform home safety assessment & develop safe in home exercise program    G.  Implement activities to improve stance time, cadence & step length     I.  Instruct on restorative activities to restore ability to perform ADL    H.  Educate on the safe use of assistive device/ durable medical equipment   OT will provide assistance with: Home program to improve ability to perform ADLs    Recovery and maintenance skills    Basic motor function and reasoning abilities    Strategies to compensate for loss of function    Restorative program to improve mobility and independence                    Process Instructions    Please select Home Care Services medically necessary.    Based on the above findings, I certify that this patient is confined to the home and needs intermittent skilled nursing care, physical therapry and / or speech therapy or continues to need occupational therapy. The patient is under my care, and I have initiated the establishment of the plan of care. This patient will be followed by a physician who will periodically review the plan of care.     Collection Information            Consult Order Info    ID Description Priority Start Date Start Time   [id number] Home Health face-to-face (FTF) Encounter Routine 10/04/2023  5:32 PM   Provider Specialty Referred to   ______________________________________ _____________________________________                         Verbal Order Info    Action Created on Order Mode Entered by Responsible Provider Signed by Signed on   Ordering 10/04/23 1733 Telephone with Chevis Pretty, RN Anjum, Royce Macadamia, DPM       Cosign Order Info    Action Created on Responsible Provider Signed by Signed on   Ordering 10/04/23 1733  Jorja Loa, DPM             Patient Information    Patient Name  Laura Mclaughlin, Laura Mclaughlin Legal Sex  Female DOB  [birth year]       Reprint Order Requisition    Home Health face-to-face (FTF) Encounter (Order #[id number]) on 10/04/23       Additional Information    Associated Reports External References   Priority and Order Details [employer]

## 2023-10-04 NOTE — PT Progress Note (Signed)
Physical Therapy Note    [facility]   Physical Therapy Cancellation Note      Patient:  Laura Mclaughlin MRN#:  [id number]  Unit:  [facility] Room/Bed:  J191/Y782.95    10/04/2023  Time: 846am      PT Cancellation: Visit  PT Visit Cancellation Reason: Patient/caregiver declines therapy at this time (deferring until 11AM)     Will continue to monitor & return as schedule permits.    Marijean Bravo, PT, DPT, Pager [phone], 10/04/2023, 8:47 AM

## 2023-10-04 NOTE — Progress Notes (Addendum)
DISCHARGE PLANNING NOTE -  P2P Denied    Dr. Para March, physician advisor completed the P2P, which is DENIED.  Insurance will authorize SNF - patient DECLINED SNF.    DCP: Patient will discharge today and will need   -PT/OT/SN - [place] area  -Patient declined DME    Referral sent to [facility]/ Oda Kilts, RN to complete home setup.      CM will discuss SNF options with the patient vs Home with HH.      DISPO:  Pending SNF vs Home              Sharion Dove, MSW  [facility]- Case Manager  [facility]  [address] [place], [place]  [address]  [email or web address]  p) [phone]

## 2023-10-04 NOTE — OT Progress Note (Signed)
Occupational Therapy Treatment  Laura Mclaughlin        Post Acute Care Therapy Recommendations:     Discharge Recommendations:  Acute Rehab    If Acute Rehab  recommended discharge disposition is not available, patient will need hands on assist for ADLs and functional mobility tasks and HHOT.     DME needs IF patient is discharging home: Front wheel walker, Wheelchair-manual, [facility]    Therapy discharge recommendations may change with patient status.  Please refer to most recent note for up-to-date recommendations.    Patient anticipated to benefit from and to be able to engage in 3 hours of therapy a day for 5 days a week.      Assessment:   Significant Findings: none    Pt found in bedside chair, agreeable to therapy. Pt performs sit to stands to RW with min a and able to ambulate to/from toilet with min a and RW and cues for safety and technique as well as increased time for performance due to pain and overall decreased strength and endurance. Pt able to simulate bathing and dressing with min a and educated on energy conservation, proper body mechanics, home safety, and fall prevention technique. Pt participated in dynamic discussion and verbalized understanding. Activities completed to increase independence and safety with ADLs and functional mobility tasks.    Assessment  Assessment: decreased ROM;decreased strength;balance deficits;decreased independence with ADLs;decreased endurance/activity tolerance  Prognosis: Good;With continued OT s/p acute discharge;With family  Progress: Progressing toward goals  Patient left without needs and call bell within reach. RN notified of session outcome.       Treatment Activities: ADL Training, Functional Mobility Training    Educated the patient to role of occupational therapy, plan of care, goals of therapy and safety with mobility and ADLs, energy conservation techniques, discharge instructions, home safety.    Plan:   OT Frequency Recommended: 4-5x/wk     OT  Plan  Risks/Benefits/POC Discussed with Pt/Family: With patient/family  Treatment Interventions: ADL retraining, Endurance training, Compensatory technique education, Patient/Family training, Functional transfer training  Discharge Recommendation: Acute Rehab  DME Recommended for Discharge: Front wheel walker, Wheelchair-manual, [facility]  OT Frequency Recommended: 4-5x/wk  OT - Next Visit Recommendation: 10/07/23    Continue plan of care.    Unit: [facility]  Bed: W098/J191.47       Precautions and Contraindications:   Precautions  Weight Bearing Status: LLE WBAT, RLE WBAT  Precaution Instructions Given to Patient: Yes  Fall Risks: History of fall(s), High, Impaired balance/gait, Impaired mobility, Muscle weakness  Other Precautions: falls    Updated Medical Status/Imaging/Labs:  Reviewed    Subjective: "That's a good tip about adding a grocery bag to the walker"       Patient's medical condition is appropriate for Occupational Therapy intervention at this time.  Patient is agreeable to participation in the therapy session. Nursing clears patient for therapy.    Pain Assessment  Pain Assessment: Numeric Scale (0-10)  Pain Score: 4-moderate pain  POSS Score: Awake and Alert  Pain Location: Leg  Pain Orientation: Left  Pain Descriptors: Discomfort;Aching  Pain Frequency: Constant/continuous  Pain Intervention(s): Repositioned;Rest   .         Objective:   Observation of Patient/Vital Signs:  Patient is in bed with peripheral IV and female external catheter in place.  Pt wore mask during therapy session:No      Cognition/Neuro Status  Arousal/Alertness: Appropriate responses to stimuli  Attention Span: Appears intact  Orientation Level: Oriented X4  Memory: Appears intact  Following Commands: Follows all commands and directions without difficulty  Safety Awareness: minimal verbal instruction  Insights: Educated in Engineer, building services  Problem Solving: supervision  Behavior: attentive;cooperative;calm  Motor  Planning: intact  Coordination: intact    Functional Mobility  Sit to Stand Transfers: Minimal Assist  Stand to Sit Transfers: Minimal Assist  Bed to Bedside Commode Transfers: Minimal Assist  Bed to Chair Transfers: Minimal Assist  Functional Mobility/Ambulation: Minimal Assist (RW)    Self-care and Home Management  Eating: Independent;in a chair  Grooming: Contact Guard Assist;standing at sink  Bathing: Minimal Assist  UB Dressing: Supervision  LB Dressing: Minimal Assist  Toileting: Minimal Assist  Functional Transfers: Minimal Assist (RW)                        PMP Activity: Step 6 - Walks in Room               Participation: good  Endurance: good      Patient left with call bell within reach, all needs met, SCDs not on as found, fall mat in place, bed alarm n/a, chair alarm on and all questions answered. RN notified of session outcome and patient response.       Goals:    Time For Goal Achievement: 5 visits  ADL Goals  Patient will groom self: Supervision  Patient will dress lower body: Supervision  Patient will toilet: Supervision  Mobility and Transfer Goals  Pt will perform functional transfers: Supervision                           PPE worn during session: procedural mask and gloves        Matilde Sprang, OTD, OTR/L     Time of Treatment:   OT Received On: 10/04/23  Start Time: 1400  Stop Time: 1440  Time Calculation (min): 40 min    Treatment # 2 of 5 visits

## 2023-10-04 NOTE — Nursing Progress Note (Signed)
Patient given a list of hotels nearby provided by CM to accommodate her until her f/u appointment. Conditional discharge order is in place, to be released pending her selection.

## 2023-10-05 NOTE — Nursing Progress Note (Signed)
Pt discharged to home with [facility]. Discharge instructions given to patient and husband at bedside,verbalizes understanding. goals upon discharge. Copy of AVS provided to patient and husband as well. Prescribed meds delivered by pharmacy to pt's room. IV removed with catheter intact Discharge orders complete. Pt discharged with all personal belongings with none left in drawers or room upon discharge. Patient's home medication picked up from med drawer and provided to patient. Pt transported out of unit via volunteer wheelchair transport.Transferred to truck with 4 people .  Vitals:    10/05/23 0547 10/05/23 0723 10/05/23 1136 10/05/23 1224   BP: 104/58 93/55 (!) 89/55 112/77   Pulse: 97 97 88    Resp: 17 17 17     Temp: 98.2 F (36.8 C) 97.7 F (36.5 C) 98.8 F (37.1 C)    TempSrc: Oral Oral Oral    SpO2: 94% 94% 91%    Weight:       Height:

## 2023-10-05 NOTE — Plan of Care (Signed)
Medicine B Progress/Shift Note:    Events during the shift:  Took shower during this shift.  Neuro: A&Ox4,   Cardiac/Tele: No tele   Pulm: RA   Skin:Surgical incision   GI/GU/Last BM: Continent X2, BM X1 during this shift   Mobility/MSK:2 person max assist   Active Lines/IV Access:PIV X1, SL   Drains: n/a  Pain: Managed with PRN and schedule meds   Safety: Purposeful rounding. Bed on low position. Floor mat at bedside. Bed alarm active. 3/4 side rails. Nonskid socks on. Call bell within pt's reach. Safety precautions enforced.  Discharge Plan: Today   Social/Family Visits: Husband at bedside.    Vitals:    10/05/23 0723   BP: 93/55   Pulse: 97   Resp: 17   Temp: 97.7 F (36.5 C)   SpO2: 94%              Problem: Pain interferes with ability to perform ADL  Goal: Pain at adequate level as identified by patient  Outcome: Progressing     Problem: Side Effects from Pain Analgesia  Goal: Patient will experience minimal side effects of analgesic therapy  Outcome: Progressing     Problem: Moderate/High Fall Risk Score >5  Goal: Patient will remain free of falls  Outcome: Progressing     Problem: Compromised Sensory Perception  Goal: Sensory Perception Interventions  Outcome: Progressing     Problem: Compromised Activity/Mobility  Goal: Activity/Mobility Interventions  Outcome: Progressing     Problem: Compromised Nutrition  Goal: Nutrition Interventions  Outcome: Progressing     Problem: Compromised Friction/Shear  Goal: Friction and Shear Interventions  Outcome: Progressing     Problem: Safety  Goal: Patient will be free from injury during hospitalization  Outcome: Progressing  Goal: Patient will be free from infection during hospitalization  Outcome: Progressing     Problem: Pain  Goal: Pain at adequate level as identified by patient  Outcome: Progressing     Problem: Discharge Barriers  Goal: Patient will be discharged home or other facility with appropriate resources  Outcome: Progressing     Problem: Psychosocial and  Spiritual Needs  Goal: Demonstrates ability to cope with hospitalization/illness  Outcome: Progressing     Problem: Impaired Mobility  Goal: Mobility/Activity is maintained at optimal level for patient  Outcome: Progressing     Problem: Peripheral Neurovascular Impairment  Goal: Extremity color, movement, sensation are maintained or improved  Outcome: Progressing     Problem: Compromised skin integrity  Goal: Skin integrity is maintained or improved  Outcome: Progressing     Problem: Nutrition  Goal: Nutritional intake is adequate  Outcome: Progressing     Problem: Compromised Moisture  Goal: Moisture level Interventions  Outcome: Progressing

## 2023-10-05 NOTE — PT Progress Note (Signed)
Physical Therapy Note    Physical Therapy Treatment Patient:  Laura Mclaughlin  Post Acute Care Therapy Recommendations:   Discharge Recommendations: Acute Rehab   D/C Milestones: n/a    If Acute Rehab recommended discharge disposition is not available, pt will need HHA for increased HOA for functional mobility/ADLs    DME needs IF patient is discharging home Front wheel walker, Wheelchair-manual for mobility, refer to OT eval/notes for ADL equipment    Therapy discharge recommendations may change with patient status.  Please refer to most recent note for up-to-date recommendations.    Patient anticipated to benefit from and to be able to engage in 3 hours of therapy a day for 5 days a week.    Assessment:   Significant Findings: n/a    Pt presents alert & motivated to participate in mobility, however increased drowsiness at EOB requiring cues for eyes open throughout. Pt reports pain tolerable however decreased stance time noted on L LE during amb. Pt requires increased assist this session for STS & controlled descent onto shower chair at end of session. Minor unsteadiness during amb requiring HOA particularly w/ slope of shower floor. Improved steps backward w/ assist for navigation of AD. PT advised RN & tech of chair at entrance to bathroom door & BSC/chair follow return to bed if needed d/t increased fatigue noted today. Pt would continue to benefit from skilled physical therapy to increase strength and endurance as well as improve transfers and balance for improved functional mobility.         Vitals: stable    Assessment: Decreased UE strength, Decreased LE strength, Decreased endurance/activity tolerance, Decreased functional mobility, Decreased balance, Gait impairment  Progress: Progressing toward goals  Prognosis: Good, With continued PT status post acute discharge, With home health nursing/aide  Risks/Benefits/POC Discussed with Pt/Family: With patient/family    Treatment Activities:  bed  mobility/transfer training, sitting/standing tolerance & endurance training, NMR/balance training, AD Eval/training, gait training, ther ex    Educated the patient to role of physical therapy, plan of care, goals of therapy and safety with mobility and ADLs, energy conservation techniques, discharge instructions, home safety.  Plan:   Treatment/Interventions: Gait training, Exercise, Functional transfer training, LE strengthening/ROM, Endurance training, Equipment eval/education, Bed mobility      PT Frequency: 4-5x/wk   Continue plan of care.    Verne Carrow [facility] [phone]     Precautions and Contraindications:   Precautions  Weight Bearing Status: LLE WBAT;RLE WBAT  Precaution Instructions Given to Patient: Yes  Fall Risks: Impaired balance/gait;History of fall(s);High;Impaired mobility;Muscle weakness;Orthopedic patient  Updated Medical Status/Imaging/Labs:  WBC   Date Value Ref Range Status   09/26/2023 6.69 3.10 - 9.50 x10 3/uL Final   09/08/2022 7.22 3.10 - 9.50 x10 3/uL Final   08/07/2022 11.1 (H) 4.0 - 11.0 K/cmm Final     RBC   Date Value Ref Range Status   09/26/2023 2.68 (L) 3.90 - 5.10 x10 6/uL Final   09/08/2022 2.31 (L) 3.90 - 5.10 x10 6/uL Final   08/07/2022 3.97 3.80 - 5.00 M/cmm Final     Hemoglobin   Date Value Ref Range Status   09/26/2023 7.9 (L) 11.4 - 14.8 g/dL Final   86/57/8469 62.9 12.0 - 16.0 gm/dL Final     Hgb   Date Value Ref Range Status   09/08/2022 6.9 (L) 11.4 - 14.8 g/dL Final     Hematocrit   Date Value Ref Range Status   09/26/2023 25.3 (L) 34.7 -  43.7 % Final   09/08/2022 21.9 (L) 34.7 - 43.7 % Final     MCV   Date Value Ref Range Status   09/26/2023 94.4 78.0 - 96.0 fL Final   09/08/2022 94.8 78.0 - 96.0 fL Final        No change in pmhx, psh, rads since admission refer to inital eval for detail.  Subjective:   Patient Goal: to go home instead of SNF if cant go to rehab  Pain Assessment  Pain Assessment: Numeric Scale (0-10)  Pain Score: 3-mild pain  Pain  Location: Leg (L>R)  Pain Intervention(s): Repositioned;Ambulation/increased activity;Rest;Relaxation technique    Patient's medical condition is appropriate for Physical Therapy intervention at this time.  Patient is agreeable to participation in the therapy session. Nursing clears patient for therapy.  Objective:   Observation of Patient:  Patient is in bed with dressings, peripheral IV, and female external catheter in place.    Inspection/Posture: semisupine in bed w/ ex cath,  Cognition/Neuro Status  Behavior: cooperative (drowsy)          Functional Mobility:  Rolling: to Right;Stand by Assist  Supine to Sit: to Right;HOB raised;using bedrail;Increased Time;Increased Effort;Minimal Assist  Scooting to EOB: Contact Guard Assist  Sit to Stand: Minimal Assist;Increased Time;Increased Effort;with instruction for hand placement to increase safety;bed elevated  Stand to Sit: Minimal Assist (to control descent)  Transfers  Posterior Scoot Transfers: Stand by Assist  Device Used for Functional Transfer: front-wheeled walker    Balance:  Sitting - Static: Good  Standing - Static: Fair (w/ AD minor unsteadiness)       Ambulation:   PMP - Progressive Mobility Protocol   PMP Activity: Step 6 - Walks in Room  Distance Walked (ft) (Step 6,7): 8 Feet  Ambulation: Minimal Assist;with front-wheeled walker  Pattern: R foot decreased clearance;L foot decreased clearance;L decreased stance time;decreased cadence;decreased step length;Step through  Stair Management: not attempted             Participation and Activity Tolerance:  Participation Effort: good  Endurance: Tolerates 30 min exercise with multiple rests    Patient left with call bell within reach, all needs met, SCDs off, fall mat down, bed alarm up  as pt left in shower w/ RN & tech to assist, all questions answered. RN notified of session outcome and patient response.     Goals:  Goals  Goal Formulation: With patient  Time for Goal Acheivement: 10 visits  Goals: Select  goal  Pt Will Go Supine To Sit: with minimal assist  Pt Will Perform Sit To Supine: Goal met  Pt Will Transfer Bed/Chair: with rolling walker, with contact guard assist, Partly met  Pt Will Ambulate: 31-50 feet, with rolling walker, with contact guard assist    PPE Worn by Therapist: gloves   Tech: floor tech & RN  PPE Worn by Patient:  No PPE required/worn   Translator: N/A    Marijean Bravo, PT, DPT, Pager [phone], 10/05/2023, 10:03 AM     Time of Treatment  PT Received On: 10/05/23  Start Time: 0932  Stop Time: 3557  Time Calculation (min): 25 min  Treatment # 9  out of 10 visits

## 2023-10-05 NOTE — Plan of Care (Signed)
Nursing Progress Note:    Neuro: ao4  GIGU: ext cath, lbm: 11/28, -n/-v  Diet: Reg   Cardiac: no tele, denies chest pain, no shortness of breath  Pulm: RA, VSS, no cpo  Skin: surgical incision(s) on b/l knees -- 1 (R knee) 3 (L knee)  Pain: denies pain    Mobility: x1 assist w/ walker   Safety: Pt is a fall risk. Fall precautions in place: bed in lowest position, fall mat in place, bed alarm on, call light is within reach.    Active lines : CDI + flushed   Patient Lines/Drains/Airways Status       Active Lines, Drains and Airways       Name Placement date Placement time Site Days    Peripheral IV 09/29/23 22 G Standard Right Antecubital 09/29/23  1500  Antecubital  5    External Urinary Catheter 09/25/23  0700  --  9                    Vitals:    10/04/23 0355 10/04/23 0838 10/04/23 1848 10/05/23 0003   BP: 91/64 116/65 91/58 118/72   Pulse: 65 71 77 89   Resp: 18  17 16    Temp: 97.3 F (36.3 C) 97.5 F (36.4 C) 97.3 F (36.3 C) 98.6 F (37 C)   TempSrc: Oral Oral Oral Oral   SpO2: 93% 97% 95% (!) 86%   Weight:       Height:             Intake/Output Summary (Last 24 hours) at 10/05/2023 0244  Last data filed at 10/04/2023 1330  Gross per 24 hour   Intake 1150 ml   Output 800 ml   Net 350 ml     Problem: Pain interferes with ability to perform ADL  Goal: Pain at adequate level as identified by patient  10/05/2023 0243 by Lesle Chris, RN  Outcome: Progressing  Flowsheets (Taken 10/04/2023 1151 by Loura Pardon, RN)  Pain at adequate level as identified by patient:   Identify patient comfort function goal   Assess for risk of opioid induced respiratory depression, including snoring/sleep apnea. Alert healthcare team of risk factors identified.   Assess pain on admission, during daily assessment and/or before any "as needed" intervention(s)   Evaluate if patient comfort function goal is met   Reassess pain within 30-60 minutes of any procedure/intervention, per Pain Assessment, Intervention, Reassessment (AIR)  Cycle   Evaluate patient's satisfaction with pain management progress   Offer non-pharmacological pain management interventions   Include patient/patient care companion in decisions related to pain management as needed  10/05/2023 0233 by Lesle Chris, RN  Outcome: Progressing     Problem: Side Effects from Pain Analgesia  Goal: Patient will experience minimal side effects of analgesic therapy  10/05/2023 0243 by Lesle Chris, RN  Outcome: Progressing  Flowsheets (Taken 09/30/2023 2036 by Marcille Blanco, RN)  Patient will experience minimal side effects of analgesic therapy:   Monitor/assess patient's respiratory status (RR depth, effort, breath sounds)   Assess for changes in cognitive function   Prevent/manage side effects per LIP orders (i.e. nausea, vomiting, pruritus, constipation, urinary retention, etc.)   Evaluate for opioid-induced sedation with appropriate assessment tool (i.e. POSS)  10/05/2023 0233 by Lesle Chris, RN  Outcome: Progressing     Problem: Compromised Friction/Shear  Goal: Friction and Shear Interventions  10/05/2023 0243 by Lesle Chris, RN  Outcome: Progressing  Flowsheets (Taken 10/04/2023 1920)  Friction and  Shear Interventions: Pad bony prominences, Off load heels, HOB 30 degrees or less unless contraindicated, Consider: TAP seated positioning, Heel foams  10/05/2023 0233 by Lesle Chris, RN  Outcome: Progressing

## 2023-10-07 NOTE — UM Notes (Signed)
Requesting final auth to be faxed to [phone] or call [phone]        Auth Number :  [id number]    Discharge Date -  10/05/2023  2:20 PM    ER ADMIT DATE AND TIME: 09/23/2023  7:58 AM  OBS ADMIT DATE: 09/23/2023  7:58 AM  IP ADMIT DATE: 09/23/2023  7:58 AM                NAME: [name]             MR#: [id number]      Patient Address:  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      DIAGNOSIS:     ICD-10-CM    1. Closed fracture of distal end of left femur, unspecified fracture morphology, initial encounter  S72.402A           Information sent by a UR Department Case Management Assistant

## 2023-10-07 NOTE — Progress Notes (Signed)
Name: Laura Mclaughlin    ### Patient Details  Date of Birth: [birth year]  MRN: [id number]    ### Encounter Details  Arrival Date: 09/23/2023 07:58 AM EST  Discharge Date: 10/05/2023 02:20 PM EST  Encounter ID: [id number] TCM11/30/2024   2:20:00PM    ### Related interaction  [place] - TCM V2 Post Discharge Outreach (Post Discharge TCM V2 Outreach 1) ([email or web address] [phone])    ### Questions     Question 1   Follow-Up Scheduled 2   Do you need help scheduling a follow up appointment? Please press 1 for no help needed, press 2 for I need help.   Need Help (Issue Panel: Post Discharge - TCM)    ### Required Interventions and Feedback     Call Status         Comments::     Reached pt. at [phone] (edited by NG on 10/07/2023 11:52 AM EST)     Obtaining Rx - Action List         No Interventions Needed:     Yes (edited by NG on 10/07/2023 11:56 AM EST)     Follow-Up Appointment  Help - Actions Taken         Provided Appt Information:     Yes (edited by NG on 10/07/2023 11:57 AM EST)    Comments:     10/11/23- Ortho (edited by NG on 10/07/2023 11:57 AM EST)     General Status          General Status - Issues:     Related to original diagnosis (edited by NG on 10/07/2023 11:52 AM EST)    Comments:     Pt. was Hospitalized for a fractured left femur and repair w/graft. Pt. stated she is doing okay, but it's difficult. pt. is using a walker and pain is controlled.  (edited by NG on 10/07/2023 11:53 AM EST)     General Status - Actions Taken         Suggested calling PCP:     Yes (edited by NG on 10/07/2023 11:53 AM EST)    Other (Add Details in Comments):     Yes (edited by NG on 10/07/2023 11:53 AM EST)    Comments:     Call PCP or Specialist to report symptoms and follow guidance directions. (edited by NG on 10/07/2023 11:53 AM EST)     Home Health - Actions Taken         Other (Add Details in Comments):     Yes (edited by NG on 10/07/2023 11:57 AM EST)    Comments:     [facility] to  provide SN/PT/OT (edited by NG on 10/07/2023 11:57 AM EST)     Discharge Instruction - Actions Taken         Reviewed Discharge Instructions:     Yes (edited by NG on 10/07/2023 11:53 AM EST)    Reviewed What To Do if Problem Arises:     Yes (edited by NG on 10/07/2023 11:53 AM EST)    Comments:     Discharge AVS Review completed. (edited by NG on 10/07/2023 11:54 AM EST)     Additional Questions         Comments:     Pt. is ambulating with a walker. Pt. can call CM at any time with questions. (edited by NG on 10/07/2023 11:58 AM EST)     Medication Questions - Actions List         Answered Medication Questions:  Yes (edited by NG on 10/07/2023 11:56 AM EST)    Discussed Purpose of Medications:     Yes (edited by NG on 10/07/2023 11:56 AM EST)    Comments::     Medication Reconciliation completed. (edited by NG on 10/07/2023 11:56 AM EST)    Julieanne Cotton, MS, [facility]  Pronouns She/Her  Ambulatory Case Manager    [facility]  [address]  [place], [place] [address]  [place] [phone]  [email or web address]

## 2023-10-11 ENCOUNTER — Encounter (INDEPENDENT_AMBULATORY_CARE_PROVIDER_SITE_OTHER): Payer: Self-pay | Admitting: Student in an Organized Health Care Education/Training Program

## 2023-10-11 ENCOUNTER — Ambulatory Visit (INDEPENDENT_AMBULATORY_CARE_PROVIDER_SITE_OTHER): Payer: Medicare (Managed Care) | Admitting: Student in an Organized Health Care Education/Training Program

## 2023-10-11 VITALS — BP 116/63 | HR 78 | Ht 69.02 in | Wt 239.0 lb

## 2023-10-11 DIAGNOSIS — M80052K Age-related osteoporosis with current pathological fracture, left femur, subsequent encounter for fracture with nonunion: Secondary | ICD-10-CM

## 2023-10-11 NOTE — Progress Notes (Signed)
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  She is accompanied by her husband.  She feels well overall pain is well-controlled and continues to improve.  She has been doing physical therapy.  She has been weightbearing as tolerated and continues to make progress with regards to that.  She has minimal pain with regards to her right thigh and knee.  She overall reports that her left leg feels better and more stable following this surgery and she is happy with her progress so far.  No other complaints.    Social History[1]    PE:   Vitals:    10/11/23 1308   BP: 116/63   Pulse: 78   SpO2: 95%     Right lower extremity:  Wound healing well staples removed.  No discharge and no evidence of infection.  +DF/PF foot.  Sensation intact light touch dorsal/plantar foot.  + pedal pulse  No calf pain, negative Homan's.    Left lower extremity:  Wounds healing well staples removed.  No discharge and no evidence of infection.  +DF/PF foot.  Sensation intact light touch dorsal/plantar foot.  + pedal pulse  No calf pain, negative Homan's.    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  Overall [name] appears to be doing very well clinically today on my examination.  She is making excellent progress with regards to bilateral lower extremities.  Continue with weightbearing and range of motion as tolerated.  I will plan to see her back in approximately 1 month and we will obtain x-rays of the  left femur at that time. They are understanding and in agreement with the plan as discussed. All questions answered.     Xrays next visit: AP and lateral x-rays of the left femur    This note was generated within the EPIC EMR using Dragon medical speech recognition software and may contain inherent errors or omissions not intended by the user. Grammatical and punctuation errors, random word insertions, deletions, pronoun errors and incomplete sentences are occasional consequences of this technology due to software limitations. Not all errors are caught or corrected.  Although every attempt is made to root out erroneus and incomplete transcription, the note may still not fully represent the intent or opinion of the author. If there are questions or concerns about the content of this note or information contained within the body of this dictation they should be addressed directly with the author for clarification.         [1]   Social History  Tobacco Use    Smoking status: Never    Smokeless tobacco: Never   Vaping Use    Vaping status: Never Used   Substance Use Topics    Alcohol use: Never    Drug use: Never

## 2023-10-21 ENCOUNTER — Other Ambulatory Visit (INDEPENDENT_AMBULATORY_CARE_PROVIDER_SITE_OTHER): Payer: Self-pay | Admitting: INTERNAL MEDICINE

## 2023-10-22 LAB — LAB USE ONLY - CULTURE, FUNGAL
Culture Fungal: NO GROWTH
Culture Fungal: NO GROWTH

## 2023-10-23 ENCOUNTER — Encounter (INDEPENDENT_AMBULATORY_CARE_PROVIDER_SITE_OTHER): Payer: Self-pay | Admitting: INTERNAL MEDICINE

## 2023-11-05 ENCOUNTER — Encounter (INDEPENDENT_AMBULATORY_CARE_PROVIDER_SITE_OTHER): Payer: Self-pay | Admitting: INTERNAL MEDICINE

## 2023-11-05 LAB — LAB USE ONLY - CULTURE, ACID FAST BACILLI (AFB/MYCOBACTERIA)
Culture Acid Fast Bacillus (AFB): NO GROWTH
Culture Acid Fast Bacillus (AFB): NO GROWTH

## 2023-11-22 ENCOUNTER — Ambulatory Visit (INDEPENDENT_AMBULATORY_CARE_PROVIDER_SITE_OTHER): Payer: Medicare (Managed Care) | Admitting: Student in an Organized Health Care Education/Training Program

## 2023-12-04 ENCOUNTER — Encounter (HOSPITAL_BASED_OUTPATIENT_CLINIC_OR_DEPARTMENT_OTHER): Payer: Self-pay | Admitting: Student in an Organized Health Care Education/Training Program

## 2023-12-13 ENCOUNTER — Encounter (INDEPENDENT_AMBULATORY_CARE_PROVIDER_SITE_OTHER): Payer: Self-pay | Admitting: Student in an Organized Health Care Education/Training Program

## 2023-12-13 ENCOUNTER — Ambulatory Visit (INDEPENDENT_AMBULATORY_CARE_PROVIDER_SITE_OTHER): Payer: Medicare (Managed Care)

## 2023-12-13 ENCOUNTER — Ambulatory Visit (INDEPENDENT_AMBULATORY_CARE_PROVIDER_SITE_OTHER): Payer: Medicare (Managed Care) | Admitting: Student in an Organized Health Care Education/Training Program

## 2023-12-13 VITALS — BP 133/67 | HR 80 | Ht 69.0 in | Wt 238.0 lb

## 2023-12-13 DIAGNOSIS — M80052K Age-related osteoporosis with current pathological fracture, left femur, subsequent encounter for fracture with nonunion: Secondary | ICD-10-CM

## 2023-12-13 NOTE — Progress Notes (Signed)
 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 DOS: 09/23/23.    Walking with a cane. She reports both of her knees feel very heavy and tight, left worse than right. She also continues to have left hip pain. She takes Tylenol  daily for her pain. Working with PT, will need a new script today.        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 She is ambulating with a cane.  She reports that most of her pain is in her left knee.  She is continuing to make good progress now only using 1 cane as compared to preoperatively dual canes.  She does feel like she continues to have a shorter left lower extremity then right lower extremity.  She is not having any pain in her right knee or leg.  Overall she is making good progress with regards to her recovery however her expectations have increased admittedly and she now is much more functional.  She is taking occasional Tylenol  or ibuprofen but overall is very limited in her pain medication use.  She does not feel pain in her thigh or at the area where her fracture was, although she continues to have knee pain and occasional weakness/buckling when she is attempting to walk without assistive device which she does at home.    Social History[1]    PE:   Vitals:    12/13/23 1257   BP: 133/67   Pulse: 80     Left lower extremity  Incisions are well-healed.  Knee range of motion from 0-100 without pain  +DF/PF foot.  Sensation intact light touch dorsal/plantar foot.    Right lower extremity  Incisions are well-healed.  Knee range of motion from 0-100 without pain  +DF/PF foot.  Sensation intact light touch dorsal/plantar foot.    Radiology: Xrays taken in clinic today: Left femur AP and lateral demonstrate intramedullary nail with intact hardware.  There  appears to be increased progression of callus formation and fracture consolidation at the nonunion site.  Fracture line still appears somewhat visible.  There is no evidence of hardware loosening or failure.  Alignment is maintained.    Impression: 63 year old female about 2 and half months status post left distal femur nonunion repair   Plan:    Continue with weightbearing and range of motion as tolerated bilateral lower extremities.  She can continue to attempt shoe lift wear although she does not tolerate this very well and prefers to walk without one.  Her pain appears to be mostly consistent with knee pain secondary to arthritis and she does not appear to have any clinical pain or symptoms at the nonunion site which is somewhat reassuring.  She is not completely healed radiographically although I would not expect that at this point.  I again discussed with her realistic goals and expectations for recovery.  I will plan to see her back in approximately 2 to 3 months with repeat x-rays at that time. They are understanding and in agreement with the plan as discussed. All questions answered.       Xrays @ next clinic visit: Left femur AP lateral x-rays    I have personally interviewed and examined the patient.  I have reviewed the provider's history, exam, assessment  and management plans. I concur with or have edited all elements of the provider's note.      Erle Piccolo, MD      This note was generated within the EPIC EMR using Dragon medical speech recognition software and may contain inherent errors or omissions not intended by the user. Grammatical and punctuation errors, random word insertions, deletions, pronoun errors and incomplete sentences are occasional consequences of this technology due to software limitations. Not all errors are caught or corrected.  Although every attempt is made to root out erroneus and incomplete transcription, the note may still not fully represent the intent or opinion of the  author. If there are questions or concerns about the content of this note or information contained within the body of this dictation they should be addressed directly with the author for clarification.            [1]   Social History  Tobacco Use    Smoking status: Never    Smokeless tobacco: Never   Vaping Use    Vaping status: Never Used   Substance Use Topics    Alcohol use: Never    Drug use: Never

## 2024-02-11 ENCOUNTER — Ambulatory Visit (INDEPENDENT_AMBULATORY_CARE_PROVIDER_SITE_OTHER): Payer: Self-pay

## 2024-02-13 ENCOUNTER — Encounter (INDEPENDENT_AMBULATORY_CARE_PROVIDER_SITE_OTHER): Payer: Self-pay

## 2024-02-13 ENCOUNTER — Ambulatory Visit (INDEPENDENT_AMBULATORY_CARE_PROVIDER_SITE_OTHER)
Admission: RE | Admit: 2024-02-13 | Discharge: 2024-02-13 | Disposition: A | Discharge status: Still In Hospital | Payer: Self-pay | Source: Ambulatory Visit | Attending: ORTHOPAEDIC SURGERY | Admitting: ORTHOPAEDIC SURGERY

## 2024-02-13 ENCOUNTER — Other Ambulatory Visit: Payer: Self-pay

## 2024-02-13 DIAGNOSIS — M898X5 Other specified disorders of bone, thigh: Secondary | ICD-10-CM | POA: Insufficient documentation

## 2024-02-13 DIAGNOSIS — M25561 Pain in right knee: Secondary | ICD-10-CM | POA: Diagnosis present

## 2024-02-13 DIAGNOSIS — M80052K Age-related osteoporosis with current pathological fracture, left femur, subsequent encounter for fracture with nonunion: Secondary | ICD-10-CM | POA: Diagnosis present

## 2024-02-13 DIAGNOSIS — M25562 Pain in left knee: Secondary | ICD-10-CM | POA: Diagnosis present

## 2024-02-13 NOTE — PT Evaluation (Addendum)
 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 I was in the hospital during the holidays. We went to [facility] to that appointment in Feburary. I go back to him next month."    History of Present Illness: Surgery was performed because it wasn't fusing. They put in a stronger implant. No restrictions were reported. She reported some days it feels good, and other days, not. Order states WBAT on B LEs. Unlimited AROM and PROM in hip and knee. Surgery was on the L femur but they harvested from the R.     Medications:   No changes in medications since she was last here.     Past Medical History:  No changes    Past Surgical History:   No other new surgeries except for the only listed above.     Activities of Daily Living: She has difficulty walking and standing for long periods of time. She has trouble with balancing, especially when getting in the shower. Sometimes she uses the shower chair. She has a fear of falling, with her L knee buckling,     Occupation: Retired    Living Situation: Lives with husband. She doesn't have any steps into her house. She has 3 stories. She stays on the main floor. She can get everywhere safely when in the house.     Pain: Pain most significant in lateral mid femur, L hip, and medial knee. Her L foot is numb. She reported B knee and R leg numbness too. It affects her sleep. Heat decreases her pain. She takes IBU as well. Staying off of it and resting it helps.     Handedness: R-handed.     Fall Risk: No falls in the past year.           OBJECTIVE  Palpation: Tenderness reported in her medial knee. Tenderness reported in her lateral quad. Pain reported in femoral head, pain with hip IR/ER.     Observation: Leg length discrepancy present, with R being longer.     AROM:   Hip  Flexion WNL B    Knee  Extension slight limitations B but WNL  Flexion WNL B    Strength:   Hip   Flexion R 4+/5 L 4/5  ABD (standing) R 3+/5 L 4/5   Extension (standing) R 4/5 L 5/5   IR R 5/5 L 4/5   ER R 5/5 L 3+/5     Knee  Flexion R 5/5 L 4/5   Extension R 5/5 L 3+/5     Functional Testing:   SLS EO firm surface R 20.3" L 7.7"  30" STS with UE assistance -10 reps    Gait: She ambulates at a good gait speed, with a SPC, with some limp. On steps, she does a step-to up and down with the cane and railing.         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 Impairments include gross LE weakness, more so on her L though. Functional limitations include difficulty with standing and walking for long periods of time. She also has instability, especially when getting in the shower. Skilled care is medically necessary in order to address the listed impairments and functional limitations through the given POC, utilizing: therapeutic exercise, therapeutic activity, neuromuscular re-education, gait training, manual therapy, and modalities, as needed, for pain management. Patient would benefit from physical therapy services at a frequency of 1-2x/week for 10 weeks with a fair prognosis pending compliance with physical therapy to achieve the goals listed below and to maximize their quality of life.      Rehab  potential: FAIR    Short Term Goals: (By: 10 visits)  Patient will be independent with HEP for ROM/flexibility and strengthening for increased ease of ADLs.  L hip flexion MMT improves to 4+/5, to allow for improvements in ambulation endurance.   R hip ABD MMT, in standing, improves to 4/5, to allow for improvements in standing endurance.   R hip extension MMT, in standing, improves to 4+/5, to allow for improvements in ambulation endurance.   L hip IR MMT improves to 4+/5, to allow for improvements in ambulation endurance.  L hip ER MMT improves to 4/5, to allow for improvements in ambulation endurance.   L SLS EO on a firm surface improves to 15", to allow for improvements in her showering stability.   Long Term Goals: (By: discharge)  B hip flexion MMT improves to 5/5, to allow for improvements in ambulation endurance.   B hip ABD MMT, in standing, improves to 4+/5, to allow for improvements in standing endurance.   R hip extension MMT, in standing, improves to 5/5, to allow for improvements in ambulation endurance.   L hip IR MMT improves to 5/5, to allow for improvements in ambulation endurance.  L hip ER MMT improves to 4+/5, to allow for improvements in ambulation endurance.   B SLS EO on a firm surface improves to 30", to allow for improvements in showering stability.   Pt is able to perform 10 reps with 30"STS, without UE assistance, demonstrating improvements in transferring safely and to decrease fall risk.     Plan for next visit: Perform 6 MWT.     Total session time: 34 minutes  Untimed minutes: 34 minutes  Intervention minutes: Re-evaluation 34 minutes    Signature: Mallie Darting, PT, DPT, ATC 02/13/2024 11:27-12:01        Certification: 02/13/2024-05/13/24    I certify the need for these services furnished under this plan of treatment and while under my care.    Referring Provider Signature: _____________________________ Date :______________________    Printed name of Referring Provider:  ___________________________________________________

## 2024-02-18 ENCOUNTER — Other Ambulatory Visit: Payer: Self-pay

## 2024-02-18 ENCOUNTER — Ambulatory Visit (INDEPENDENT_AMBULATORY_CARE_PROVIDER_SITE_OTHER)
Admission: RE | Admit: 2024-02-18 | Discharge: 2024-02-18 | Disposition: A | Discharge status: Still In Hospital | Payer: Self-pay | Source: Ambulatory Visit

## 2024-02-18 NOTE — PT Treatment (Signed)
 Outpatient Physical Therapy  [address]  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 It hurt the next day, I could tell. Pain is normal. The normal daily grind. Pain 3-4/10."    Objective:  Therapeutic Exercise:   Upright bike x10' (seat 5)  DL shuttle presses 469#   SL shuttle presses 50# B  Standing marches 3# B  Standing hip ABD 3# B  STS (no UE assistance) 3x5     HEP: None    Assessment: Upright bike was added today as a warm-up. She reported tightness in her L knee and femur. She reported she tolerated it in her hip. Shuttle presses were added today to improve knee extension and hip extension strength, to progress towards unmet goals. She had more difficulty on her L. She reported no increase in pain. Standing hip 3-way was added today to improve gross hip strength and endurance. She demonstrated good endurance with flexion. She didn't have as much endurance for hip ABD. She reported symptoms when offloading her L LE. Patient would continue to benefit from physical therapy to address their impairments and functional limitations. Continue physical therapy plan of care to progress towards their goals.     Plan: Provide HEP.     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  L hip flexion MMT improves to 4+/5, to allow for improvements in ambulation endurance.   R hip ABD MMT, in standing, improves to 4/5, to allow for improvements in standing endurance.   R hip extension MMT, in standing, improves to 4+/5, to allow for improvements in ambulation endurance.   L hip IR MMT improves to 4+/5, to allow for improvements in ambulation endurance.  L hip ER MMT improves to 4/5, to allow for improvements in ambulation endurance.   L SLS EO on a firm surface improves to 15", to allow for improvements in her showering stability.   Long Term Goals: (By: discharge)  B hip flexion MMT improves to 5/5, to allow for improvements in ambulation endurance.   B hip ABD MMT, in standing, improves to 4+/5, to allow for improvements in standing endurance.   R hip extension MMT, in standing, improves to 5/5, to allow for improvements in ambulation endurance.   L hip IR MMT improves to 5/5, to allow for improvements in ambulation endurance.  L hip ER MMT improves to 4+/5, to allow for improvements in ambulation endurance.   B SLS EO on a firm surface improves to 30", to allow for improvements in showering stability.   Pt is able  to perform 10 reps with 30"STS, without UE assistance, demonstrating improvements in transferring safely and to decrease fall risk.     Total Session Time: 39 minutes  Timed Minutes: 39 minutes  Intervention(s): Therapeutic Exercise 39 minutes    Signature: Franklin Ito, PT, DPT, ATC 02/18/2024 11:25-12:04

## 2024-02-20 ENCOUNTER — Other Ambulatory Visit: Payer: Self-pay

## 2024-02-20 ENCOUNTER — Ambulatory Visit (INDEPENDENT_AMBULATORY_CARE_PROVIDER_SITE_OTHER)
Admission: RE | Admit: 2024-02-20 | Discharge: 2024-02-20 | Disposition: A | Discharge status: Still In Hospital | Payer: Self-pay | Source: Ambulatory Visit

## 2024-02-20 ENCOUNTER — Encounter (INDEPENDENT_AMBULATORY_CARE_PROVIDER_SITE_OTHER): Payer: Self-pay

## 2024-02-20 NOTE — PT Treatment (Signed)
 Outpatient Physical Therapy  [address]  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 I rested it yesterday, a good bit. I don't know if it's what we did the other day. I have good days and bad days, and I don't know why. I don't know if it's from the way I sleep. Pain 4/10. It's hurting up in here today, L hip."    Objective:  Therapeutic Exercise:   Upright bike  DL shuttle presses 474#  SL shuttle presses 50# B  STS (no UE assistance) 3x5   Standing marches 0# x30 B  Standing hip ABD 0# x30 B  Standing hip extensions 0# x30 B  Resisted lateral stepping BTB     HEP: None    Assessment: An HEP was provided today. This consisted of STS, standing hip ABD, standing marches, and standing hip extensions. Standing hip extension was added today as an exercise to improve glute max strength, to then progress towards unmet goals. She had the most pain when offloading her L LE. Pt was provided a BTB. Patient would continue to benefit from physical therapy to address their impairments and functional limitations. Continue physical therapy plan of care to progress towards their goals.     Plan: Add hip IR and ER.    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  L hip flexion MMT improves to 4+/5, to allow for improvements in ambulation endurance.   R hip ABD MMT, in standing, improves to 4/5, to allow for improvements in standing endurance.   R hip extension MMT, in standing, improves to 4+/5, to allow for improvements in ambulation endurance.   L hip IR MMT improves to 4+/5, to allow for improvements in ambulation endurance.  L hip ER MMT improves to 4/5, to allow for improvements in ambulation endurance.   L SLS EO on a firm surface improves to 15", to allow for improvements in her showering stability.   Long Term Goals: (By: discharge)  B hip flexion MMT improves to 5/5, to allow for improvements in ambulation endurance.   B hip ABD MMT, in standing, improves to 4+/5, to allow for improvements in standing endurance.   R hip extension MMT, in standing, improves to 5/5, to allow for improvements in ambulation endurance.   L hip IR MMT improves to 5/5, to allow for improvements in ambulation endurance.  L hip ER MMT improves to 4+/5, to allow for improvements in ambulation endurance.   B SLS EO on a firm surface improves to 30", to allow for improvements in showering stability.  Pt is able to perform 10 reps with 30"STS, without UE assistance, demonstrating improvements in transferring safely and to decrease fall risk.     Total Session Time: 40 minutes  Timed Minutes: 40 minutes  Intervention(s): Therapeutic Exercise 40 minutes    Signature: Franklin Ito, PT, DPT, ATC 02/20/2024 11:25-12:05

## 2024-02-25 ENCOUNTER — Ambulatory Visit (INDEPENDENT_AMBULATORY_CARE_PROVIDER_SITE_OTHER)
Admission: RE | Admit: 2024-02-25 | Discharge: 2024-02-25 | Disposition: A | Discharge status: Still In Hospital | Payer: Self-pay | Source: Ambulatory Visit

## 2024-02-25 ENCOUNTER — Other Ambulatory Visit: Payer: Self-pay

## 2024-02-25 NOTE — PT Treatment (Incomplete)
 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 It started yesterday. I mopped the floors and put stuff away, doing regular household stuff. Dishes. Spring cleaning kind of stuff. Laundry. [facility]. It was sore after PT.    Objective:  Therapeutic Exercise:   Upright bike (seat 5) x5'   DL shuttle presses 130#  SL shuttle presses 50# R 25# L   Seated hip ER BTB B (tolerable pain on L, much stronger on her R)  Seated hip IR BTB (less challegning, but still challenging  STS (UE assistance) x5     Held today:  Standing marches 0# x30 B  Standing hip ABD 0# x30 B  Standing hip extensions 0# x30 B  Resisted lateral stepping BTB     HEP: None    Assessment: Patient would continue to benefit from physical therapy to address their impairments and functional limitations. Continue physical therapy plan of care to progress towards their goals.     Plan: Add hip IR and ER.    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  L hip flexion MMT improves to 4+/5, to allow for improvements in  ambulation endurance.   R hip ABD MMT, in standing, improves to 4/5, to allow for improvements in standing endurance.   R hip extension MMT, in standing, improves to 4+/5, to allow for improvements in ambulation endurance.   L hip IR MMT improves to 4+/5, to allow for improvements in ambulation endurance.  L hip ER MMT improves to 4/5, to allow for improvements in ambulation endurance.   L SLS EO on a firm surface improves to 15", to allow for improvements in her showering stability.   Long Term Goals: (By: discharge)  B hip flexion MMT improves to 5/5, to allow for improvements in ambulation endurance.   B hip ABD MMT, in standing, improves to 4+/5, to allow for improvements in standing endurance.   R hip extension MMT, in standing, improves to 5/5, to allow for improvements in ambulation endurance.   L hip IR MMT improves to 5/5, to allow for improvements in ambulation endurance.  L hip ER MMT improves to 4+/5, to allow for improvements in ambulation endurance.   B SLS EO on a firm surface improves to 30", to allow for improvements in showering stability.   Pt is able to perform 10 reps with 30"STS, without UE assistance, demonstrating improvements in transferring safely and to decrease fall risk.  Total Session Time: 25 minutes  Timed Minutes: 25 minutes  Intervention(s): Therapeutic Exercise 25 minutes    Signature: Franklin Ito, PT, DPT, ATC 02/25/2024 11:44-12:09

## 2024-02-27 ENCOUNTER — Ambulatory Visit (INDEPENDENT_AMBULATORY_CARE_PROVIDER_SITE_OTHER): Payer: Self-pay

## 2024-03-03 ENCOUNTER — Ambulatory Visit
Admission: RE | Admit: 2024-03-03 | Discharge: 2024-03-03 | Disposition: A | Discharge status: Still In Hospital | Payer: Self-pay | Source: Ambulatory Visit | Attending: ORTHOPAEDIC SURGERY | Admitting: ORTHOPAEDIC SURGERY

## 2024-03-03 ENCOUNTER — Other Ambulatory Visit: Payer: Self-pay

## 2024-03-03 NOTE — PT Treatment (Signed)
 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 Today is the Norris day I wore it, to see if it helps with the height difference. I rested the leg. It seems a little better. It was hurting last night. I'm afraid it's not healing. I go back to him next week."    Objective:  Therapeutic Exercise:   Upright bike (seat 5) x5'   DL shuttle presses 130#  SL shuttle presses 50# B   STS (no UE assistance) 2x10  Step ups/downs 6" B  Lateral step ups/downs 6" B  Standing marches 0# B  Standing hip ABD 0# B    Held today:  Standing hip extensions 0# x30 B  Resisted lateral stepping BTB   Seated hip ER BTB B   Seated hip IR BTB    HEP: None    Assessment: Steps were added today to improve LE strength, specifically her glutes, to progress towards unmet goals. She tolerated step ups and downs, with some reports in her L medial quad. With lateral step ups, when going up on the L, she reported pain. Patient would continue to benefit from physical therapy to address their impairments and functional limitations. Continue physical therapy plan of care to progress towards their goals.     Plan: Add knee drives.     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  L hip flexion MMT improves to 4+/5, to allow for improvements in ambulation endurance.   R hip ABD MMT, in standing, improves to 4/5, to allow for improvements in standing endurance.   R hip extension MMT, in standing, improves to 4+/5, to allow for improvements in ambulation endurance.   L hip IR MMT improves to 4+/5, to allow for improvements in ambulation endurance.  L hip ER MMT improves to 4/5, to allow for improvements in ambulation endurance.   L SLS EO on a firm surface improves to 15", to allow for improvements in her showering stability.   Long Term Goals: (By: discharge)  B hip flexion MMT improves to 5/5, to allow for improvements in ambulation endurance.   B hip ABD MMT, in standing, improves to 4+/5, to allow for improvements in standing endurance.   R hip extension MMT, in standing, improves to 5/5, to allow for improvements in ambulation endurance.   L hip IR MMT improves to 5/5, to allow for improvements in ambulation endurance.  L hip ER MMT improves to 4+/5, to allow for improvements in ambulation endurance.  B SLS EO on a firm surface improves to 30", to allow for improvements in showering stability.   Pt is able to perform 10 reps with 30"STS, without UE assistance, demonstrating improvements in transferring safely and to decrease fall risk.     Total Session Time: 28 minutes  Timed Minutes: 28 minutes  Intervention(s): Therapeutic Exercise 28 minutes    Signature: Franklin Ito, PT, DPT, ATC 03/03/2024 11:39-12:07

## 2024-03-05 ENCOUNTER — Ambulatory Visit (INDEPENDENT_AMBULATORY_CARE_PROVIDER_SITE_OTHER): Payer: Self-pay

## 2024-03-06 ENCOUNTER — Ambulatory Visit (INDEPENDENT_AMBULATORY_CARE_PROVIDER_SITE_OTHER): Payer: Medicare (Managed Care) | Admitting: Student in an Organized Health Care Education/Training Program

## 2024-03-10 ENCOUNTER — Ambulatory Visit
Admission: RE | Admit: 2024-03-10 | Discharge: 2024-03-10 | Disposition: A | Discharge status: Still In Hospital | Source: Ambulatory Visit | Attending: ORTHOPAEDIC SURGERY | Admitting: ORTHOPAEDIC SURGERY

## 2024-03-10 ENCOUNTER — Other Ambulatory Visit: Payer: Self-pay

## 2024-03-10 DIAGNOSIS — M898X5 Other specified disorders of bone, thigh: Secondary | ICD-10-CM | POA: Insufficient documentation

## 2024-03-10 NOTE — PT Treatment (Signed)
 Outpatient Physical Therapy  [address]  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 It's not as bad as it was the week where I basically did nothing, but it's close to that. Pain 4-5/10. At night, it wakes up with a jabbing pain sometimes."    Objective:  Therapeutic Exercise:   Upright bike (seat 5) x7'   Step ups/downs 6" B  Lateral step ups/downs 6" B  Knee drives 3# B  Standing marches 3# B  Standing hamstring curls 3# B    Held today:  DL shuttle presses 130# B  SL shuttle presses 50# B   STS (no UE assistance) 2x10  Standing hip ABD 0# B  Standing hip extensions 0# x30 B  Resisted lateral stepping BTB   Seated hip ER BTB B   Seated hip IR BTB    HEP: None    Assessment: Knee drives were added today to improve stair navigation, along with hip flexion strength. When stepping up with her L, she reported pain. She rated it as a 5-6/10. Patient would continue to benefit from physical therapy to address their impairments and functional limitations. Continue physical therapy plan of care to progress towards their goals.     Plan: Add balance.     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  L hip flexion MMT improves to 4+/5, to allow for improvements in ambulation endurance.   R hip ABD MMT, in standing, improves to 4/5, to allow for improvements in standing endurance.   R hip extension MMT, in standing, improves to 4+/5, to allow for improvements in ambulation endurance.   L hip IR MMT improves to 4+/5, to allow for improvements in ambulation endurance.  L hip ER MMT improves to 4/5, to allow for improvements in ambulation endurance.   L SLS EO on a firm surface improves to 15", to allow for improvements in her showering stability.   Long Term Goals: (By: discharge)  B hip flexion MMT improves to 5/5, to allow for improvements in ambulation endurance.   B hip ABD MMT, in standing, improves to 4+/5, to allow for improvements in standing endurance.   R hip extension MMT, in standing, improves to 5/5, to allow for improvements in ambulation endurance.   L hip IR MMT improves to 5/5, to allow for improvements in ambulation endurance.  L hip ER MMT improves to 4+/5, to allow for improvements in ambulation endurance.   B SLS EO on a firm surface improves to 30", to allow for improvements in showering stability.  Pt is able to perform 10 reps with 30"STS, without UE assistance, demonstrating improvements in transferring safely and to decrease fall risk.     Total Session Time: 25 minutes  Timed Minutes: 25 minutes  Intervention(s): Therapeutic Exercise 25 minutes    Signature: Franklin Ito, PT, DPT, ATC 03/10/2024 11:09-11:34

## 2024-03-11 ENCOUNTER — Ambulatory Visit (INDEPENDENT_AMBULATORY_CARE_PROVIDER_SITE_OTHER)

## 2024-03-12 ENCOUNTER — Ambulatory Visit (INDEPENDENT_AMBULATORY_CARE_PROVIDER_SITE_OTHER)

## 2024-03-12 ENCOUNTER — Encounter (INDEPENDENT_AMBULATORY_CARE_PROVIDER_SITE_OTHER): Payer: Self-pay | Admitting: Student in an Organized Health Care Education/Training Program

## 2024-03-12 ENCOUNTER — Encounter (INDEPENDENT_AMBULATORY_CARE_PROVIDER_SITE_OTHER): Payer: Self-pay

## 2024-03-12 NOTE — Progress Notes (Signed)
 POC sent to [facility] at [phone].

## 2024-03-13 ENCOUNTER — Ambulatory Visit (INDEPENDENT_AMBULATORY_CARE_PROVIDER_SITE_OTHER): Admitting: Student in an Organized Health Care Education/Training Program

## 2024-03-17 ENCOUNTER — Ambulatory Visit (INDEPENDENT_AMBULATORY_CARE_PROVIDER_SITE_OTHER)

## 2024-03-19 ENCOUNTER — Ambulatory Visit (INDEPENDENT_AMBULATORY_CARE_PROVIDER_SITE_OTHER)

## 2024-03-24 ENCOUNTER — Ambulatory Visit (INDEPENDENT_AMBULATORY_CARE_PROVIDER_SITE_OTHER)

## 2024-03-26 ENCOUNTER — Ambulatory Visit (INDEPENDENT_AMBULATORY_CARE_PROVIDER_SITE_OTHER)

## 2024-03-31 ENCOUNTER — Ambulatory Visit (INDEPENDENT_AMBULATORY_CARE_PROVIDER_SITE_OTHER)

## 2024-04-02 ENCOUNTER — Ambulatory Visit (INDEPENDENT_AMBULATORY_CARE_PROVIDER_SITE_OTHER)

## 2024-04-08 ENCOUNTER — Ambulatory Visit (INDEPENDENT_AMBULATORY_CARE_PROVIDER_SITE_OTHER)

## 2024-04-10 ENCOUNTER — Ambulatory Visit (INDEPENDENT_AMBULATORY_CARE_PROVIDER_SITE_OTHER)

## 2024-04-14 ENCOUNTER — Ambulatory Visit (INDEPENDENT_AMBULATORY_CARE_PROVIDER_SITE_OTHER)

## 2024-04-16 ENCOUNTER — Ambulatory Visit (INDEPENDENT_AMBULATORY_CARE_PROVIDER_SITE_OTHER)

## 2024-04-20 ENCOUNTER — Ambulatory Visit (INDEPENDENT_AMBULATORY_CARE_PROVIDER_SITE_OTHER)

## 2024-04-22 ENCOUNTER — Ambulatory Visit (INDEPENDENT_AMBULATORY_CARE_PROVIDER_SITE_OTHER)

## 2024-04-24 ENCOUNTER — Ambulatory Visit (INDEPENDENT_AMBULATORY_CARE_PROVIDER_SITE_OTHER): Admitting: Student in an Organized Health Care Education/Training Program

## 2024-05-06 ENCOUNTER — Ambulatory Visit (INDEPENDENT_AMBULATORY_CARE_PROVIDER_SITE_OTHER)

## 2024-05-12 ENCOUNTER — Ambulatory Visit (INDEPENDENT_AMBULATORY_CARE_PROVIDER_SITE_OTHER)

## 2024-05-14 ENCOUNTER — Ambulatory Visit (INDEPENDENT_AMBULATORY_CARE_PROVIDER_SITE_OTHER)

## 2024-05-19 ENCOUNTER — Other Ambulatory Visit: Payer: Self-pay

## 2024-05-19 ENCOUNTER — Ambulatory Visit (INDEPENDENT_AMBULATORY_CARE_PROVIDER_SITE_OTHER)
Admission: RE | Admit: 2024-05-19 | Discharge: 2024-05-19 | Disposition: A | Discharge status: Still In Hospital | Source: Ambulatory Visit | Attending: ORTHOPAEDIC SURGERY | Admitting: ORTHOPAEDIC SURGERY

## 2024-05-19 DIAGNOSIS — M898X5 Other specified disorders of bone, thigh: Secondary | ICD-10-CM | POA: Insufficient documentation

## 2024-05-19 NOTE — PT Evaluation (Signed)
 Outpatient Physical Therapy  [address]  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 I don't know if it's supposed to be like this. It'll be 8 months in August since my last sugery. I'm on it. I got back to him next month. I was supposed to go today, but they rescheduled the appointment to August 15th. I'm wondering about a TKA.    Objective:  Hip MMT  Flexion R 5/5 L 4+/5   ABD (standing) R 3+/5 L 5/5   Extension (standing) R 4+/5  L 5/5   IR R 5/5 L 4/5   ER R 5/5 L 4+/5     SLS R 19.6 L 3    30 STS (no UE assistance) - 3 reps    Therapeutic Exercise:   Upright bike (seat 5) x5'   Step ups/downs 6 B  Lateral step ups/downs 6 B  SL shuttle presses 50# B     Held today:  Knee drives 3# B  Standing marches 3# B  Standing hamstring curls 3# B  DL shuttle presses 899# B  STS (no UE assistance) 2x10  Standing hip ABD 0# B  Standing hip extensions 0# x30 B  Resisted lateral stepping BTB   Seated hip ER BTB B   Seated hip IR BTB    HEP: None    Assessment:  A re-certification note was performed today. For hip flexion MMT, pt was 5/5 R and 4+/5 L. For hip ABD, pt was 3+/5 R and 5/5 L. Hip extension was 4+/5 R and 5/5 L. For hip IR, R was 5/5 and L was 4/5. Lastly, for hip ER, pt was 5/5 R and 4+/5 L. SLS was 19.6 R and 3 L. Lastly, pt was able to perform 3 reps for 30 STS, without UE assistance. Because of these improvements, patient met 4 short-term goals and 1  long-term goal. Patient would continue to benefit from physical therapy at a frequency of 2x/week, for an additional 10 visits/5 weeks, to progress towards their unmet goals.    Plan: Add balance.     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 MET 05/19/24  L hip flexion MMT improves to 4+/5, to allow for improvements in ambulation endurance. MET 05/19/24  R hip ABD MMT, in standing, improves to 4/5, to allow for improvements in standing endurance. NOT MET   R hip extension MMT, in standing, improves to 4+/5, to allow for improvements in ambulation endurance. MET 05/19/24  L hip IR MMT improves to 4+/5, to allow for improvements  in ambulation endurance. NOT MET   L hip ER MMT improves to 4/5, to allow for improvements in ambulation endurance. MET 05/19/24  L SLS EO on a firm surface improves to 15, to allow for improvements in her showering stability. NOT MET   Long Term Goals: (By: discharge)  B hip flexion MMT improves to 5/5, to allow for improvements in ambulation endurance.   B hip ABD MMT, in standing, improves to 4+/5, to allow for improvements in standing endurance.   R hip extension MMT, in standing, improves to 5/5, to allow for improvements in ambulation endurance.   L hip IR MMT improves to 5/5, to allow for improvements in ambulation endurance.  L hip ER MMT improves to 4+/5, to allow for improvements in ambulation endurance. MET 05/19/24  B SLS EO on a firm surface improves to 30, to allow for improvements in showering stability.   Pt is able to perform 10 reps with 30STS, without UE assistance, demonstrating improvements in transferring safely and to decrease fall risk.     Total  Session Time: 42 minutes  Timed Minutes: 42 minutes  Intervention(s): Therapeutic Exercise 42 minutes    Signature: Toribio Cluck, PT, DPT, ATC 05/19/2024 3:37-4:19

## 2024-05-21 ENCOUNTER — Ambulatory Visit
Admission: RE | Admit: 2024-05-21 | Discharge: 2024-05-21 | Disposition: A | Discharge status: Still In Hospital | Source: Ambulatory Visit | Attending: ORTHOPAEDIC SURGERY | Admitting: ORTHOPAEDIC SURGERY

## 2024-05-21 NOTE — PT Treatment (Signed)
 Outpatient Physical Therapy  [address]  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 It still hurts. I don't think it's any worse. I have been de-cluttering my house. Pain 5/10.    Objective:  Therapeutic Exercise:   Upright bike (seat 5) x5'   SL shuttle presses 50# 3x10 B   STS (no UE assistance, 2 airex)    Held today:  Step ups/downs 6 B  Lateral step ups/downs 6 B  Knee drives 3# B  Standing marches 3# B  Standing hamstring curls 3# B  DL shuttle presses 899# B  Standing hip ABD 0# B  Standing hip extensions 0# x30 B  Resisted lateral stepping BTB   Seated hip ER BTB B   Seated hip IR BTB    Neuromuscular Re-education: (3:39-3:49)  NBOS EC foam surface x1'   Standing marches airex   SLS EO firm surface   Step ups (dyandisc)     HEP: None    Assessment: Balance was introduced today to progress towards unmet goals. To improve her SLS tolerance, marches on the airex were added. Lastly, step ups on the dyandisc were added, to challenge her dynamic SLS balance. Patient would continue to benefit from physical therapy to address their impairments and functional limitations. Continue physical therapy plan of care to progress towards their goals.     Plan: Progress balance.     Patient Active Problem List   Diagnosis    Headaches    Seizures    Pain of left femur    Right knee pain    Left knee pain    Age-related osteoporosis with current pathological fracture of left  femur with nonunion     Goals:   Short Term Goals: (By: 10 visits)  Patient will be independent with HEP for ROM/flexibility and strengthening for increased ease of ADLs. MET 05/19/24  L hip flexion MMT improves to 4+/5, to allow for improvements in ambulation endurance. MET 05/19/24  R hip ABD MMT, in standing, improves to 4/5, to allow for improvements in standing endurance. NOT MET   R hip extension MMT, in standing, improves to 4+/5, to allow for improvements in ambulation endurance. MET 05/19/24  L hip IR MMT improves to 4+/5, to allow for improvements in ambulation endurance. NOT MET   L hip ER MMT improves to 4/5, to allow for improvements in ambulation endurance. MET 05/19/24  L SLS EO on a firm surface improves to 15, to allow for improvements in her showering stability. NOT MET   Long Term Goals: (By: discharge)  B hip flexion MMT improves to 5/5, to allow for improvements in ambulation endurance.   B hip ABD MMT, in standing, improves to 4+/5, to allow for improvements in standing endurance.   R hip extension MMT, in standing, improves to 5/5, to allow for improvements in ambulation endurance.   L hip IR MMT improves to 5/5, to allow for improvements in ambulation endurance.  L  hip ER MMT improves to 4+/5, to allow for improvements in ambulation endurance. MET 05/19/24  B SLS EO on a firm surface improves to 30, to allow for improvements in showering stability.   Pt is able to perform 10 reps with 30STS, without UE assistance, demonstrating improvements in transferring safely and to decrease fall risk.     Total Session Time: 26 minutes  Timed Minutes: 26 minutes  Intervention(s): Therapeutic Exercise 16 minutes, Neuromuscular Re-education 10 minutes     Signature: Toribio Cluck, PT, DPT, ATC 05/21/2024 3:23-3:49

## 2024-05-22 ENCOUNTER — Ambulatory Visit (INDEPENDENT_AMBULATORY_CARE_PROVIDER_SITE_OTHER): Admitting: Student in an Organized Health Care Education/Training Program

## 2024-05-25 ENCOUNTER — Ambulatory Visit (INDEPENDENT_AMBULATORY_CARE_PROVIDER_SITE_OTHER)

## 2024-05-27 ENCOUNTER — Ambulatory Visit (INDEPENDENT_AMBULATORY_CARE_PROVIDER_SITE_OTHER)

## 2024-06-01 ENCOUNTER — Ambulatory Visit (INDEPENDENT_AMBULATORY_CARE_PROVIDER_SITE_OTHER)
Admission: RE | Admit: 2024-06-01 | Discharge: 2024-06-01 | Disposition: A | Discharge status: Still In Hospital | Source: Ambulatory Visit | Attending: ORTHOPAEDIC SURGERY | Admitting: ORTHOPAEDIC SURGERY

## 2024-06-01 ENCOUNTER — Other Ambulatory Visit: Payer: Self-pay

## 2024-06-01 NOTE — PT Treatment (Signed)
 Outpatient Physical Therapy  [address]  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 When I go to the doctor next time, I was going to go through [place]. We've been decluttering today. Knee pain 3-4/10.    Objective:  Therapeutic Exercise:   Upright bike (seat 5) x7'   STS (no UE assistance, 2 airex->1 airex)   SL shuttle presses 75# x30 R 50# x30 L   Step ups/downs 6 B    Held today:  Knee drives 3# B  Standing marches 3# B  Standing hamstring curls 3# B  DL shuttle presses 899# B  Standing hip ABD 0# B  Standing hip extensions 0# x30 B  Resisted lateral stepping BTB   Seated hip ER BTB B   Seated hip IR BTB  Lateral step ups/downs 6 B    Neuromuscular Re-education: (3:54-4:09)  NBOS EC foam surface x1'   NBOS foam surface ball tosses 2#   SLS EO firm surface x1'    Held today:   Standing marches airex   Step ups (dyandisc)     HEP: None    Assessment: Pt was able to decrease to 1 airex pad for STS and performed it consistently, demonstrating improvements in LE strength and safe transferring ability. Ball tosses on the airex were added today to improve her dynamic balance. She was steady with forward passes. Therefore, she progressed to rotational passes. She only performed 1 side, due to her vision deficits. Patient would continue to benefit from physical therapy to address their impairments and functional limitations. Continue physical therapy plan of care  to progress towards their goals.     Plan: Progress balance.     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 MET 05/19/24  L hip flexion MMT improves to 4+/5, to allow for improvements in ambulation endurance. MET 05/19/24  R hip ABD MMT, in standing, improves to 4/5, to allow for improvements in standing endurance. NOT MET   R hip extension MMT, in standing, improves to 4+/5, to allow for improvements in ambulation endurance. MET 05/19/24  L hip IR MMT improves to 4+/5, to allow for improvements in ambulation endurance. NOT MET   L hip ER MMT improves to 4/5, to allow for improvements in ambulation endurance. MET 05/19/24  L SLS EO on a firm surface improves to 15, to allow for improvements in her showering stability. NOT MET   Long Term Goals: (By: discharge)  B hip flexion MMT improves to 5/5, to allow for improvements in ambulation endurance.  B hip ABD MMT, in standing, improves to 4+/5, to allow for improvements in standing endurance.   R hip extension MMT, in standing, improves to 5/5, to allow for improvements in ambulation endurance.   L hip IR MMT improves to 5/5, to allow for improvements in ambulation endurance.  L hip ER MMT improves to 4+/5, to allow for improvements in ambulation endurance. MET 05/19/24  B SLS EO on a firm surface improves to 30, to allow for improvements in showering stability.   Pt is able to perform 10 reps with 30STS, without UE assistance, demonstrating improvements in transferring safely and to decrease fall risk.     Total Session Time: 46 minutes  Timed Minutes: 46 minutes  Intervention(s): Therapeutic Exercise 31 minutes, Neuromuscular Re-education 15 minutes      Signature: Toribio Cluck, PT, DPT, ATC 06/01/2024 3:23-4:09

## 2024-06-03 ENCOUNTER — Ambulatory Visit
Admission: RE | Admit: 2024-06-03 | Discharge: 2024-06-03 | Disposition: A | Discharge status: Still In Hospital | Source: Ambulatory Visit | Attending: ORTHOPAEDIC SURGERY | Admitting: ORTHOPAEDIC SURGERY

## 2024-06-03 ENCOUNTER — Other Ambulatory Visit: Payer: Self-pay

## 2024-06-03 NOTE — PT Treatment (Signed)
 Outpatient Physical Therapy  [address]  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 They are both. They're tight as well. Still de-cluttering. Soreness 7-8/10.     Objective:  Therapeutic Exercise:   Upright bike (seat 5) x5'   SL shuttle presses 75# x30 R 50# x30 L     Held today:  Knee drives 3# B  Standing marches 3# B  Standing hamstring curls 3# B  DL shuttle presses 899# B  Standing hip ABD 0# B  Standing hip extensions 0# x30 B  Resisted lateral stepping BTB   Seated hip ER BTB B   Seated hip IR BTB  Lateral step ups/downs 6 B  STS (no UE assistance, 2 airex->1 airex)   Step ups/downs 6 B    Neuromuscular Re-education: (3:41-3:51)  NBOS EC foam surface x1'   Stair taps with firm and foam  NBOS foam surface ball tosses 2#     Held today:   Standing marches airex   Step ups (dyandisc)   SLS EO firm surface x1'    HEP: None    Assessment: Stair taps were added today to promote SLS tolerance. She demonstrated overall good balance on a firm surface. She reported an inconsistent buckling sensation. Therefore, pt progressed to a foam surface. More apprehension was present with this progression. Patient would continue to benefit from physical therapy to address their impairments and functional limitations. Continue physical therapy plan of care to progress towards their goals.     Plan: Progress balance.     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 MET 05/19/24  L hip flexion MMT improves to 4+/5, to allow for improvements in ambulation endurance. MET 05/19/24  R hip ABD MMT, in standing, improves to 4/5, to allow for improvements in standing endurance. NOT MET   R hip extension MMT, in standing, improves to 4+/5, to allow for improvements in ambulation endurance. MET 05/19/24  L hip IR MMT improves to 4+/5, to allow for improvements in ambulation endurance. NOT MET   L hip ER MMT improves to 4/5, to allow for improvements in ambulation endurance. MET 05/19/24  L SLS EO on a firm surface improves to 15, to allow for improvements in her showering stability. NOT MET   Long Term Goals: (By: discharge)  B hip flexion MMT improves to 5/5, to allow for improvements in ambulation endurance.   B hip ABD MMT, in standing, improves to 4+/5, to allow for improvements in standing endurance.   R hip extension MMT, in standing, improves to  5/5, to allow for improvements in ambulation endurance.   L hip IR MMT improves to 5/5, to allow for improvements in ambulation endurance.  L hip ER MMT improves to 4+/5, to allow for improvements in ambulation endurance. MET 05/19/24  B SLS EO on a firm surface improves to 30, to allow for improvements in showering stability.   Pt is able to perform 10 reps with 30STS, without UE assistance, demonstrating improvements in transferring safely and to decrease fall risk.     Total Session Time: 28 minutes  Timed Minutes: 28 minutes  Intervention(s): Therapeutic Exercise 18 minutes, Neuromuscular Re-education 10 minutes     Signature: Toribio Cluck, PT, DPT, ATC 06/03/2024 3:23-3:51

## 2024-06-08 ENCOUNTER — Ambulatory Visit (INDEPENDENT_AMBULATORY_CARE_PROVIDER_SITE_OTHER)

## 2024-06-10 ENCOUNTER — Ambulatory Visit (INDEPENDENT_AMBULATORY_CARE_PROVIDER_SITE_OTHER)

## 2024-06-15 ENCOUNTER — Ambulatory Visit (INDEPENDENT_AMBULATORY_CARE_PROVIDER_SITE_OTHER)

## 2024-06-17 ENCOUNTER — Other Ambulatory Visit: Payer: Self-pay

## 2024-06-17 ENCOUNTER — Ambulatory Visit
Admission: RE | Admit: 2024-06-17 | Discharge: 2024-06-17 | Disposition: A | Discharge status: Still In Hospital | Source: Ambulatory Visit | Attending: ORTHOPAEDIC SURGERY | Admitting: ORTHOPAEDIC SURGERY

## 2024-06-17 DIAGNOSIS — M898X5 Other specified disorders of bone, thigh: Secondary | ICD-10-CM | POA: Insufficient documentation

## 2024-06-17 NOTE — PT Treatment (Signed)
 Outpatient Physical Therapy  [address]  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 We got in at 1 o'clock from [facility] yesterday. It's sore. It's about the same. Pain 5/10 L and R 2/10.    Objective:  Therapeutic Exercise:   Upright bike (seat 5) x6'   DL shuttle presses 899# B  SL shuttle presses 75# B    SL machine knee extensions 15# B    Held today:  Knee drives 3# B  Standing marches 3# B  Standing hamstring curls 3# B  Standing hip ABD 0# B  Standing hip extensions 0# x30 B  Resisted lateral stepping BTB   Seated hip ER BTB B   Seated hip IR BTB  Lateral step ups/downs 6 B  STS (no UE assistance, 2 airex->1 airex)   Step ups/downs 6 B    Neuromuscular Re-education: (11:24-11:37)  SLS EO firm surface x1'  Stair taps with firm and foam  Bosu step ups (blue up) B    Held today:   Standing marches airex   Step ups (dyandisc)   NBOS EC foam surface x1'   NBOS foam surface ball tosses 2#     HEP: None    Assessment: SL machine knee extension exercises were performed today to improve quad strength. She attempted DL and she was putting more weight through the R. Therefore, she performed SL. She did fatigue with this on her R the more she performed it. She had significantly more difficulty on her L, with some limitations in motion present as well. Bosu step ups were added to improve her SL dynamic balance. She had significantly more difficulty on her L. Patient would continue to benefit from  physical therapy to address their impairments and functional limitations. Continue physical therapy plan of care to progress towards their goals.     Plan: Progress balance.     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 MET 05/19/24  L hip flexion MMT improves to 4+/5, to allow for improvements in ambulation endurance. MET 05/19/24  R hip ABD MMT, in standing, improves to 4/5, to allow for improvements in standing endurance. NOT MET   R hip extension MMT, in standing, improves to 4+/5, to allow for improvements in ambulation endurance. MET 05/19/24  L hip IR MMT improves to 4+/5, to allow for improvements in ambulation endurance. NOT MET   L hip ER MMT improves to 4/5, to allow for improvements in ambulation endurance. MET 05/19/24  L SLS EO on a firm surface improves to 15, to allow for improvements in  her showering stability. NOT MET   Long Term Goals: (By: discharge)  B hip flexion MMT improves to 5/5, to allow for improvements in ambulation endurance.   B hip ABD MMT, in standing, improves to 4+/5, to allow for improvements in standing endurance.   R hip extension MMT, in standing, improves to 5/5, to allow for improvements in ambulation endurance.   L hip IR MMT improves to 5/5, to allow for improvements in ambulation endurance.  L hip ER MMT improves to 4+/5, to allow for improvements in ambulation endurance. MET 05/19/24  B SLS EO on a firm surface improves to 30, to allow for improvements in showering stability.   Pt is able to perform 10 reps with 30STS, without UE assistance, demonstrating improvements in transferring safely and to decrease fall risk.     Total Session Time: 41  minutes  Timed Minutes: 41 minutes  Intervention(s): Therapeutic Exercise 28 minutes, Neuromuscular Re-education 13 minutes     Signature: Toribio Cluck, PT, DPT, ATC 06/17/2024 10:56-11:37

## 2024-06-19 ENCOUNTER — Ambulatory Visit (INDEPENDENT_AMBULATORY_CARE_PROVIDER_SITE_OTHER): Admitting: Student in an Organized Health Care Education/Training Program

## 2024-06-22 ENCOUNTER — Ambulatory Visit (INDEPENDENT_AMBULATORY_CARE_PROVIDER_SITE_OTHER)

## 2024-06-23 ENCOUNTER — Ambulatory Visit (INDEPENDENT_AMBULATORY_CARE_PROVIDER_SITE_OTHER)

## 2024-06-25 ENCOUNTER — Ambulatory Visit (INDEPENDENT_AMBULATORY_CARE_PROVIDER_SITE_OTHER)

## 2024-06-30 ENCOUNTER — Ambulatory Visit (INDEPENDENT_AMBULATORY_CARE_PROVIDER_SITE_OTHER)

## 2024-07-02 ENCOUNTER — Ambulatory Visit

## 2024-07-03 ENCOUNTER — Ambulatory Visit (INDEPENDENT_AMBULATORY_CARE_PROVIDER_SITE_OTHER)

## 2024-07-17 ENCOUNTER — Ambulatory Visit (INDEPENDENT_AMBULATORY_CARE_PROVIDER_SITE_OTHER): Admitting: Student in an Organized Health Care Education/Training Program

## 2024-08-04 ENCOUNTER — Ambulatory Visit (INDEPENDENT_AMBULATORY_CARE_PROVIDER_SITE_OTHER): Admitting: Student in an Organized Health Care Education/Training Program

## 2024-08-28 ENCOUNTER — Ambulatory Visit (INDEPENDENT_AMBULATORY_CARE_PROVIDER_SITE_OTHER): Admitting: Student in an Organized Health Care Education/Training Program

## 2024-08-28 ENCOUNTER — Ambulatory Visit (INDEPENDENT_AMBULATORY_CARE_PROVIDER_SITE_OTHER)

## 2024-08-28 ENCOUNTER — Encounter (INDEPENDENT_AMBULATORY_CARE_PROVIDER_SITE_OTHER): Payer: Self-pay | Admitting: Student in an Organized Health Care Education/Training Program

## 2024-08-28 VITALS — BP 131/82 | HR 94 | Ht 69.0 in

## 2024-08-28 DIAGNOSIS — S72402A Unspecified fracture of lower end of left femur, initial encounter for closed fracture: Secondary | ICD-10-CM

## 2024-08-28 NOTE — Progress Notes (Signed)
 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 DOS: 09/23/23       HPI: 63 year old female well-known to me now 11 months status post left femur nonunion repair.  She is here accompanied by her husband.  She reports she has made significant improvements however she continues to have symptoms related to her left lower extremity.  Her predominant complaint is pain in the medial knee and distal thigh/femur at the area of her nonunion site.  Pain is worse with prolonged standing or walking.  She still uses assistive devices outside of the house however she is able to walk independently at home.     Social History[1]    PE:   Vitals:    08/28/24 1159   BP: 131/82   Pulse: 94     Left lower extremity:  Wounds healed. No discharge and no evidence of infection.  +DF/PF foot.  Sensation intact light touch dorsal/plantar foot.  + pedal pulse  No calf pain, negative Homan's.  Painless range of motion of the knee  Tenderness about the medial knee  Mild tenderness at the lateral distal femur/nonunion site      Radiology: Xrays taken in clinic today: Multiple views of the left femur demonstrate increased fracture consolidation at the nonunion site with maintained hardware and alignment.    Impression: 63 year old female as per above    Plan: Clinically Laura Mclaughlin appears to continue to progress with her ongoing therapy and rehabilitation I provided her with an additional prescription at her request.  Radiographically her fracture does appear to be further healed then on previous imaging with potential bridging bone on all cortices however there is still some lucency and the potential for nonunion and/or implant dependent nonunion remains.  It is difficult to tell clinically if her pain is coming from her knee arthritis or from the nonunion site and I recommended a CT of the distal femur for  further categorization of her healing.  In the meantime she should continue with all activities as tolerated and I will plan to see her back after CT is completed for ongoing management. They are understanding and in agreement with the plan as discussed. All questions answered.     This note was generated within the [employer] EMR using Dragon medical speech recognition software and may contain inherent errors or omissions not intended by the user. Grammatical and punctuation errors, random word insertions, deletions, pronoun errors and incomplete sentences are occasional consequences of this technology due to software limitations. Not all errors are caught or corrected.  Although every attempt is made to root out erroneus and incomplete transcription, the note may still not fully represent the intent or opinion of the author. If there are questions or concerns about the content of this note or information contained within the body of this dictation they should be addressed directly with the author for clarification.         [1]   Social History  Tobacco Use    Smoking status: Never    Smokeless tobacco: Never   Vaping Use    Vaping status: Never Used   Substance Use Topics    Alcohol use: Never    Drug use: Never

## 2024-09-02 ENCOUNTER — Encounter (INDEPENDENT_AMBULATORY_CARE_PROVIDER_SITE_OTHER): Payer: Self-pay

## 2024-09-25 ENCOUNTER — Other Ambulatory Visit (INDEPENDENT_AMBULATORY_CARE_PROVIDER_SITE_OTHER): Payer: Self-pay

## 2024-09-25 ENCOUNTER — Ambulatory Visit (INDEPENDENT_AMBULATORY_CARE_PROVIDER_SITE_OTHER)

## 2024-09-25 ENCOUNTER — Ambulatory Visit (INDEPENDENT_AMBULATORY_CARE_PROVIDER_SITE_OTHER): Admitting: Orthopaedic Surgery

## 2024-09-25 ENCOUNTER — Encounter (INDEPENDENT_AMBULATORY_CARE_PROVIDER_SITE_OTHER): Payer: Self-pay | Admitting: Orthopaedic Surgery

## 2024-09-25 VITALS — BP 135/88 | HR 77

## 2024-09-25 DIAGNOSIS — M79606 Pain in leg, unspecified: Secondary | ICD-10-CM

## 2024-09-25 DIAGNOSIS — S8991XA Unspecified injury of right lower leg, initial encounter: Secondary | ICD-10-CM

## 2024-09-25 NOTE — Progress Notes (Signed)
 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 DOS: 09/23/23     Presenting problem: Acute Complicated injury (requiring appropriate multi-system eval)    HPI:  Laura Mclaughlin is a 63 y.o. 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  This past Monday, she almost fell due to her right knee giving out.  Denies any other acute trauma or injury to the bilateral lower extremities.  She has noticed progressive weakening of her right lower extremity and thinks that it could possibly be from overcompensating for the left side.  She has questions about possible hardware removal and revision for the left femur if there is continued concerns for bone healing.  Nowadays, she is able to ambulate without the need of any assistive devices for the most however there are instances where she needs to gain additional support with her cane.  Accompanied by husband on this visit.    Physical Exam:   Vitals:    09/25/24 1106   BP: 135/88   Pulse: 77   SpO2: 96%     Left lower extremity:  Wounds healed. No discharge and no evidence of infection.  +DF/PF foot.  Sensation intact light touch dorsal/plantar foot.  + pedal pulse  No calf pain, negative Homan's.  Shorter limb length compared to contralateral    Right lower extremity:  Wounds healed. No discharge and no evidence of infection.  +DF/PF foot.  Sensation intact light touch dorsal/plantar foot.  + pedal pulse  No calf pain, negative Homan's.      Radiology:     Xrays taken in clinic today (Body part/Views/Reading): Right knee.  On AP and lateral views, there is demonstration of intact hardware in the tibial plateau with no other acute signs of fracture or dislocation.  Knee joint without any significant narrowing or signs of  arthritis at this time.  No other pathology seen.    CT LLE 11/20: On coronal, axial, sagittal views, there is demonstration of intact hardware with some signs of incomplete healing of the distal femur surrounding the hardware    Previous x-rays, images and reports, were reviewed for comparison of healing, alignment.    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     Plan:   Ongoing fx/dislocation mgment w/ appropriate consideration of immobilization, advance of WB, ROM, return to work/activities, and consideration of risk of refracture/re-injury  I went over the radiographs diagnosis and treatment options with the patient.   WBAT to BLE.     Attending Addendum/Attestation:    I have personally seen and examined this patient and have participated in their care. I have read and agree with the clinical information, including the physical exam and patient history as documented by the Resident.  The above note has been edited to reflect my corrections and findings, incorporate supplementary history and exam, and edit the Assessment and Plan as needed.    Agree with above.  CT of left knee reviewed and interpreted by myself.  This demonstrates healing of her left distal femoral shaft nonunion with no evidence of any fracture lines.  Right x-ray of the  knee obtained today in clinic and reviewed interpreted myself.  This demonstrates no significant joint space narrowing.  Her hardware is intact and her tibial plateau is fully healed.  Lengthy discussion with the patient regarding her clinical exam, radiographic findings, diagnosis, treatment plan.  On my examination she does have significant medial joint line tenderness on the right knee consistent with meniscal pathology especially given her right knee giving out and mechanical symptoms.  I have ordered an MRI without contrast to further evaluate for meniscal  pathology.  Also discussed possible injection of the right knee.  She would like to hold off on this until further understanding of her right knee pain.  I think this is reasonable.  Also discussed possible conversion of her left knee to a total knee arthroplasty given that she has medial sided knee arthritis that can be causing her left knee varus and shortening and pain with ambulation.  We will follow-up with her after MRI results obtained.  Will discuss with my partner Dr. Gerold as well.    Dorn Sic, MD  Orthopaedic Trauma  Office [phone]      Xrays at the next visit: None    -------------------------------------------------------------------  Risk of Problem/Management: Moderate     Laura Mclaughlin was seen today for leg injury.    Diagnoses and all orders for this visit:    Injury of right knee, initial encounter  -     XR Knee 1 Or 2 Views Right        ----------------------------------------------------------------------------------------------------------  The review of the patient's medications does not in any way constitute an endorsement, by this clinician,  of their use, dosage, indications, route, efficacy, interactions, or other clinical parameters.

## 2024-10-14 ENCOUNTER — Encounter (INDEPENDENT_AMBULATORY_CARE_PROVIDER_SITE_OTHER): Payer: Self-pay

## 2024-10-14 ENCOUNTER — Other Ambulatory Visit: Payer: Self-pay

## 2024-10-14 ENCOUNTER — Ambulatory Visit (INDEPENDENT_AMBULATORY_CARE_PROVIDER_SITE_OTHER)
Admission: RE | Admit: 2024-10-14 | Discharge: 2024-10-14 | Disposition: A | Discharge status: Still In Hospital | Payer: Self-pay | Source: Ambulatory Visit

## 2024-10-14 DIAGNOSIS — M80052K Age-related osteoporosis with current pathological fracture, left femur, subsequent encounter for fracture with nonunion: Secondary | ICD-10-CM

## 2024-10-14 DIAGNOSIS — M79606 Pain in leg, unspecified: Secondary | ICD-10-CM | POA: Insufficient documentation

## 2024-10-14 DIAGNOSIS — M898X5 Other specified disorders of bone, thigh: Secondary | ICD-10-CM

## 2024-10-14 DIAGNOSIS — M79604 Pain in right leg: Secondary | ICD-10-CM | POA: Insufficient documentation

## 2024-10-14 DIAGNOSIS — M79605 Pain in left leg: Secondary | ICD-10-CM | POA: Insufficient documentation

## 2024-10-14 DIAGNOSIS — M25562 Pain in left knee: Secondary | ICD-10-CM

## 2024-10-14 DIAGNOSIS — M25561 Pain in right knee: Secondary | ICD-10-CM

## 2024-10-14 NOTE — PT Evaluation (Signed)
 Outpatient Physical Therapy  [address]  [place], [place] [address]  [phone]  (Fax) [phone]    Physical Therapy Evaluation    Date: 10/14/2024  Patient's Name: Jacqueline Norris  Date of Birth: 08-12-1961  Physical Therapy Evaluation     PT diagnosis/Reason for Referral: Leg pain [M79.606]    Insurance: BCBS    Authorized Visits: 1/65         SUBJECTIVE  Subjective: This started about 3-4 weeks ago. I was just walking and the pain was like someone stuck a knife thorugh me. It buckled.    History of Present Illness: She last saw Dr. Gerold 2 weeks ago, on 10/16/24. They reported all the hardware was in place after an X-ray was taken. The MRI is 11/02/24. Her last fall was last week. She was standing at the sink. It buckled and she hit the floor backwards. She hit her head and her neck was hurting. Last night her neck was hurting. It's okay today. It's all localized. No other MOI other than what happened 3-4 weeks ago.     Medications:   Epilepsy medication change    Past Medical History:  No changes    Past Surgical History:   No changes     Activities of Daily Living: She had a buckling episode in the shower. It buckles all the time and she catches herself with the canes. Now she doesn't walk without the canes.     Occupation: She's not currently working.     Living Situation: Living situation is the same as it was last time. She's hasn't attempted stairs. She has a fear and doesn't think she'd be able to navigate them.     Pain: Pain is throughout the knees. She described it as someone taking baseball bats on a regular basis. It's localized. Numbness throughout legs, but that's been consistent. Laying and turning the leg can increase pain. This affects her sleep. Staying off of it decreases pain.     Fall Risk: 1 fall, but many buckling epsidoes, so yes.          OBJECTIVE  Palpation: Most of her pain is on the medial, lateral, and posterior aspect of her R leg. Pt TTP along distal quad  on L.     Observation: No drastic difference when compared B for knees. Swelling in B knees present.     AROM:   Knee  Flexion R 120   L 120    Extension R 0  L 2  he     Strength:   Hip  Flexion R 5/5 L 5/5     Knee  Flexion R 5/5  L 5/5   Extension R 4+/5 L 4+/5      Functional Testing:   30 STS with UE assistance 6 reps     Gait: Pt ambulates with B SPC. She ambulated slowly with intermittent buckling/pain. Because of this, stairs weren't attempted.         ASSESSMENT/PLAN  Problem List: Problem List[1]    Assessment: Patient is a 63 year old female who reports to the re-evaluation with a diagnosis of leg pain. Impairments include weakness with B knee extension. Functional limitations include buckling intermittently, which increases her fall risk and limits her ability to do ADLs safely. Skilled care is medically necessary in order to address the listed impairments and functional limitations through the given POC, utilizing: therapeutic exercise, therapeutic activity, neuromuscular re-education, gait training, manual therapy, and modalities, as needed, for  pain management. Patient would benefit from physical therapy services at a frequency of 2x/week for 90 days with a fair prognosis pending compliance with physical therapy to achieve the goals listed below and to maximize their quality of life.     Rehab potential: FAIR    Short Term Goals: (By: 10 visits)  Patient will be independent with HEP for ROM/flexibility and strengthening for increased ease of ADLs.  Pt is able to perform 10 reps for 30 STS, with UE assistance, demonstrating improvements in functional quad strength and safe transferring ability.   Long Term Goals: (By: discharge)  Pt is able to perform 10 reps for 30 STS, without UE assistance, demonstrating improvements in functional quad strength and safe transferring ability.   B knee extension MMT improves to 5/5, to help to decrease the likelihood of buckling, especially when completing  ADLs.    Plan for next visit: Add bike.     Total session time: 38 minutes  Untimed minutes: 38 minutes  Timed minutes: 0 minutes  Intervention minutes: Re-evaluation 38 minutes    Signature: Toribio Cluck, PT, DPT, ATC 10/14/2024 10:56-11:34        Certification: 10/14/2024-01/12/25    I certify the need for these services furnished under this plan of treatment and while under my care.    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

## 2024-10-19 ENCOUNTER — Encounter (INDEPENDENT_AMBULATORY_CARE_PROVIDER_SITE_OTHER): Payer: Self-pay | Admitting: Student in an Organized Health Care Education/Training Program

## 2024-10-21 ENCOUNTER — Ambulatory Visit (INDEPENDENT_AMBULATORY_CARE_PROVIDER_SITE_OTHER): Payer: Self-pay

## 2024-10-27 ENCOUNTER — Other Ambulatory Visit: Payer: Self-pay

## 2024-10-27 ENCOUNTER — Ambulatory Visit
Admission: RE | Admit: 2024-10-27 | Discharge: 2024-10-27 | Disposition: A | Discharge status: Still In Hospital | Payer: Self-pay | Source: Ambulatory Visit | Attending: ORTHOPAEDIC SURGERY | Admitting: ORTHOPAEDIC SURGERY

## 2024-10-27 DIAGNOSIS — M80052K Age-related osteoporosis with current pathological fracture, left femur, subsequent encounter for fracture with nonunion: Secondary | ICD-10-CM

## 2024-10-27 DIAGNOSIS — M79606 Pain in leg, unspecified: Secondary | ICD-10-CM

## 2024-10-27 DIAGNOSIS — M898X5 Other specified disorders of bone, thigh: Secondary | ICD-10-CM

## 2024-10-27 DIAGNOSIS — M25562 Pain in left knee: Secondary | ICD-10-CM

## 2024-10-27 DIAGNOSIS — M25561 Pain in right knee: Secondary | ICD-10-CM

## 2024-10-27 NOTE — PT Treatment (Signed)
 Outpatient Physical Therapy  [address]  [place], [place] [address]  [phone]  (Fax) [phone]    Physical Therapy Treatment Note    Date: 10/27/2024  Patient's Name: Jacqueline Norris  Date of Birth: 1961/07/15  Physical Therapy Visit    Visit #/POC: 2  Authorized Visits: 2/65  Progress Note Due at Visit Number: 10  POC Ends: 01/12/25  Insurance: BCBS  PT Diagnosis/Reason for Referral: Leg pain [M79.606]     Subjective: The knees buckle the same. It buckled again this morning. The knees haven't been feeling good. I go for my MRI Monday. It's been buckling consistently. It's not improved. It's consistently getting worse.    Objective:  Therapeutic Exercise:   Upright bike (seat 5) x10'   DL shuttle presses 37.4#   SL shuttle presses 37.5# B  LAQs 3# B   Seated hamstring curls BTB   STS (1 airex)     HEP: None    Assessment: Various light strengthening exercises were added today to improve hip and knee strength. STS were added today to progress towards unmet goals. Patient would continue to benefit from physical therapy to address their impairments and functional limitations. Continue physical therapy plan of care to progress towards their goals.     Plan: Provide HEP.    Problem List[1]    Goals:   Short Term Goals: (By: 10 visits)  Patient will be independent with HEP for ROM/flexibility and strengthening for increased ease of ADLs.  Pt is able to perform 10 reps for 30 STS, with UE assistance, demonstrating improvements in functional quad strength and safe transferring ability.   Long Term Goals: (By: discharge)  Pt is able to perform 10 reps for 30 STS, without UE assistance, demonstrating improvements in functional quad strength and safe transferring ability.   B knee extension MMT improves to 5/5, to help to decrease the likelihood of buckling, especially when completing ADLs.    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

## 2024-11-02 ENCOUNTER — Ambulatory Visit
Admission: RE | Admit: 2024-11-02 | Discharge: 2024-11-02 | Disposition: A | Discharge status: Home / Self Care | Source: Ambulatory Visit | Attending: Orthopaedic Surgery | Admitting: Orthopaedic Surgery

## 2024-11-02 DIAGNOSIS — S8991XA Unspecified injury of right lower leg, initial encounter: Secondary | ICD-10-CM | POA: Insufficient documentation

## 2024-11-03 ENCOUNTER — Ambulatory Visit (INDEPENDENT_AMBULATORY_CARE_PROVIDER_SITE_OTHER): Payer: Self-pay

## 2024-11-10 ENCOUNTER — Ambulatory Visit (INDEPENDENT_AMBULATORY_CARE_PROVIDER_SITE_OTHER): Payer: Self-pay

## 2024-11-10 ENCOUNTER — Ambulatory Visit (INDEPENDENT_AMBULATORY_CARE_PROVIDER_SITE_OTHER): Admitting: Student in an Organized Health Care Education/Training Program

## 2024-11-10 VITALS — BP 125/84 | HR 84

## 2024-11-10 DIAGNOSIS — Z6841 Body Mass Index (BMI) 40.0 and over, adult: Secondary | ICD-10-CM | POA: Insufficient documentation

## 2024-11-10 DIAGNOSIS — D4819 Other specified neoplasm of uncertain behavior of connective and other soft tissue: Secondary | ICD-10-CM

## 2024-11-10 NOTE — Progress Notes (Signed)
 Orthpaedic Trauma Follow Up Visit    No chief complaint on file.      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  She is accompanied by her husband.  She continues to have symptoms related to right knee.  She reports that it feels like her leg occasionally gives out on her and has periods of pain in the back of her knee.  She feels that this is getting worse.  No chest pain shortness of breath fevers chills nausea vomiting.    Social History[1]    PE: There were no vitals filed for this visit.  Right lower extremity:  Wounds healed. No discharge and no evidence of infection.  Tenderness to palpation posterior knee  +DF/PF foot.  Sensation intact light touch dorsal/plantar foot.  + pedal pulse  No calf pain, negative Homan's.    Radiology: Xrays taken in clinic today: None    MRI of the right knee was reviewed:  1.Complex lobulated ovoid soft tissue mass with internal septation and macroscopic fat signal in the deep fascial planes of  the right distal posterior thigh, posterior to the superficial femoral vessels and tibial/common peroneal nerves. 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 Image guided tissue sampling is recommended to further evaluate.  2.The examination is limited due to metallic artifacts arising from fixation hardware in the proximal tibia despite metal artifact reduction protocol.  3.Moderate joint effusion and mild synovitis.  4.Mild degenerative truncation in the free margins of both medial and lateral menisci without a prominent displaced meniscal tear.  5.Grade 2-3 cartilage loss in all 3 compartments.  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    Impression: 64 year old female as per above    Plan: I went over the injury, diagnosis, radiographs, and  treatment plan with the patient/family.  I discussed the MRI findings with the patient as well as her clinical examination.  We discussed her posterior knee mass and my recommendation for evaluation and treatment by Dr. Delynn, orthopedic oncologist.  I provided his information to the patient and her husband and informed them to make an appointment.  She can otherwise continue with activities as tolerated.  I will continue to follow up with her regarding her left knee pain once her workup and evaluation with regards to her right posterior knee mass has been completed.They are understanding and in agreement with the plan as discussed. All questions answered.     Xrays next visit: None           [1]   Social History  Tobacco Use    Smoking status: Never    Smokeless tobacco: Never   Vaping Use    Vaping status: Never Used   Substance Use Topics    Alcohol use: Never    Drug use: Never

## 2024-11-11 ENCOUNTER — Telehealth (INDEPENDENT_AMBULATORY_CARE_PROVIDER_SITE_OTHER): Payer: Self-pay

## 2024-11-11 NOTE — Telephone Encounter (Signed)
 Staff called and requested for MRI report, Office note 11/11/24 sent to Dr Starling at [phone].

## 2024-11-13 ENCOUNTER — Ambulatory Visit (INDEPENDENT_AMBULATORY_CARE_PROVIDER_SITE_OTHER): Payer: Self-pay

## 2024-11-16 ENCOUNTER — Ambulatory Visit
Admission: RE | Admit: 2024-11-16 | Discharge: 2024-11-16 | Disposition: A | Discharge status: Still In Hospital | Payer: Self-pay | Source: Ambulatory Visit | Attending: ORTHOPAEDIC SURGERY | Admitting: ORTHOPAEDIC SURGERY

## 2024-11-16 ENCOUNTER — Other Ambulatory Visit: Payer: Self-pay

## 2024-11-16 DIAGNOSIS — M79606 Pain in leg, unspecified: Secondary | ICD-10-CM

## 2024-11-16 DIAGNOSIS — M898X5 Other specified disorders of bone, thigh: Secondary | ICD-10-CM

## 2024-11-16 DIAGNOSIS — M25562 Pain in left knee: Secondary | ICD-10-CM

## 2024-11-16 DIAGNOSIS — M25561 Pain in right knee: Secondary | ICD-10-CM

## 2024-11-16 DIAGNOSIS — M80052K Age-related osteoporosis with current pathological fracture, left femur, subsequent encounter for fracture with nonunion: Secondary | ICD-10-CM

## 2024-11-16 NOTE — PT Treatment (Signed)
 Outpatient Physical Therapy  [address]  [place], [place] [address]  [phone]  (Fax) [phone]    Physical Therapy Discharge Note    Date: 11/16/2024  Patient's Name: Jacqueline Norris  Date of Birth: 1961-10-27  Physical Therapy Visit    Visit #/POC: 3  Authorized Visits: 3/65  Progress Note Due at Visit Number: 10  POC Ends: 01/12/25  Insurance: [employer]BS  PT Diagnosis/Reason for Referral: Leg pain [M79.606]     Subjective: I'm okay today. The legs are the same. They haven't changed. 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 The plan, he referred me to an orthopedic oncology surgeon. He thinks that's why the knee is buckling. He thinks it's pressing on the nerve and arteries. It's relatively the same size. I can't walk. I see the orthopedic oncologist on 11/20/24.    Objective:  Therapeutic Exercise: (held today)   Upright bike (seat 5) x10'   DL shuttle presses 37.4#   SL shuttle presses 37.5# B  LAQs 3# B   Seated hamstring curls BTB   STS (1 airex)     HEP: None    Assessment: Due to what pt reported to start today, it was determined that pt be discharged from PT at this time. Once they better determine what to do with what was found, then she could consider PT. However, no PT is recommended at this time.     Plan: N/A    Problem List[1]    Goals:   Short Term Goals: (By: 10 visits)  Patient will be independent with HEP for ROM/flexibility and strengthening for increased ease of ADLs.  Pt is able to perform 10 reps for 30 STS, with UE assistance, demonstrating improvements in functional quad strength and safe transferring ability.   Long Term Goals: (By: discharge)  Pt is able to perform 10 reps for 30 STS, without UE assistance, demonstrating improvements in functional quad strength and safe transferring ability.   B knee extension MMT improves to 5/5, to help to decrease the likelihood of buckling, especially when completing ADLs.    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

## 2024-11-20 ENCOUNTER — Ambulatory Visit (INDEPENDENT_AMBULATORY_CARE_PROVIDER_SITE_OTHER): Payer: Self-pay

## 2024-11-23 ENCOUNTER — Ambulatory Visit (INDEPENDENT_AMBULATORY_CARE_PROVIDER_SITE_OTHER): Payer: Self-pay

## 2024-11-27 ENCOUNTER — Ambulatory Visit (INDEPENDENT_AMBULATORY_CARE_PROVIDER_SITE_OTHER): Payer: Self-pay

## 2024-11-30 ENCOUNTER — Ambulatory Visit (INDEPENDENT_AMBULATORY_CARE_PROVIDER_SITE_OTHER): Payer: Self-pay

## 2024-12-01 ENCOUNTER — Encounter (INDEPENDENT_AMBULATORY_CARE_PROVIDER_SITE_OTHER): Payer: Self-pay | Admitting: Student in an Organized Health Care Education/Training Program

## 2024-12-02 ENCOUNTER — Ambulatory Visit (INDEPENDENT_AMBULATORY_CARE_PROVIDER_SITE_OTHER)

## 2024-12-02 ENCOUNTER — Encounter (INDEPENDENT_AMBULATORY_CARE_PROVIDER_SITE_OTHER): Payer: Self-pay

## 2024-12-02 NOTE — PSS Phone Screening (Signed)
 Pre-Anesthesia Evaluation    Pre-op phone visit requested by:   Reason for pre-op phone visit: Patient anticipating EXCISION TUMOR, POSTERIOR RIGHT KNEE procedure.     No orders of the defined types were placed in this encounter.    10/2024 PCP LOV  - [facility] sent requesting office notes and labs for review  08/06/24 Neuro LOV (in [place])     History of Present Illness/Summary:      Problem List:  Medical Problems       Hospital Problem List  Date Reviewed: 09/25/2024   None        Non-Hospital Problem List  Date Reviewed: 09/25/2024          ICD-10-CM Priority Class Noted Diagnosed    Seizure disorder (CMS/HCC) G40.909   05/30/2021     Increasing frequency of seizure activity (CMS/HCC) R56.9   06/01/2021     Closed fracture of left distal femur (CMS/HCC) S72.402A   06/09/2022     Tibial plateau fracture, right, closed, initial encounter S82.141A   06/11/2022     Closed displaced fracture of second cervical vertebra (CMS/HCC) S12.100A   06/11/2022     Other intraarticular fracture of lower end of right radius, initial encounter for closed fracture S52.571A   06/11/2022     Closed bicondylar fracture of distal femur, left, with nonunion, subsequent encounter S72.422K, D27.567X   09/05/2022     Painful orthopaedic hardware T84.84XA   01/08/2023     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   09/17/2023     History of blood transfusion Z92.89   09/19/2023     Overview Signed 09/19/2023  2:48 PM by Emilio Rocky RAMAN, DNP FNP   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; last 09/2022; no hx of transfusion rxn          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   09/19/2023     Obesity (BMI 30.0-34.9) E66.811   09/19/2023     Acid reflux K21.9   09/19/2023     Hypothyroidism E03.9   09/19/2023     PONV (postoperative nausea and vomiting) R11.2, Z98.890   09/19/2023     Difficulty walking R26.2   Unknown     Depression F32.A   Unknown     Closed fracture of distal end of left femur, unspecified fracture morphology, initial encounter  (CMS/HCC) S72.402A   09/23/2023     Body mass index (BMI) 40.0-44.9, adult (CMS/HCC) Z68.41   11/10/2024         Medical History   Diagnosis Date    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 Always use your most recent med list.                * Acetaminophen  Extra Strength 500 MG Tabs  TAKE 2 TABLETS (1,000 MG) BY MOUTH EVERY 8 (EIGHT) HOURS FOR 14 DAYS  Medication Adjustments for Surgery: Take as prescribed     * acetaminophen  500 MG tablet  Take 2 tablets (1,000 mg) by mouth every 8 (eight) hours for 14 days  Commonly known as: TYLENOL   Medication Adjustments for Surgery: Take as prescribed     Aspirin  Adult Low Strength 81 MG EC tablet  TAKE 1 TABLET (81 MG) BY MOUTH EVERY 12 (TWELVE) HOURS  Generic drug: aspirin  EC  Medication Adjustments for Surgery: Take as prescribed     AUVELITY PO  Take 100 mg by mouth 2 (two) times daily  Medication Adjustments for Surgery: Take as prescribed     cloBAZam  20 MG Tabs  Take 1 tablet (20 mg) by mouth nightly  Medication Adjustments for Surgery: Take as prescribed      Cyanocobalamin  1000 MCG/ML Kit  Inject as directed every 14 (fourteen) days  Medication Adjustments for Surgery: Hold day of surgery     Desvenlafaxine  ER 100 MG Tb24  Take 1 tablet (100 mg) by mouth daily  Medication Adjustments for Surgery: Take as prescribed     gabapentin  300 MG capsule  TAKE 1 CAPSULE BY MOUTH EVERY 8 HOURS FOR 14 DAYS  Commonly known as: NEURONTIN   Medication Adjustments for Surgery: Take as prescribed     LamoTRIgine  200 MG Tb24  Take 1 tablet (200 mg) by mouth 2 (two) times daily  Medication Adjustments for Surgery: Take as prescribed     levothyroxine  75 MCG tablet  Take 1 tablet (75 mcg) by mouth Once a day at 6:00am  Commonly known as: SYNTHROID   Medication Adjustments for Surgery: Take as prescribed     LORazepam  0.5 MG tablet  Take 1 tablet (0.5 mg) by mouth 3 (three) times daily  Commonly known as: ATIVAN   Medication Adjustments for Surgery: Take as prescribed     Methocarbamol  1000 MG Tabs  Take 1 tablet (1,000 mg) by mouth 4 (four) times daily  Medication Adjustments for Surgery: Take as prescribed     multivitamin Liqd  Take 5 mLs by mouth 2 (two) times daily  Medication Adjustments for Surgery: Hold day of surgery     naloxone  4 MG/0.1ML nasal spray  1 spray intranasally. If pt does not respond or relapses into respiratory depression call 911. Give additional doses every 2-3 min.  Commonly known as: NARCAN      ondansetron  4 MG disintegrating tablet  Take 1 tablet (4 mg) by mouth every 8 (eight) hours as needed for Nausea  Commonly known as: ZOFRAN -ODT  Medication Adjustments for Surgery: Take as prescribed     oxyCODONE  5 MG immediate release tablet  TAKE 1 TABLET (5 MG) BY MOUTH EVERY 4 (FOUR) HOURS AS NEEDED FOR PAIN  Commonly known as: ROXICODONE   Medication Adjustments for Surgery: Take as prescribed     pantoprazole  40 MG tablet  Take 1 tablet (40 mg) by mouth as needed  Commonly known as: PROTONIX   Medication  Adjustments for Surgery: Take as prescribed     Peri-Colace  8.6-50 MG per tablet  TAKE 2 TABLETS BY MOUTH 2 (TWO) TIMES DAILY AS NEEDED FOR CONSTIPATION  Generic drug: senna-docusate     rotigotine  3 MG/24HR Pt24 patch  Place 1 patch (3 mg) onto the skin every 24 hours  Commonly known as: NEUPRO      topiramate  50 MG tablet  Take 3 tablets (150 mg) by mouth 2 (two) times daily Takes 150 mgs twice a day  Commonly known as: TOPAMAX   Medication Adjustments for Surgery: Take as prescribed     traZODone  300 MG tablet  Take 1 tablet (300 mg) by mouth nightly  Commonly known as: DESYREL   Medication Adjustments for Surgery: Take as prescribed     vitamin D  (ergocalciferol ) 50000 UNIT Caps  Take 1 capsule (50,000 Units) by mouth once a week  Commonly known as: DRISDOL   Medication Adjustments for Surgery: Hold day of surgery     zonisamide 100 MG capsule  TAKE 1 CAPSULE BY MOUTH EVERY MORNING AND 5 CAPSULES AT NIGHT  Commonly known as: ZONEGRAN  Medication Adjustments for Surgery: Take as prescribed           * This list has 2 medication(s) that are the same as other medications prescribed for you. Read the directions carefully, and ask your doctor or other care provider to review them with you.                Allergies[2]  Family History[3]  Social History     Occupational History    Not on file   Tobacco Use    Smoking status: Never    Smokeless tobacco: Never   Vaping Use    Vaping status: Never Used   Substance and Sexual Activity    Alcohol use: Never    Drug use: Never    Sexual activity: Not on file       Menstrual History:   LMP / Status  Postmenopausal     No LMP recorded. Patient is postmenopausal.    Tubal Ligation?  No valid surgical or medical questions entered.               Exam Scores:   SDB score  OSA Risk Category: No Risk        STBUR score       PONV score  Nausea Risk: VERY SEVERE RISK    MST score  MST Score: 0    PEN-FAST score       Frailty score  CFS Score: 4    CHADsVasc            Visit Vitals  Ht 1.753 m (5' 9)   Wt 91.6 kg (202 lb)   BMI 29.83 kg/m    Overweight based on BMI.                                       [1]   Past Surgical History:  Procedure Laterality Date    APPLICATION, EXTERNAL FIXATOR, UPPER EXTREMITY Right 06/14/2022    Procedure: APPLICATION, EXTERNAL FIXATOR, UPPER EXTREMITY;  Surgeon: Merton Ozell LABOR, MD;  Location: [place];  Service: Orthopedics;  Laterality: Right;    CHOLECYSTECTOMY      GASTRIC BYPASS N/A     HERNIA REPAIR      x3    OPEN TREATMENT, FEMORAL FRACTURE, DISTAL, MEDIAL  OR LATERAL CONDYLE, INTERNAL FIXATION Left 06/11/2022    Procedure: ORIF, FEMUR, DISTAL;  Surgeon: Nellie Lamar LABOR, MD;  Location: [place] [facility];  Service: Orthopedics;  Laterality: Left;    ORIF, RADIUS, DISTAL (WRIST) Right 06/26/2022    Procedure: ORIF, RIGHT RADIUS, DISTAL (WRIST);  Surgeon: Jerone Chyrl BRAVO, MD;  Location: [facility];  Service: Orthopedics;  Laterality: Right;    ORIF, TIBIA PLATEAU Right 06/14/2022    Procedure: ORIF, TIBIA PLATEAU RIGHT;  Surgeon: Merton Ozell LABOR, MD;  Location: [place] [facility];  Service: Orthopedics;  Laterality: Right;    REMOVAL, HARDWARE, COMPLEX, NON-SPINE Left 09/05/2022    Procedure: LEFT LOWER EXTREMITY HARDWARE REMOVAL;  Surgeon: Gerold Balo, MD;  Location: [facility] [facility];  Service: Orthopedics;  Laterality: Left;    REMOVAL, HARDWARE, COMPLEX, NON-SPINE Left 01/25/2023    Procedure: LEFT FEMUR REMOVAL OF HARDWARE;  Surgeon: Gerold Balo, MD;  Location: [facility];  Service: Orthopedics;  Laterality: Left;    REMOVE, MODIFY, UPPER EXTREMITY EXTERNAL FIXATOR Right 06/26/2022    Procedure: REMOVAL EXTERNAL FIXATOR;  Surgeon: Jerone Chyrl BRAVO, MD;  Location: [id number];  Service: Orthopedics;  Laterality: Right;    REPAIR, NONUNION, FEMUR W/ GRAFT Left 09/23/2023    Procedure: LEFT REPAIR, NONUNION, FEMUR W/ GRAFT;  Surgeon: Gerold Balo, MD;  Location: [facility] [place] [facility];  Service: Orthopedics;  Laterality: Left;    REPAIR, NONUNION, FEMUR, NO GRAFT Left 09/05/2022     Procedure: LEFT DISTAL FEMUR NONUNION REPAIR WITH IM NAIL;  Surgeon: Gerold Balo, MD;  Location: [place] [facility];  Service: Orthopedics;  Laterality: Left;    WISDOM TOOTH EXTRACTION     [2]   Allergies  Allergen Reactions    Reglan  [Metoclopramide ] Other (See Comments)     Tardive dyskinesia    Blood-Group Specific Substance      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   [3]   Family History  Problem Relation Name Age of Onset    Hypertension Mother      Diabetes Mother

## 2024-12-04 ENCOUNTER — Ambulatory Visit (INDEPENDENT_AMBULATORY_CARE_PROVIDER_SITE_OTHER): Payer: Self-pay

## 2024-12-07 ENCOUNTER — Ambulatory Visit (INDEPENDENT_AMBULATORY_CARE_PROVIDER_SITE_OTHER): Payer: Self-pay

## 2024-12-09 ENCOUNTER — Telehealth (INDEPENDENT_AMBULATORY_CARE_PROVIDER_SITE_OTHER): Payer: Self-pay

## 2024-12-09 NOTE — Addendum Note (Signed)
 Addended by: LUKE LIS on: 12/09/2024 11:47 AM     Modules accepted: Orders

## 2024-12-09 NOTE — Telephone Encounter (Signed)
 PSS RN Nav Note:    Chart reviewed - per PCP records in [facility], GLP-1 injection listed in medication list.    Outgoing call to patient regarding DOS 12/11/24 - patient states she did not disclose this during PSS phone interview on 12/02/24. Patient states LD of zepbound 15mg /0.55mL was 2 weeks ago.     Special GLP-1 fasting instructions provided and sent to patient via [facility] portal. Patient verbalizes understanding and no further questions at this time.

## 2024-12-10 ENCOUNTER — Encounter: Payer: Self-pay | Admitting: Orthopaedic Surgery

## 2024-12-10 NOTE — Anesthesia Preprocedure Evaluation (Signed)
 Anesthesia Evaluation    AIRWAY    Mallampati: II    TM distance: >3 FB  Neck ROM: full  Mouth Opening:full   CARDIOVASCULAR    cardiovascular exam normal       DENTAL    no notable dental hx               PULMONARY    pulmonary exam normal     OTHER FINDINGS    Labs reviewed:   ECG reviewed:      Tirzepatide    Prone procedure                                    Relevant Problems   ANESTHESIA   (+) PONV (postoperative nausea and vomiting)      NEURO/PSYCH   (+) Increasing frequency of seizure activity (CMS/HCC)   (+) Seizure disorder (CMS/HCC)      GI   (+) Acid reflux      ENDO   (+) Hypothyroidism               Anesthesia Plan    ASA 3     general                     Detailed anesthesia plan: general endotracheal        Post op pain management: per surgeon    informed consent obtained    Plan discussed with CRNA.    ECG reviewed  pertinent labs reviewed               Signed by: [place] JULIANNA Santos, MD PhD 12/10/24 2:00 PM

## 2024-12-11 ENCOUNTER — Ambulatory Visit (INDEPENDENT_AMBULATORY_CARE_PROVIDER_SITE_OTHER): Payer: Self-pay

## 2024-12-11 ENCOUNTER — Encounter: Admission: RE | Payer: Self-pay | Source: Ambulatory Visit

## 2024-12-11 ENCOUNTER — Ambulatory Visit: Admission: RE | Admit: 2024-12-11 | Source: Ambulatory Visit | Admitting: Orthopaedic Surgery

## 2024-12-11 ENCOUNTER — Encounter: Payer: Self-pay | Admitting: Anesthesiology
